# Patient Record
Sex: Female | Born: 1945 | Race: White | Hispanic: No | Marital: Married | Smoking: Never smoker
Health system: Southern US, Community
[De-identification: ages and names within clinical notes are randomized; demographics above are authoritative.]

## PROBLEM LIST (undated history)

## (undated) DIAGNOSIS — I2699 Other pulmonary embolism without acute cor pulmonale: Secondary | ICD-10-CM

## (undated) DIAGNOSIS — S32020A Wedge compression fracture of second lumbar vertebra, initial encounter for closed fracture: Secondary | ICD-10-CM

## (undated) DIAGNOSIS — R7303 Prediabetes: Secondary | ICD-10-CM

## (undated) DIAGNOSIS — I700 Atherosclerosis of aorta: Secondary | ICD-10-CM

## (undated) DIAGNOSIS — I10 Essential (primary) hypertension: Secondary | ICD-10-CM

## (undated) DIAGNOSIS — T8859XA Other complications of anesthesia, initial encounter: Secondary | ICD-10-CM

## (undated) DIAGNOSIS — I4510 Unspecified right bundle-branch block: Secondary | ICD-10-CM

## (undated) DIAGNOSIS — M48061 Spinal stenosis, lumbar region without neurogenic claudication: Secondary | ICD-10-CM

## (undated) DIAGNOSIS — G709 Myoneural disorder, unspecified: Secondary | ICD-10-CM

## (undated) DIAGNOSIS — E785 Hyperlipidemia, unspecified: Secondary | ICD-10-CM

## (undated) DIAGNOSIS — R911 Solitary pulmonary nodule: Secondary | ICD-10-CM

## (undated) HISTORY — PX: CATARACT EXTRACTION W/ INTRAOCULAR LENS IMPLANT: SHX1309

## (undated) HISTORY — PX: WRIST FRACTURE SURGERY: SHX121

## (undated) HISTORY — PX: ANKLE FRACTURE SURGERY: SHX122

## (undated) HISTORY — PX: TONSILLECTOMY: SUR1361

## (undated) HISTORY — PX: EYE SURGERY: SHX253

## (undated) HISTORY — PX: LUMBAR FUSION: SHX111

---

## 2005-10-25 HISTORY — PX: WRIST SURGERY: SHX841

## 2006-10-01 ENCOUNTER — Emergency Department (HOSPITAL_COMMUNITY): Admission: EM | Admit: 2006-10-01 | Discharge: 2006-10-01 | Payer: Self-pay | Admitting: Emergency Medicine

## 2006-10-01 IMAGING — CT CT L SPINE W/O CM
3 of 4 series · 15 of 33 positions shown, 18 images · non-contrast
Comparison: none
COMPARISON: none
COMPARISON: none

CLINICAL DATA: Patient fell off a horse.
 RIGHT WRIST- 4 VIEWS:
 There is a Colles type fracture of the distal radius with dorsal impaction and slight displacement.  There is also a nondisplaced fracture through the base of the ulnar styloid.  Carpal bones are intact.
TECHNIQUE: Contiguous axial images were obtained from the base of the skull through the vertex according to standard protocol without contrast.
TECHNIQUE: Multidetector CT imaging of the cervical spine was performed.  Multiplanar CT image reconstructions were also generated.
TECHNIQUE: Multidetector CT imaging of the lumbar spine was performed.  Multiplanar CT image reconstructions were also generated.

[Series 5: l_spine 2.0 b30s st · axial · 0.26mm/px · z∈[+675,+858]mm · 7 of 169 slices shown, 9 images]
[im 19/169  soft-tissue]
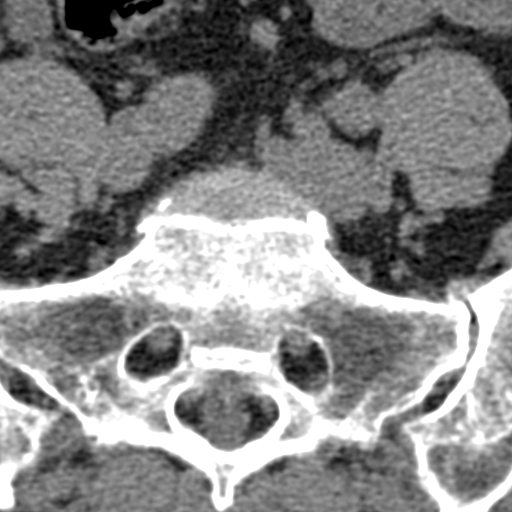
[im 19/169  bone]
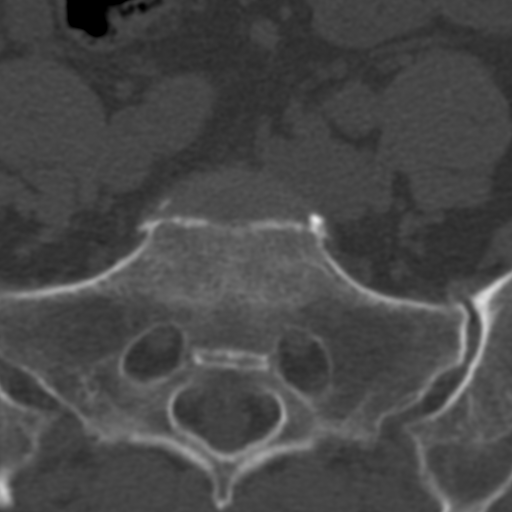
[im 38/169  bone]
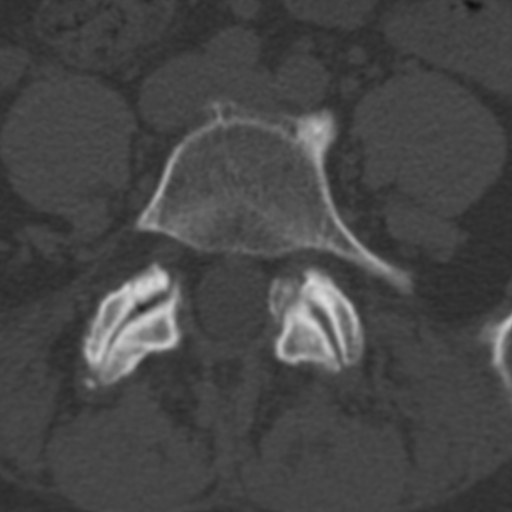
[im 57/169  bone]
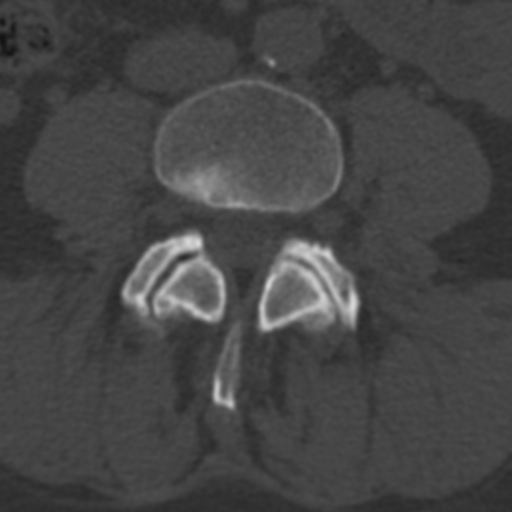
[im 94/169  bone]
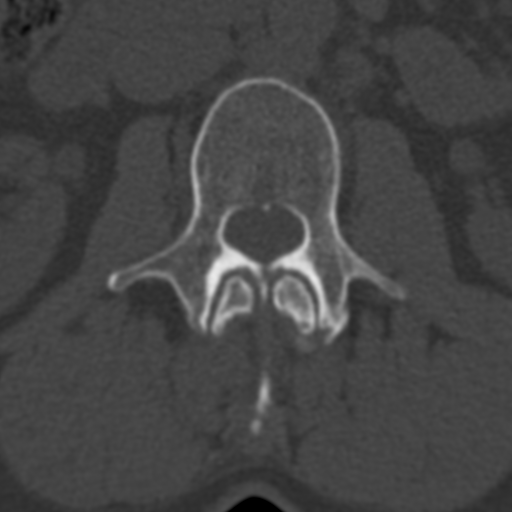
[im 113/169  soft-tissue]
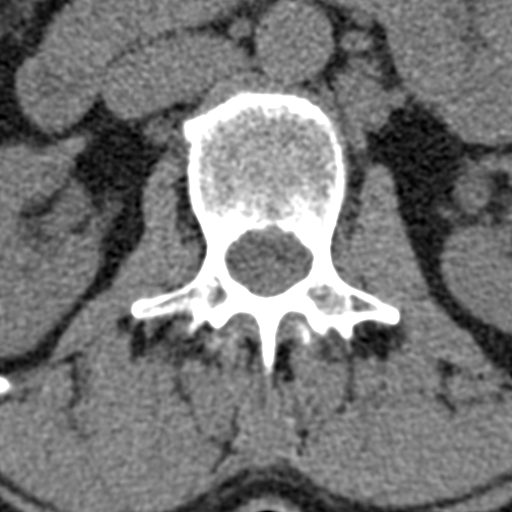
[im 113/169  bone]
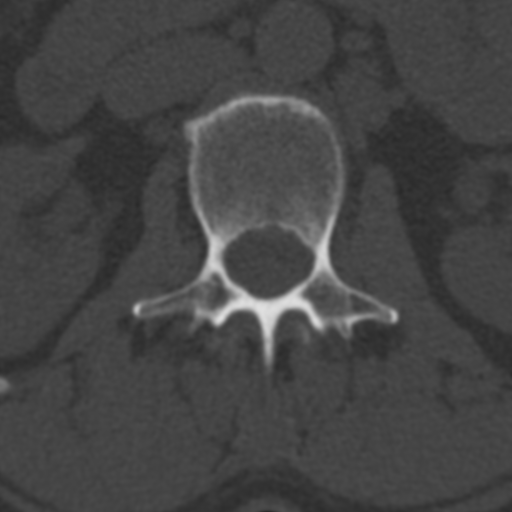
[im 131/169  bone]
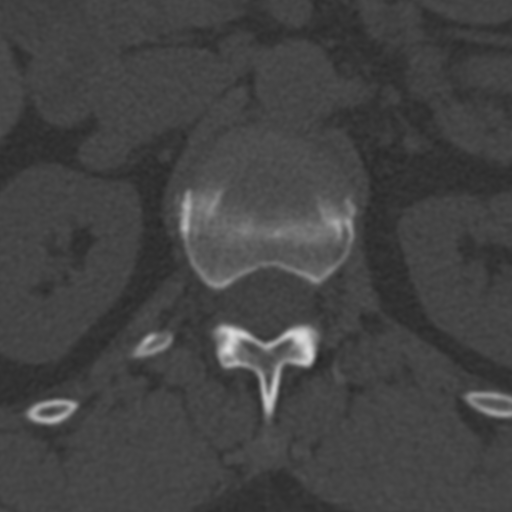
[im 150/169  bone]
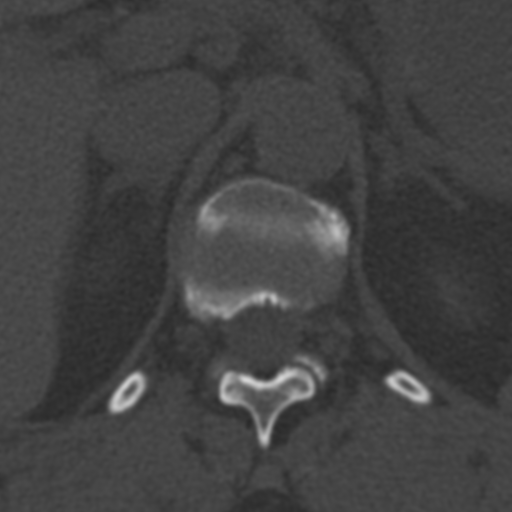

[Series 602: sagittal · sagittal · 0.46mm/px · 5 of 30 slices shown, 6 images]
[im 10/30  bone]
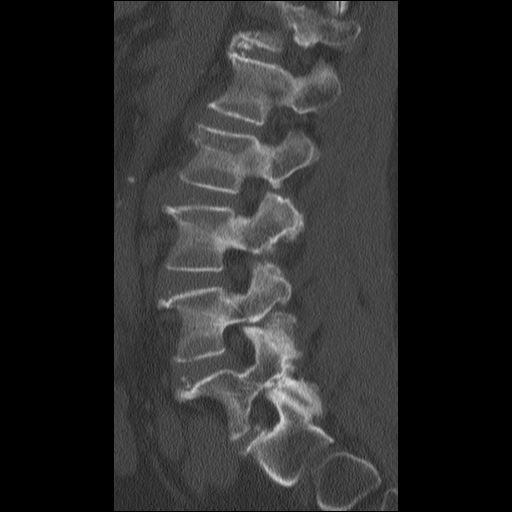
[im 13/30  bone]
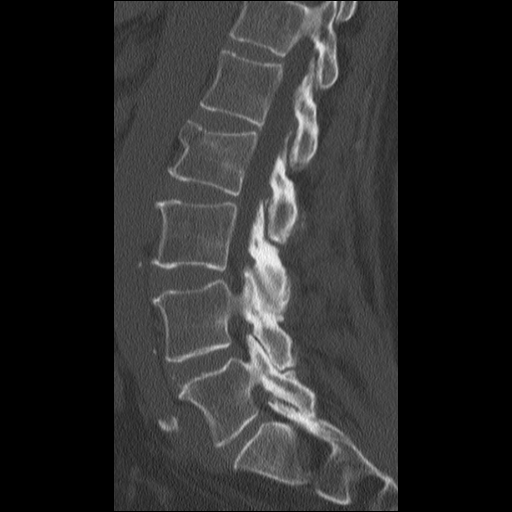
[im 15/30  soft-tissue]
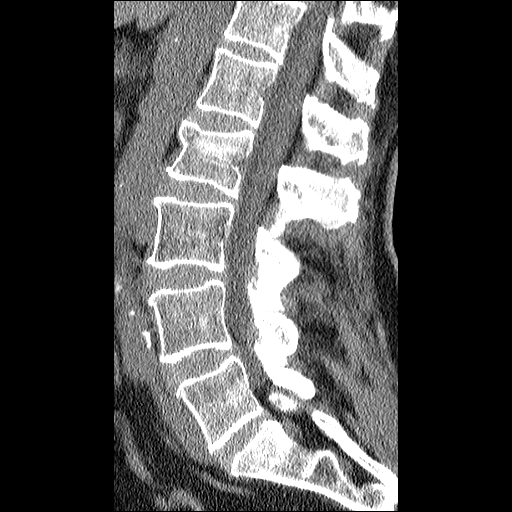
[im 15/30  bone]
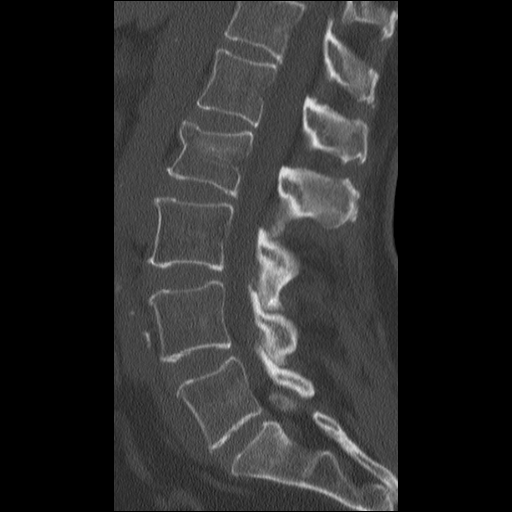
[im 17/30  bone]
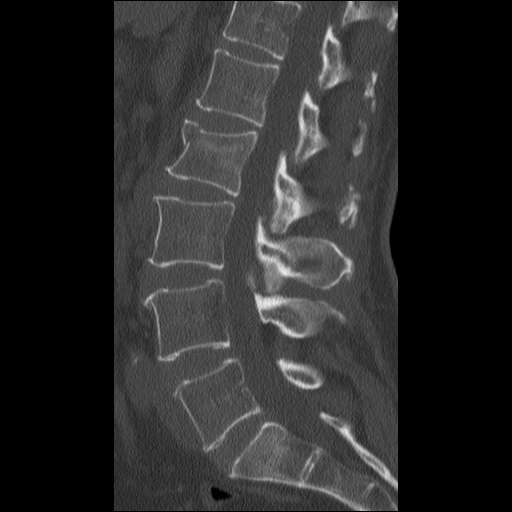
[im 20/30  bone]
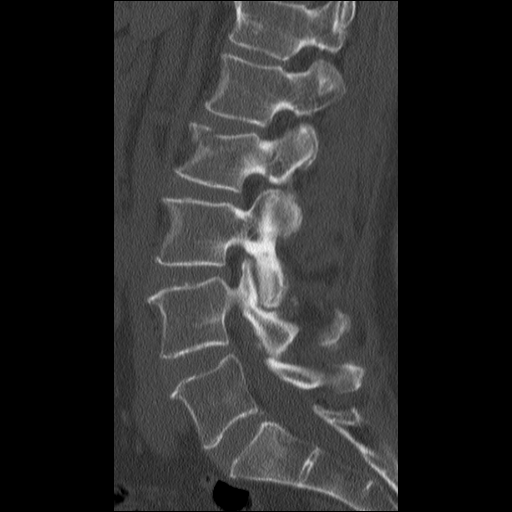

[Series 603: coronal · coronal · 0.46mm/px · 3 of 30 slices shown]
[im 6/30  bone]
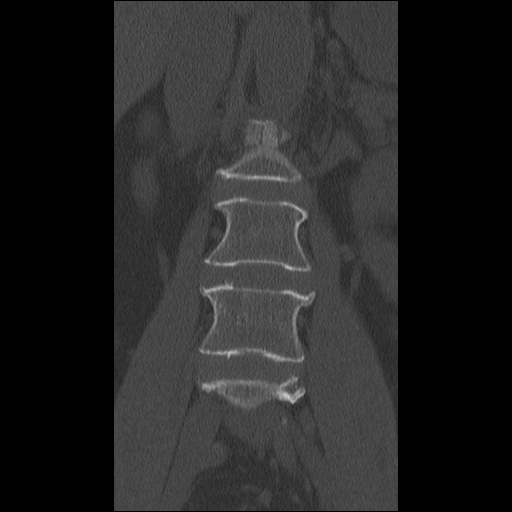
[im 12/30  bone]
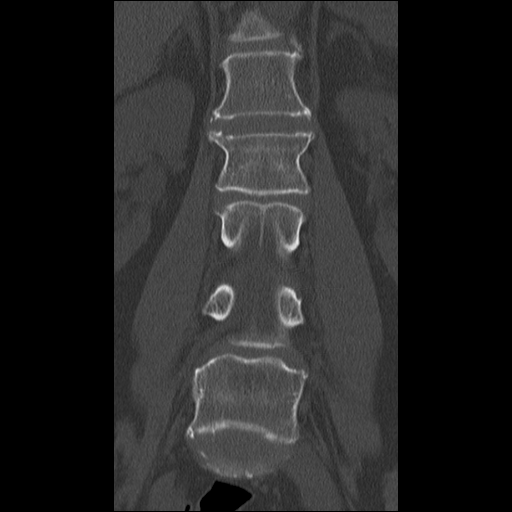
[im 18/30  bone]
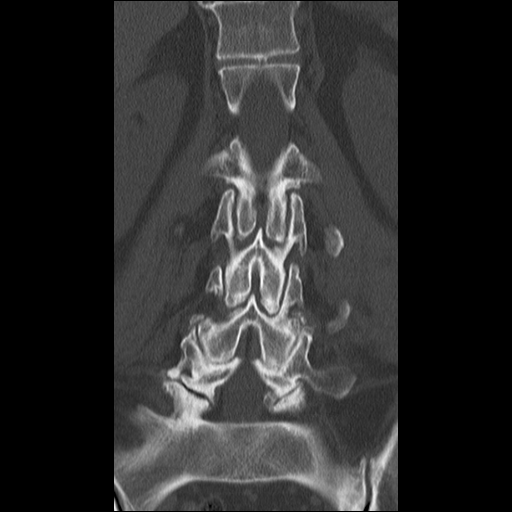

[15 of 33 positions shown; findings below may reference images not displayed]

IMPRESSION: Dorsally impacted distal radius fracture and a nondisplaced fracture through the base of the ulnar styloid.
 HEAD CT WITHOUT CONTRAST:
FINDINGS: There is no evidence of intracranial hemorrhage, brain edema, acute 
 infarct, mass lesion, or mass effect.  No other intra-axial abnormalities 
 are seen, and the ventricles are within normal limits.  No abnormal 
 extra-axial fluid collections or masses are identified.  No skull 
 abnormalities are noted.
IMPRESSION: Negative non-contrast head CT.
 CERVICAL SPINE CT WITHOUT CONTRAST:
FINDINGS: Normal alignment of the cervical vertebral bodies.  There is degenerative spurring change anteriorly with large osteophytes at C5, C6, and C7.  The facets are normally aligned.  Mild bony foraminal narrowing particularly at C5-6 and C6-7.  No fractures.  No abnormal prevertebral soft tissue swelling.  The skull [13], and C1-C2 articulations are normal.  The dens is intact.
IMPRESSION: 1.  Normal alignment and no acute bony findings.  
 2.  Degenerative change with osteophytic spurring.  There is mild bony foraminal narrowing at C5-6 and C6-7 bilaterally.  
 LUMBAR SPINE CT WITHOUT CONTRAST:
FINDINGS: There is a compression fracture of the L2 vertebral body mainly through the anterior superior endplate.  The facets are normally aligned.  No pars defects.  Small amount of paraspinal hematoma.  No canal compromise.  There is no fracture of the posterior aspect of the vertebral body and no retropulsion.  No acute disc herniation.  Remaining lumbar vertebral bodies are maintained.  There is a broad-based disc protrusion at L3-4 and this in combination with facet disease creates spinal and lateral recess stenosis.
 Moderate facet degenerative changes in the lower lumbar spine.
IMPRESSION: 1.  L2 fracture through the superior anterior aspect of the vertebral body.  No retropulsion or canal compromise.  
 2.  Facet degenerative changes but no facet fractures or pars defects.
 3.  Moderate spinal and lateral recess stenosis at L3-4.

## 2006-10-01 IMAGING — CT CT CERVICAL SPINE W/O CM
3 of 4 series · 15 of 33 positions shown, 18 images · non-contrast
Comparison: none
COMPARISON: none
COMPARISON: none

CLINICAL DATA: Patient fell off a horse.
 RIGHT WRIST- 4 VIEWS:
 There is a Colles type fracture of the distal radius with dorsal impaction and slight displacement.  There is also a nondisplaced fracture through the base of the ulnar styloid.  Carpal bones are intact.
TECHNIQUE: Contiguous axial images were obtained from the base of the skull through the vertex according to standard protocol without contrast.
TECHNIQUE: Multidetector CT imaging of the cervical spine was performed.  Multiplanar CT image reconstructions were also generated.
TECHNIQUE: Multidetector CT imaging of the lumbar spine was performed.  Multiplanar CT image reconstructions were also generated.

[Series 4: c_spine 2.0 b20s · axial · 0.23mm/px · z∈[+1114,+1246]mm · 7 of 86 slices shown, 9 images]
[im 10/86  soft-tissue]
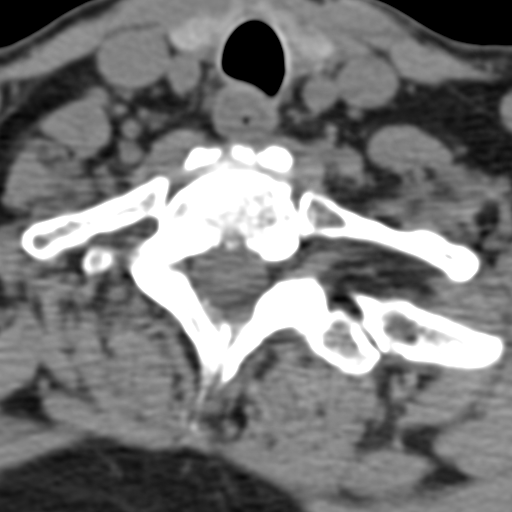
[im 10/86  bone]
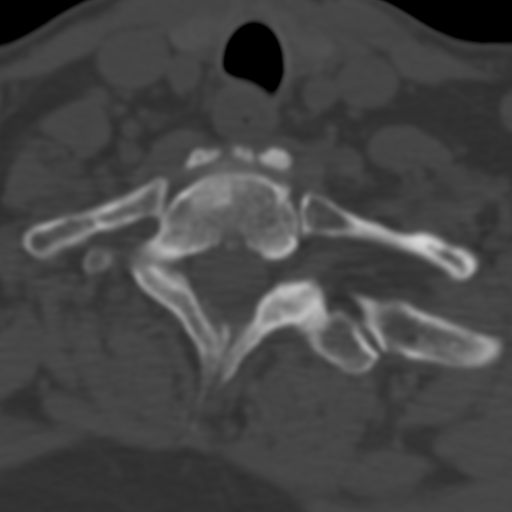
[im 19/86  bone]
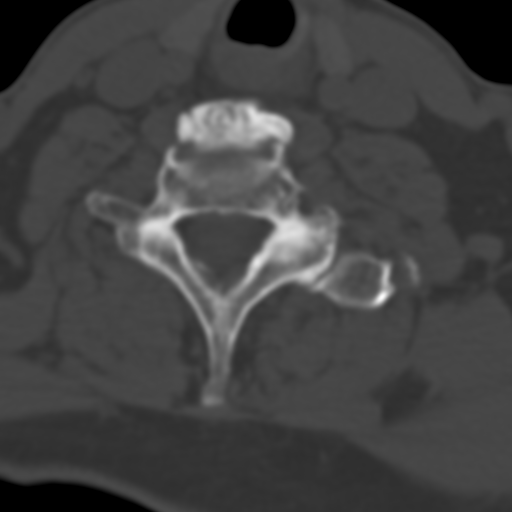
[im 29/86  bone]
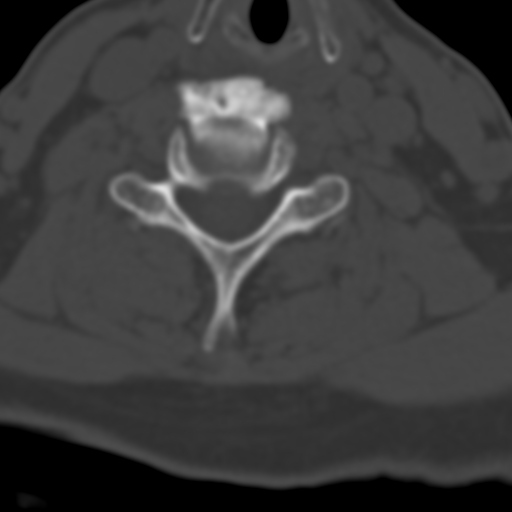
[im 48/86  bone]
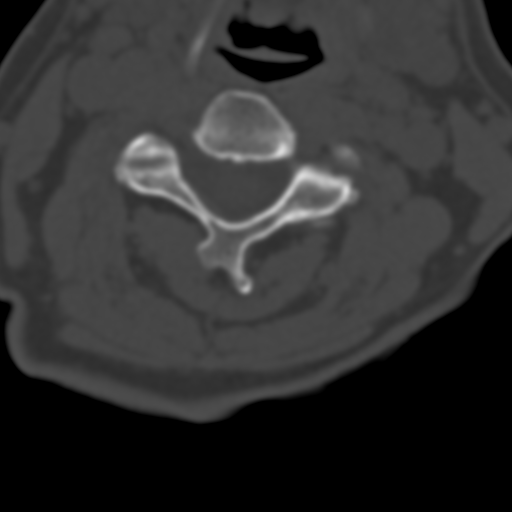
[im 57/86  soft-tissue]
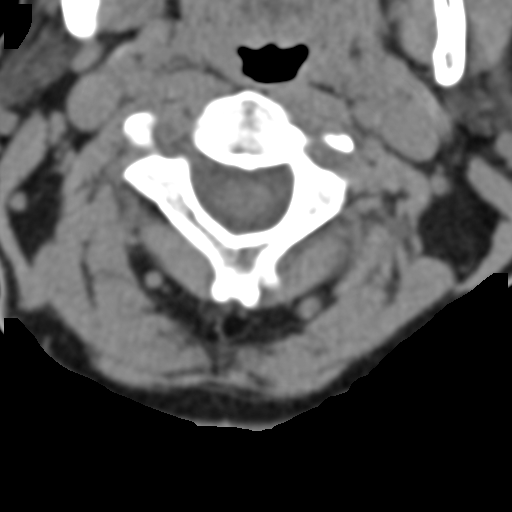
[im 57/86  bone]
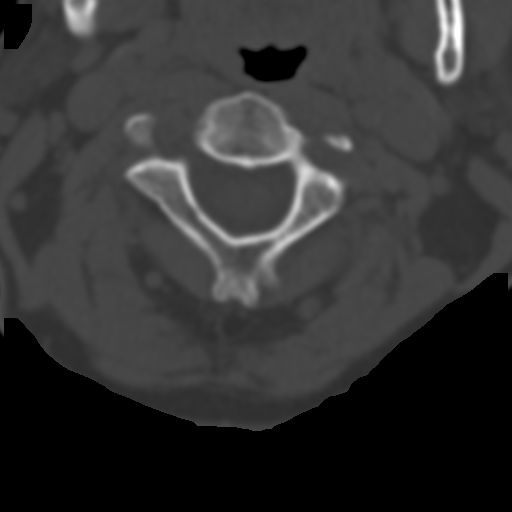
[im 67/86  bone]
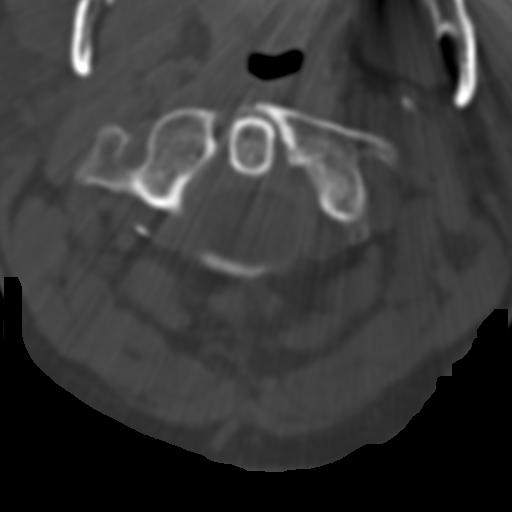
[im 76/86  bone]
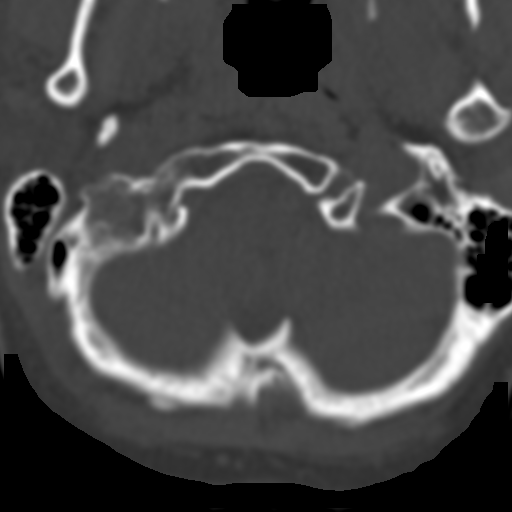

[Series 602: coronal · coronal · 0.33mm/px · 3 of 28 slices shown]
[im 6/28  bone]
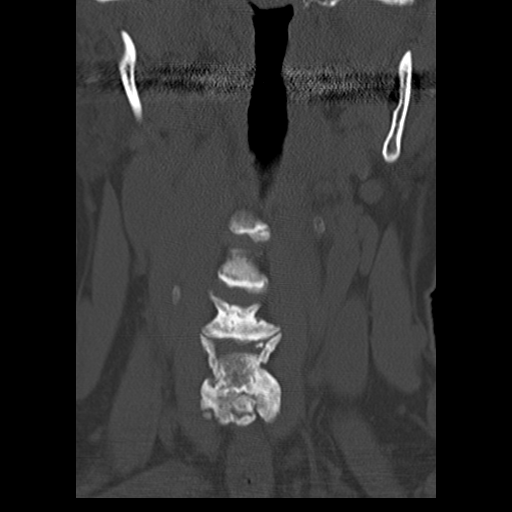
[im 11/28  bone]
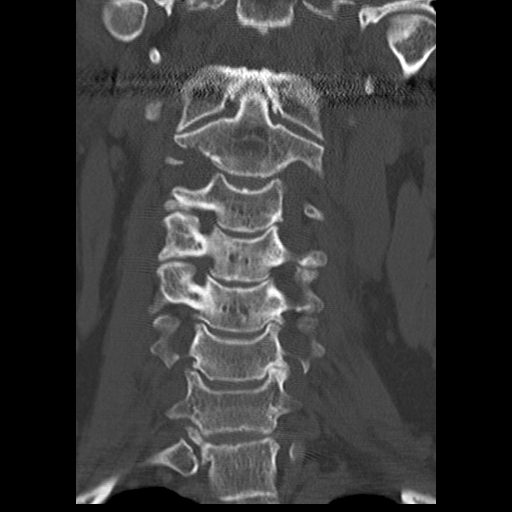
[im 17/28  bone]
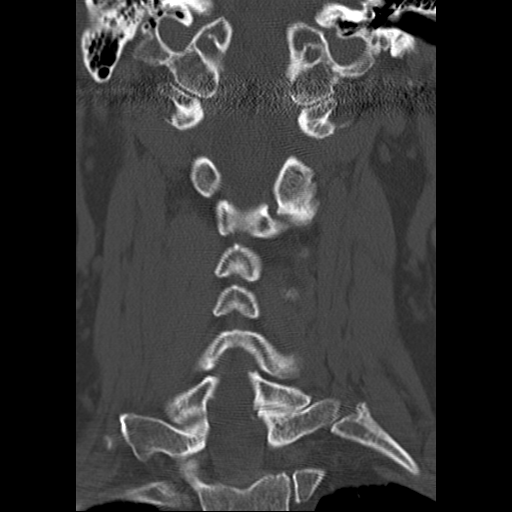

[Series 603: sagittal · sagittal · 0.33mm/px · 5 of 25 slices shown, 6 images]
[im 9/25  bone]
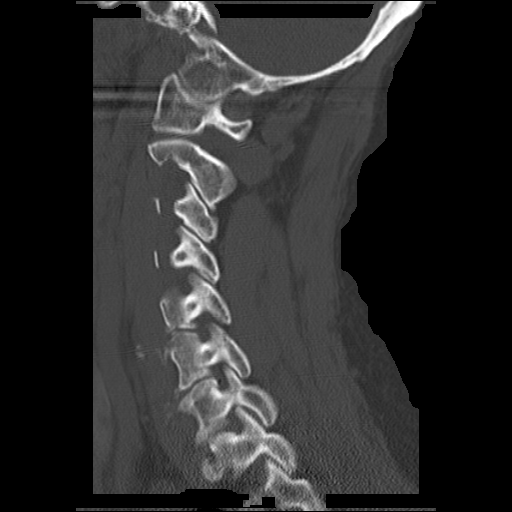
[im 11/25  bone]
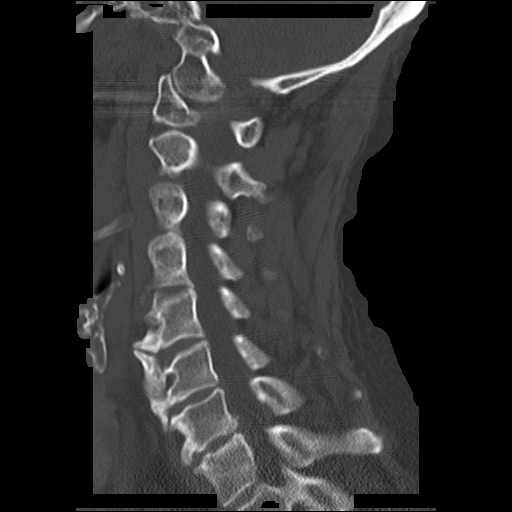
[im 13/25  soft-tissue]
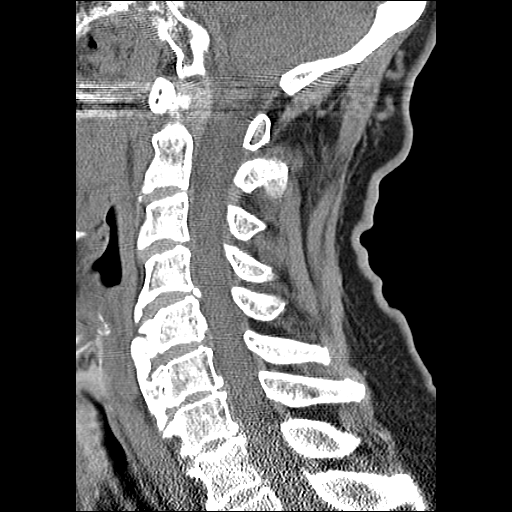
[im 13/25  bone]
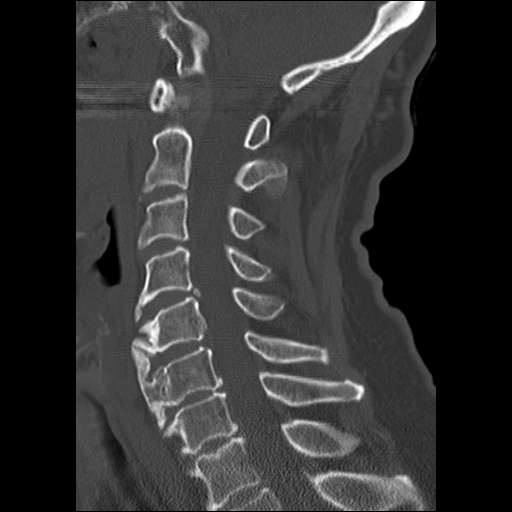
[im 15/25  bone]
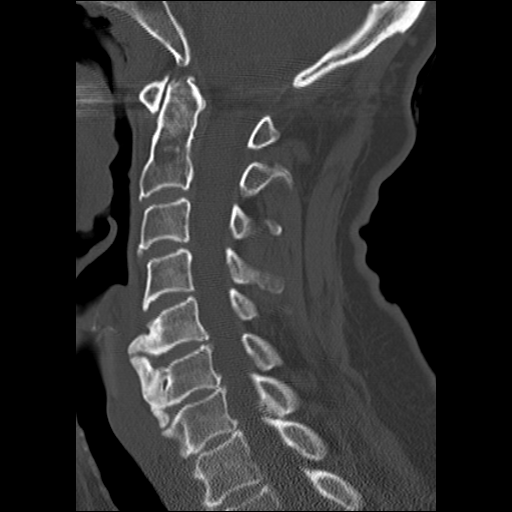
[im 17/25  bone]
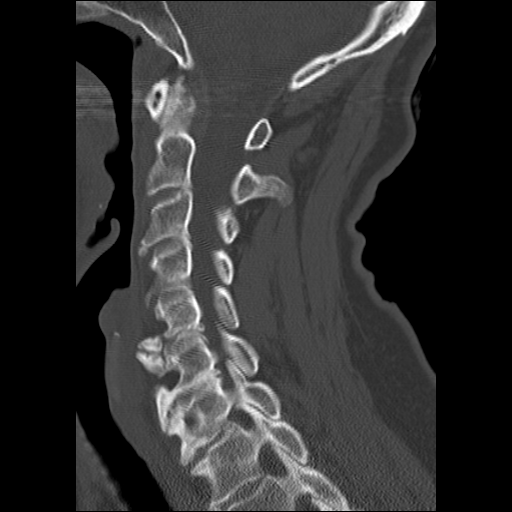

[15 of 33 positions shown; findings below may reference images not displayed]

IMPRESSION: Dorsally impacted distal radius fracture and a nondisplaced fracture through the base of the ulnar styloid.
 HEAD CT WITHOUT CONTRAST:
FINDINGS: There is no evidence of intracranial hemorrhage, brain edema, acute 
 infarct, mass lesion, or mass effect.  No other intra-axial abnormalities 
 are seen, and the ventricles are within normal limits.  No abnormal 
 extra-axial fluid collections or masses are identified.  No skull 
 abnormalities are noted.
IMPRESSION: Negative non-contrast head CT.
 CERVICAL SPINE CT WITHOUT CONTRAST:
FINDINGS: Normal alignment of the cervical vertebral bodies.  There is degenerative spurring change anteriorly with large osteophytes at C5, C6, and C7.  The facets are normally aligned.  Mild bony foraminal narrowing particularly at C5-6 and C6-7.  No fractures.  No abnormal prevertebral soft tissue swelling.  The skull [13], and C1-C2 articulations are normal.  The dens is intact.
IMPRESSION: 1.  Normal alignment and no acute bony findings.  
 2.  Degenerative change with osteophytic spurring.  There is mild bony foraminal narrowing at C5-6 and C6-7 bilaterally.  
 LUMBAR SPINE CT WITHOUT CONTRAST:
FINDINGS: There is a compression fracture of the L2 vertebral body mainly through the anterior superior endplate.  The facets are normally aligned.  No pars defects.  Small amount of paraspinal hematoma.  No canal compromise.  There is no fracture of the posterior aspect of the vertebral body and no retropulsion.  No acute disc herniation.  Remaining lumbar vertebral bodies are maintained.  There is a broad-based disc protrusion at L3-4 and this in combination with facet disease creates spinal and lateral recess stenosis.
 Moderate facet degenerative changes in the lower lumbar spine.
IMPRESSION: 1.  L2 fracture through the superior anterior aspect of the vertebral body.  No retropulsion or canal compromise.  
 2.  Facet degenerative changes but no facet fractures or pars defects.
 3.  Moderate spinal and lateral recess stenosis at L3-4.

## 2006-10-01 IMAGING — CR DG WRIST COMPLETE 3+V*R*
4 series · 4 of 4 positions shown · non-contrast
Comparison: none
COMPARISON: none
COMPARISON: none

CLINICAL DATA: Patient fell off a horse.
 RIGHT WRIST- 4 VIEWS:
 There is a Colles type fracture of the distal radius with dorsal impaction and slight displacement.  There is also a nondisplaced fracture through the base of the ulnar styloid.  Carpal bones are intact.
TECHNIQUE: Contiguous axial images were obtained from the base of the skull through the vertex according to standard protocol without contrast.
TECHNIQUE: Multidetector CT imaging of the cervical spine was performed.  Multiplanar CT image reconstructions were also generated.
TECHNIQUE: Multidetector CT imaging of the lumbar spine was performed.  Multiplanar CT image reconstructions were also generated.

[x wrist pa right]
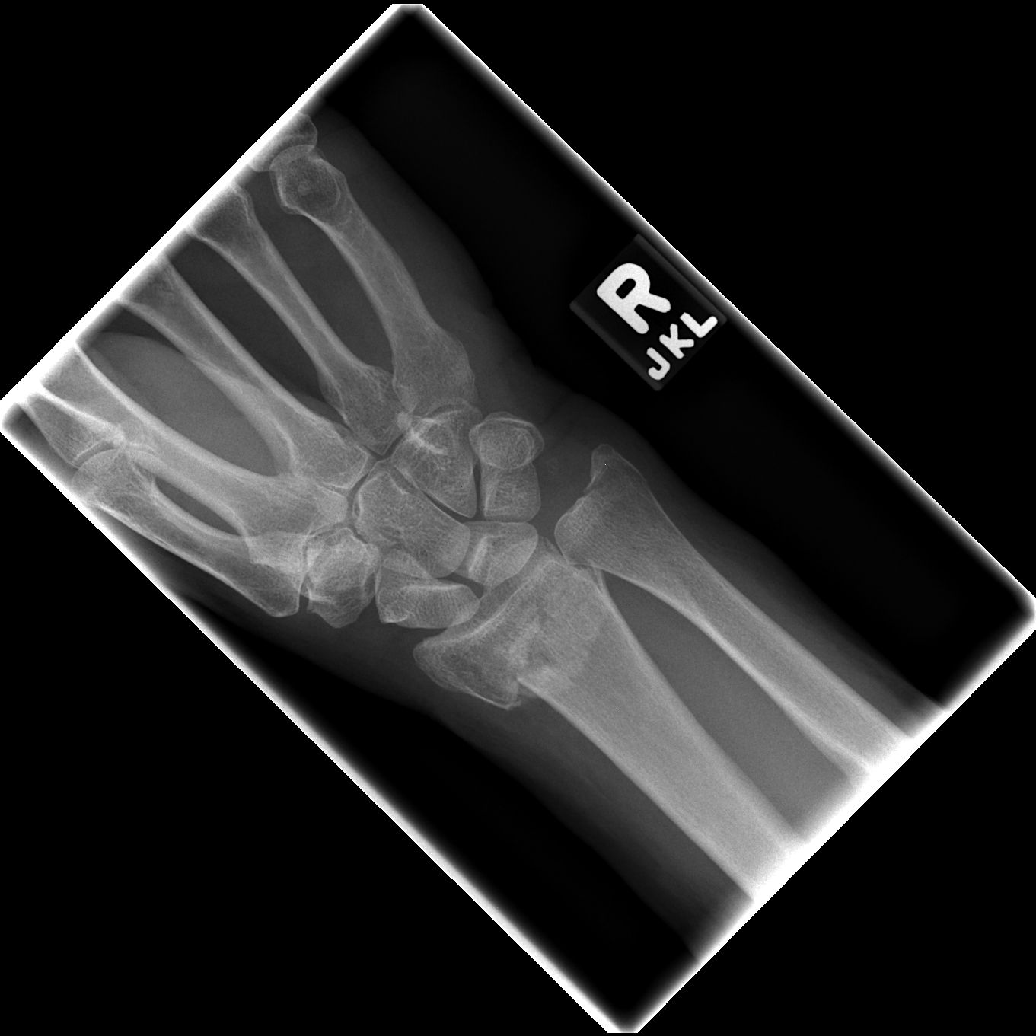

[x wrist obl right]
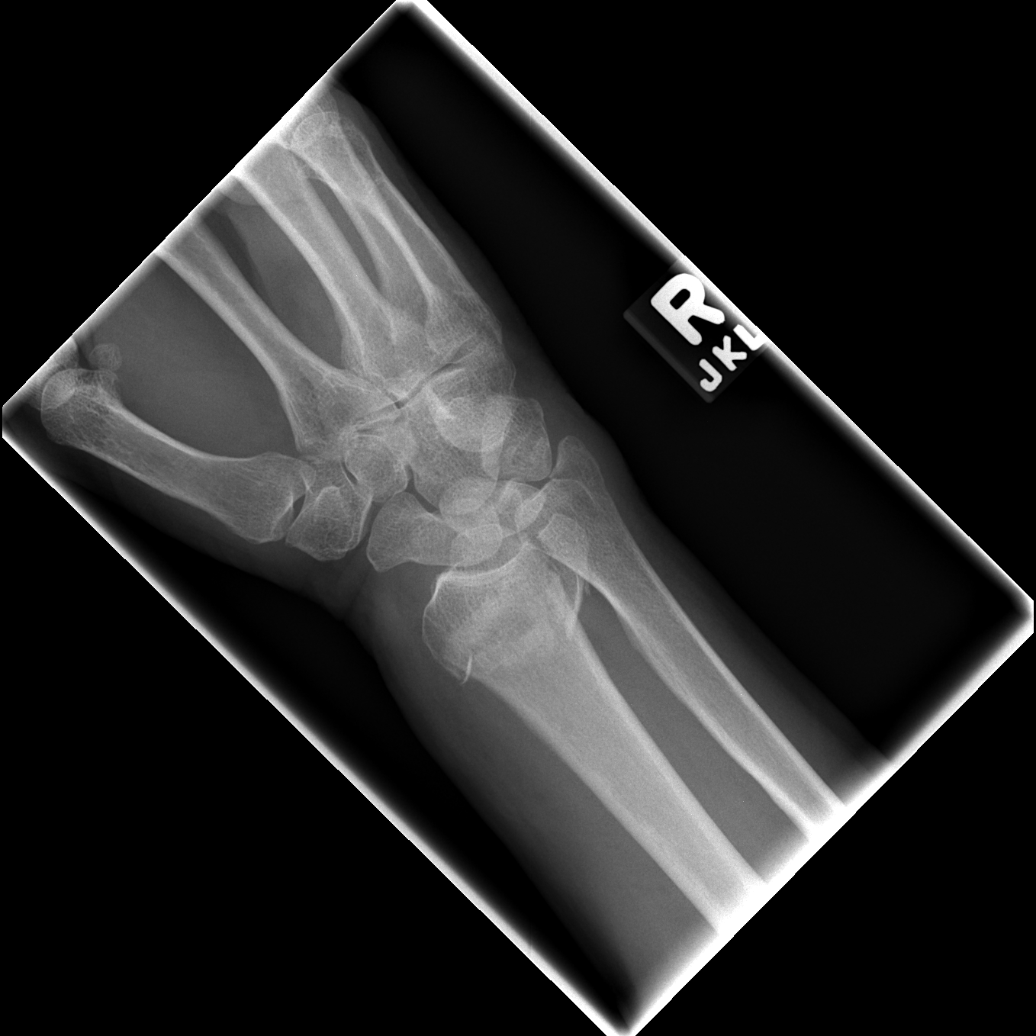

[x wrist lat right]
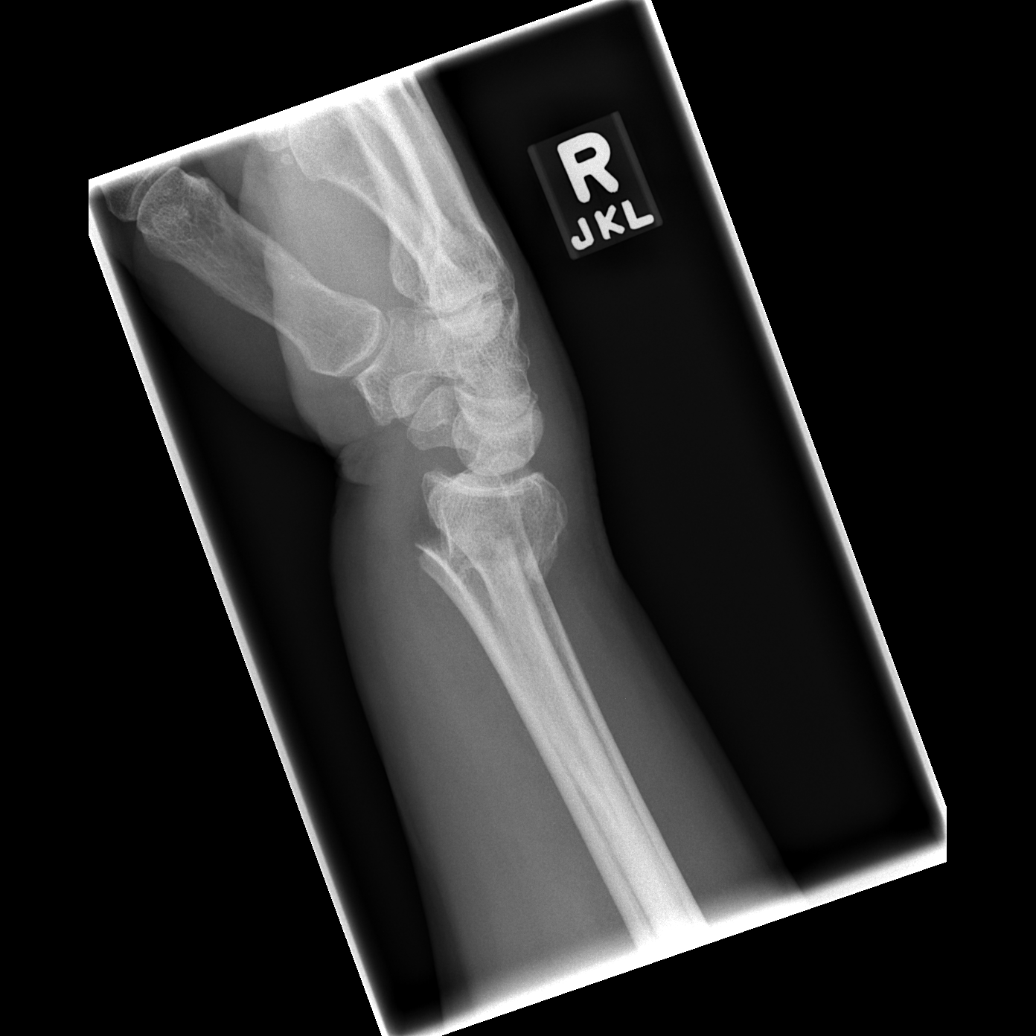

[x wrist navicular]
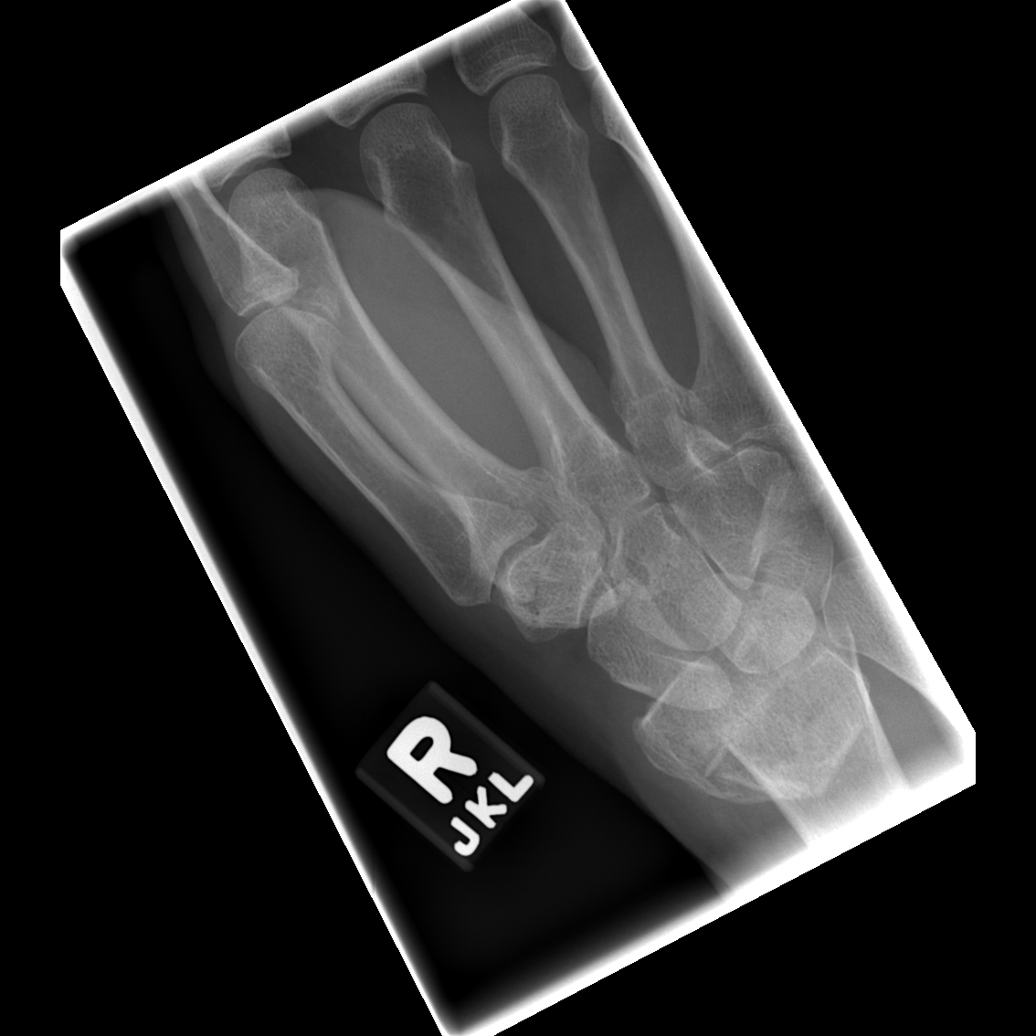

[4 of 4 positions shown; findings below may reference images not displayed]

IMPRESSION: Dorsally impacted distal radius fracture and a nondisplaced fracture through the base of the ulnar styloid.
 HEAD CT WITHOUT CONTRAST:
FINDINGS: There is no evidence of intracranial hemorrhage, brain edema, acute 
 infarct, mass lesion, or mass effect.  No other intra-axial abnormalities 
 are seen, and the ventricles are within normal limits.  No abnormal 
 extra-axial fluid collections or masses are identified.  No skull 
 abnormalities are noted.
IMPRESSION: Negative non-contrast head CT.
 CERVICAL SPINE CT WITHOUT CONTRAST:
FINDINGS: Normal alignment of the cervical vertebral bodies.  There is degenerative spurring change anteriorly with large osteophytes at C5, C6, and C7.  The facets are normally aligned.  Mild bony foraminal narrowing particularly at C5-6 and C6-7.  No fractures.  No abnormal prevertebral soft tissue swelling.  The skull [13], and C1-C2 articulations are normal.  The dens is intact.
IMPRESSION: 1.  Normal alignment and no acute bony findings.  
 2.  Degenerative change with osteophytic spurring.  There is mild bony foraminal narrowing at C5-6 and C6-7 bilaterally.  
 LUMBAR SPINE CT WITHOUT CONTRAST:
FINDINGS: There is a compression fracture of the L2 vertebral body mainly through the anterior superior endplate.  The facets are normally aligned.  No pars defects.  Small amount of paraspinal hematoma.  No canal compromise.  There is no fracture of the posterior aspect of the vertebral body and no retropulsion.  No acute disc herniation.  Remaining lumbar vertebral bodies are maintained.  There is a broad-based disc protrusion at L3-4 and this in combination with facet disease creates spinal and lateral recess stenosis.
 Moderate facet degenerative changes in the lower lumbar spine.
IMPRESSION: 1.  L2 fracture through the superior anterior aspect of the vertebral body.  No retropulsion or canal compromise.  
 2.  Facet degenerative changes but no facet fractures or pars defects.
 3.  Moderate spinal and lateral recess stenosis at L3-4.

## 2006-10-04 ENCOUNTER — Ambulatory Visit (HOSPITAL_BASED_OUTPATIENT_CLINIC_OR_DEPARTMENT_OTHER): Admission: RE | Admit: 2006-10-04 | Discharge: 2006-10-04 | Payer: Self-pay | Admitting: Orthopaedic Surgery

## 2011-10-26 ENCOUNTER — Ambulatory Visit (INDEPENDENT_AMBULATORY_CARE_PROVIDER_SITE_OTHER): Payer: Medicare Other

## 2011-10-26 DIAGNOSIS — S61409A Unspecified open wound of unspecified hand, initial encounter: Secondary | ICD-10-CM | POA: Diagnosis not present

## 2011-10-26 DIAGNOSIS — Z23 Encounter for immunization: Secondary | ICD-10-CM

## 2011-10-26 DIAGNOSIS — A280 Pasteurellosis: Secondary | ICD-10-CM

## 2011-11-12 ENCOUNTER — Encounter (INDEPENDENT_AMBULATORY_CARE_PROVIDER_SITE_OTHER): Payer: Medicare Other | Admitting: Family Medicine

## 2011-11-12 DIAGNOSIS — Z0000 Encounter for general adult medical examination without abnormal findings: Secondary | ICD-10-CM

## 2011-11-12 DIAGNOSIS — Z01419 Encounter for gynecological examination (general) (routine) without abnormal findings: Secondary | ICD-10-CM | POA: Diagnosis not present

## 2011-11-12 DIAGNOSIS — E669 Obesity, unspecified: Secondary | ICD-10-CM

## 2011-11-12 DIAGNOSIS — Z124 Encounter for screening for malignant neoplasm of cervix: Secondary | ICD-10-CM

## 2011-11-12 DIAGNOSIS — R030 Elevated blood-pressure reading, without diagnosis of hypertension: Secondary | ICD-10-CM | POA: Diagnosis not present

## 2012-06-14 ENCOUNTER — Telehealth: Payer: Self-pay

## 2012-06-14 NOTE — Telephone Encounter (Signed)
PT HAD LAB WORK DONE AND WOULD LIKE TO COME BY AND PICK UP A COPY OF ALL HER TESTS PLEASE CALL [phone] AND LET PT KNOW IT COULD TAKE UP TO 72 HRS.

## 2012-06-14 NOTE — Telephone Encounter (Signed)
Let patient know that her lab work is ready for pick up.

## 2012-06-23 DIAGNOSIS — R5381 Other malaise: Secondary | ICD-10-CM | POA: Diagnosis not present

## 2012-08-09 ENCOUNTER — Other Ambulatory Visit: Payer: Self-pay | Admitting: Family Medicine

## 2012-08-09 DIAGNOSIS — R1032 Left lower quadrant pain: Secondary | ICD-10-CM

## 2012-08-18 ENCOUNTER — Other Ambulatory Visit: Payer: Self-pay

## 2012-08-22 ENCOUNTER — Other Ambulatory Visit: Payer: Self-pay

## 2012-08-22 ENCOUNTER — Inpatient Hospital Stay: Admission: RE | Admit: 2012-08-22 | Payer: Self-pay | Source: Ambulatory Visit

## 2012-08-25 ENCOUNTER — Ambulatory Visit
Admission: RE | Admit: 2012-08-25 | Discharge: 2012-08-25 | Disposition: A | Discharge status: Home / Self Care | Payer: Self-pay | Source: Ambulatory Visit | Attending: Family Medicine | Admitting: Family Medicine

## 2012-08-25 DIAGNOSIS — R1032 Left lower quadrant pain: Secondary | ICD-10-CM

## 2012-08-25 IMAGING — US US TRANSVAGINAL NON-OB
1 series · 13 of 25 positions shown · non-contrast
Comparison: None

CLINICAL DATA: Left lower quadrant pain for 2 years.  Started
hormone replacement therapy.



[Series 1: us transvaginal non-ob · 0.23mm/px · 13 of 60 slices shown]
[im 1/60]
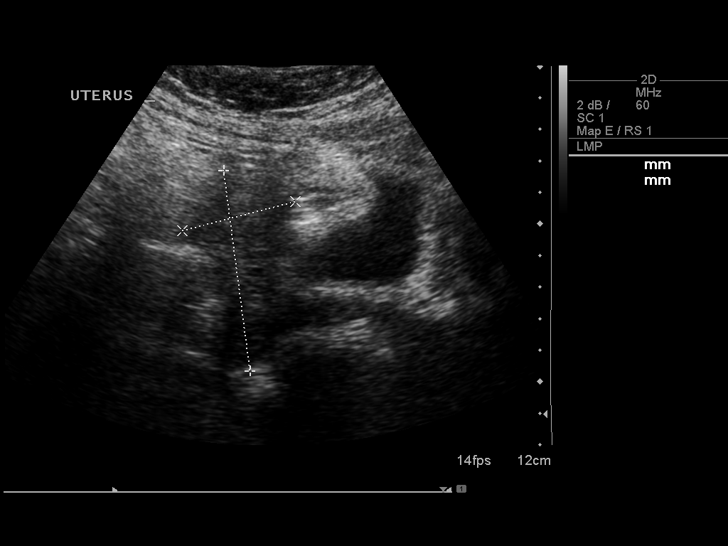
[im 5/60]
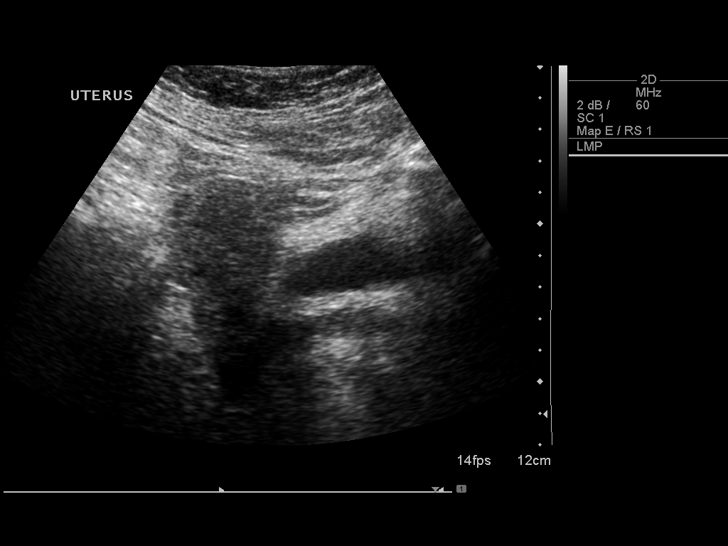
[im 10/60]
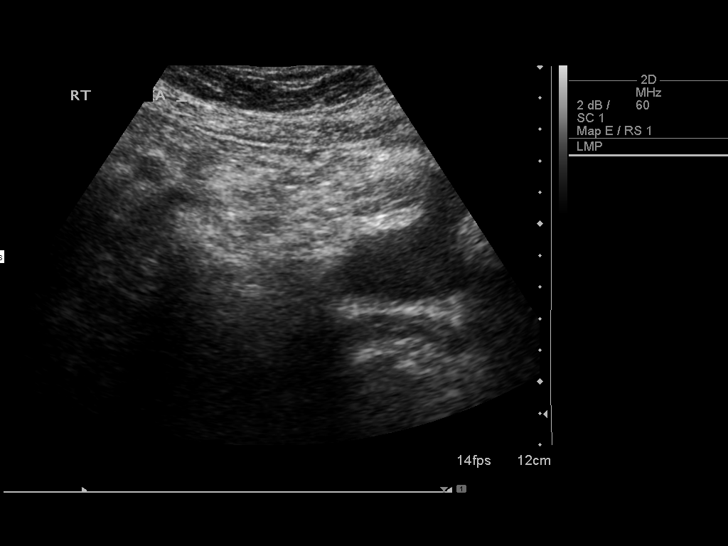
[im 15/60]
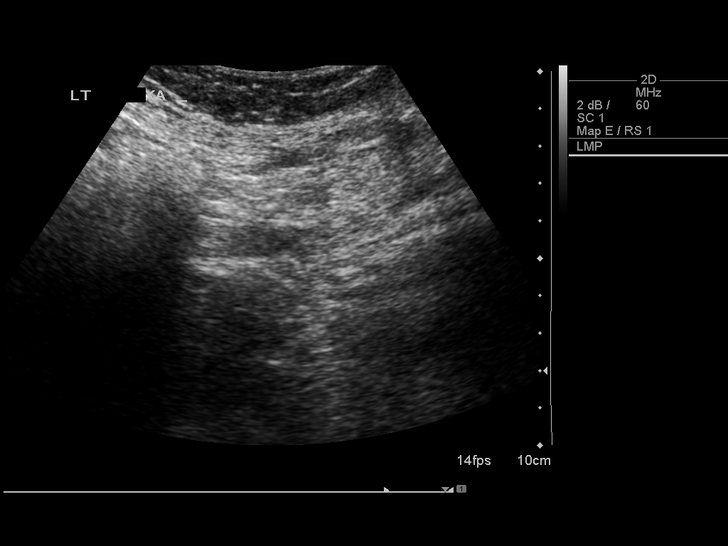
[im 20/60]
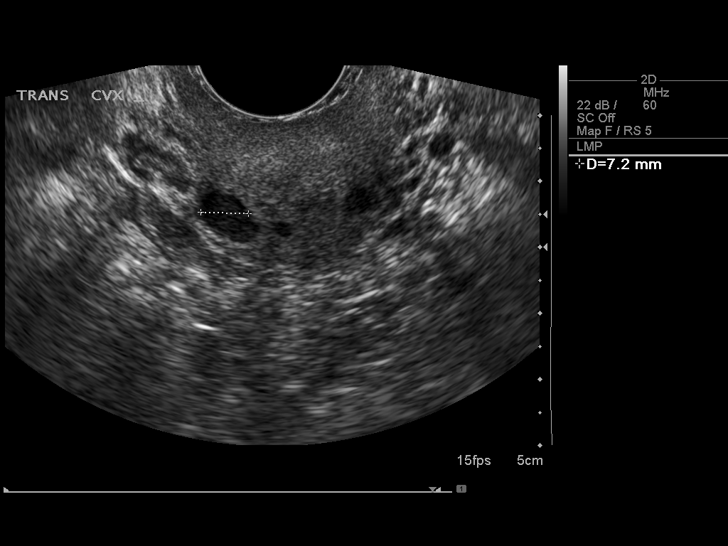
[im 25/60]
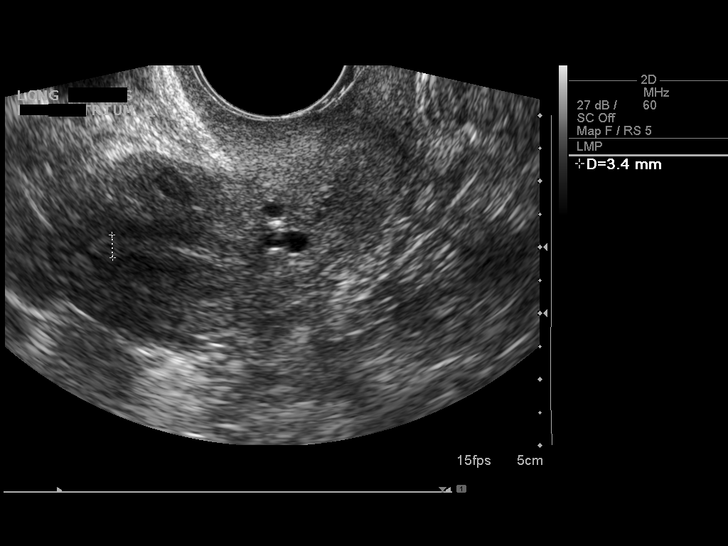
[im 30/60]
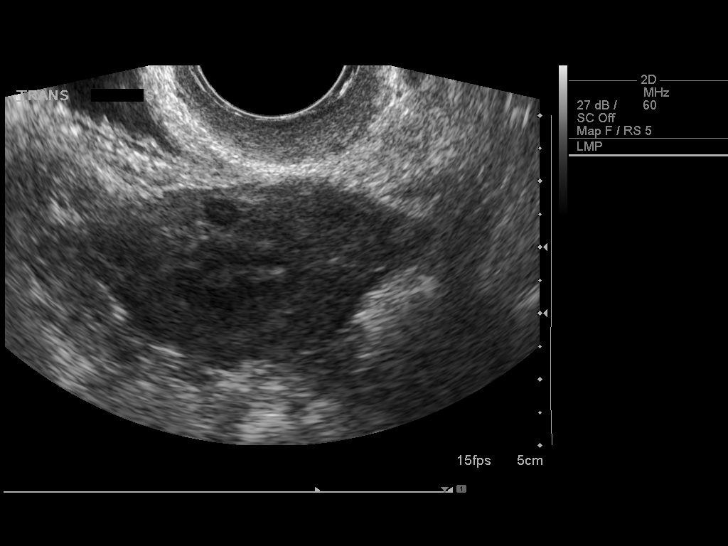
[im 35/60]
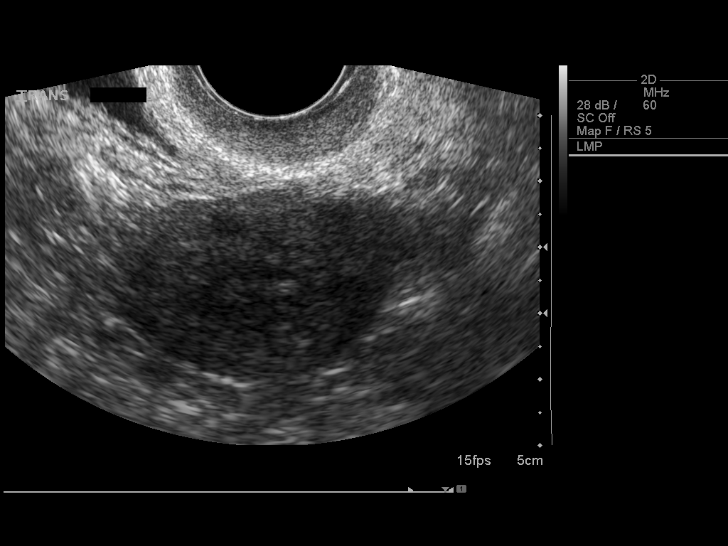
[im 40/60]
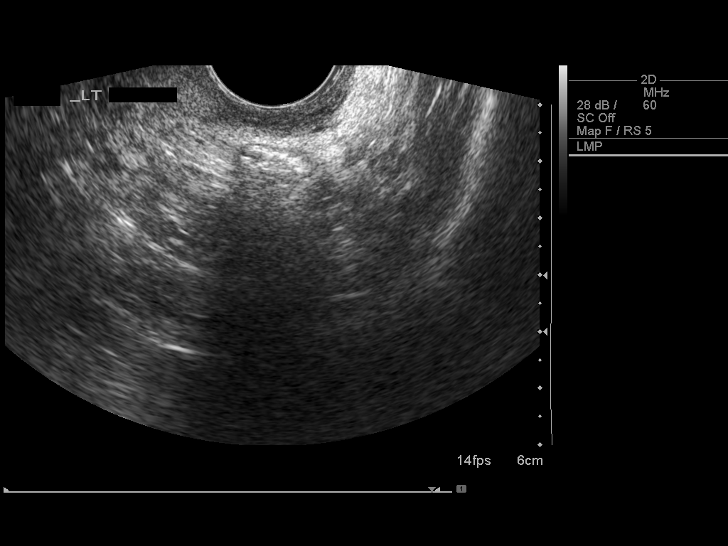
[im 45/60]
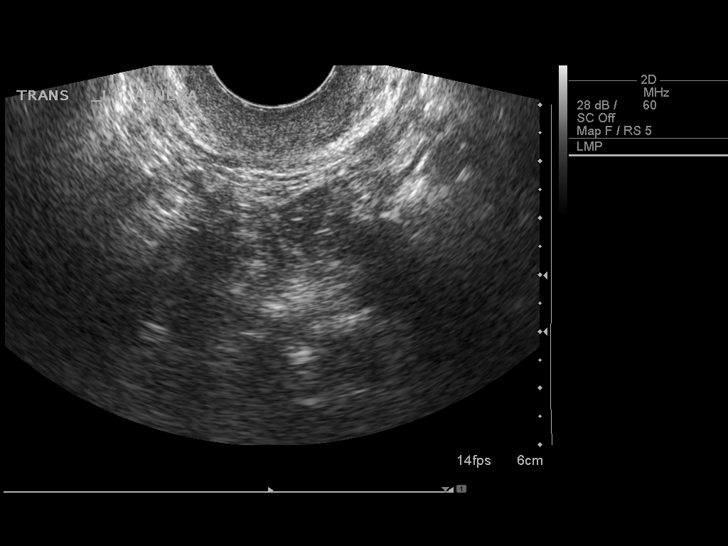
[im 50/60]
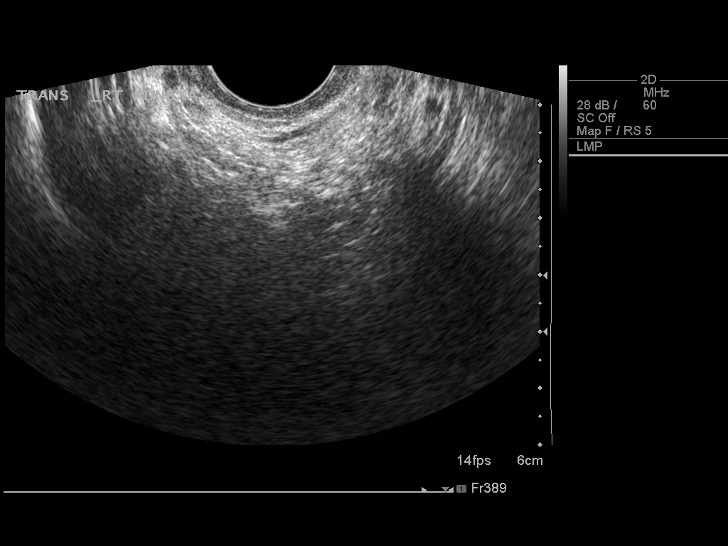
[im 55/60]
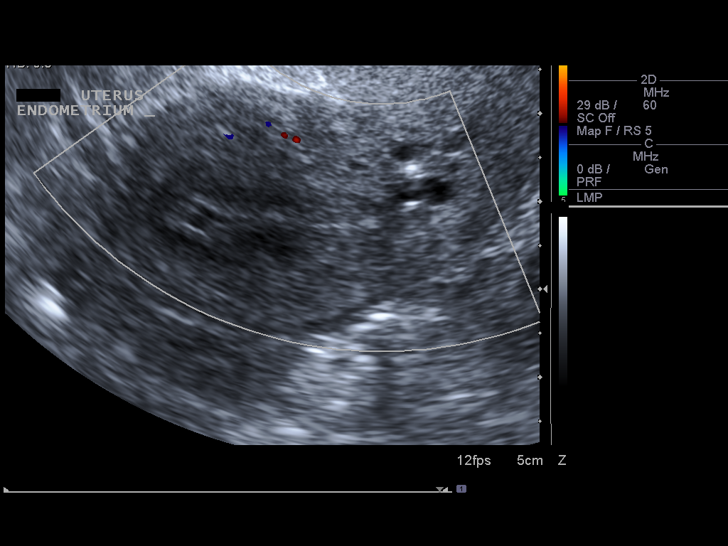
[im 60/60]
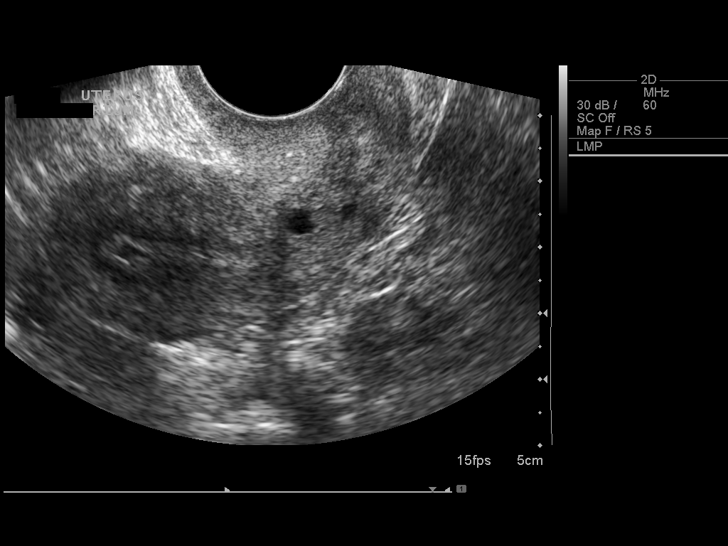

[13 of 25 positions shown; findings below may reference images not displayed]

FINDINGS: Uterus: 6.4 x 3.7 x 3.7 cm.  An anterior uterine body intramural
fibroid measures 7 mm on image 28.  A hypoechoic lesion in the
lower uterine segment/cervix junction measures 1.3 x 0.7 x 0.7 cm.

Endometrium: Normal, maximally 5 mm.

Right ovary:  Poorly visualized due to overlying bowel gas.

Left ovary: Poorly visualized due to overlying bowel gas.

Other findings: No free fluid
IMPRESSION: 1. No acute process or explanation for left lower quadrant pain.
2.  Small uterine fibroid.  A hypoechoic lesion in the cervix/lower
uterine segment junction could represent a second fibroid or a
Nabothian cyst.
3.  Lack of visualization of the ovaries; no evidence of adnexal
mass..

## 2012-08-25 IMAGING — US US ABDOMEN COMPLETE
1 series · 14 of 25 positions shown · non-contrast
Comparison: None.

CLINICAL DATA: Intermittent left lower quadrant pain for 1-2 years

COMPLETE ABDOMINAL ULTRASOUND

[Series 1: us abdomen complete · 0.24mm/px · 14 of 68 slices shown]
[im 1/68]
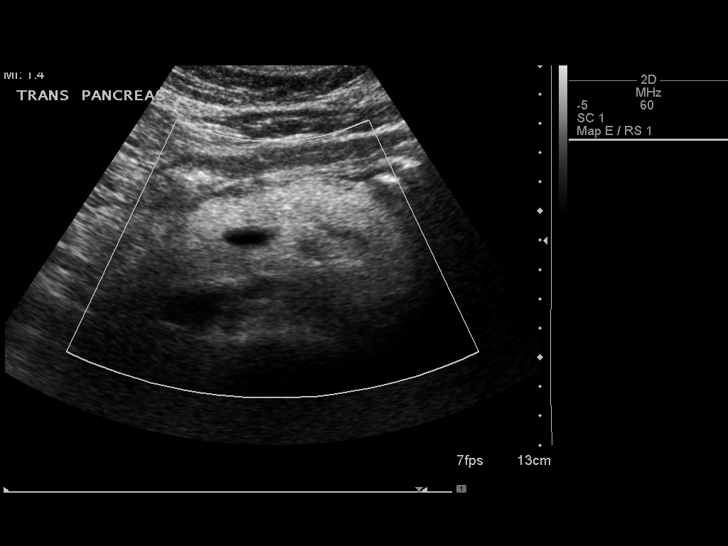
[im 6/68]
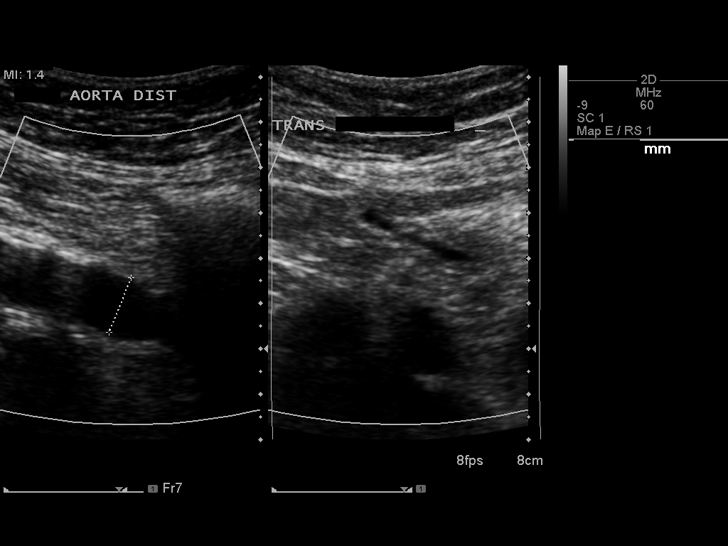
[im 12/68]
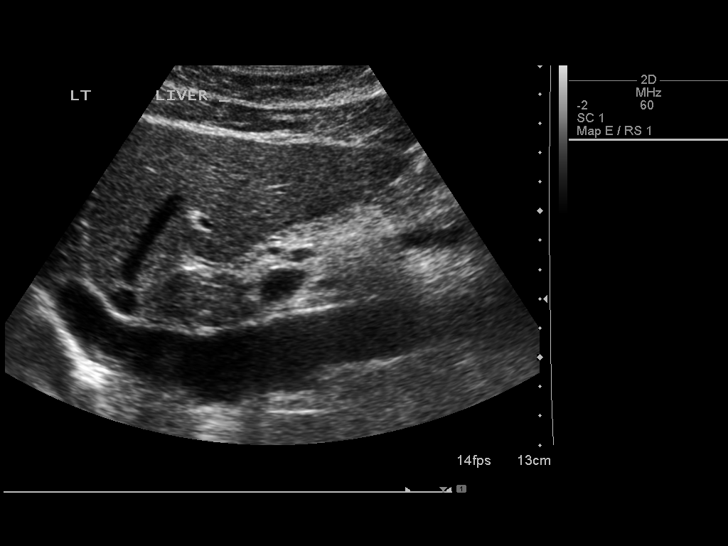
[im 17/68]
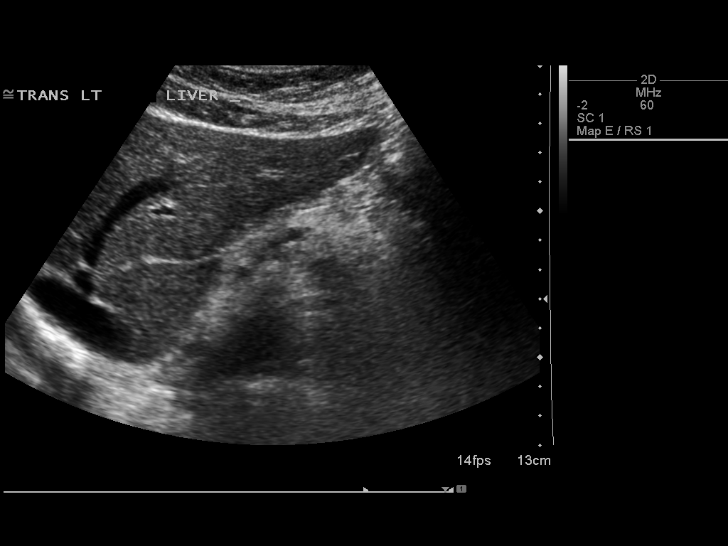
[im 23/68]
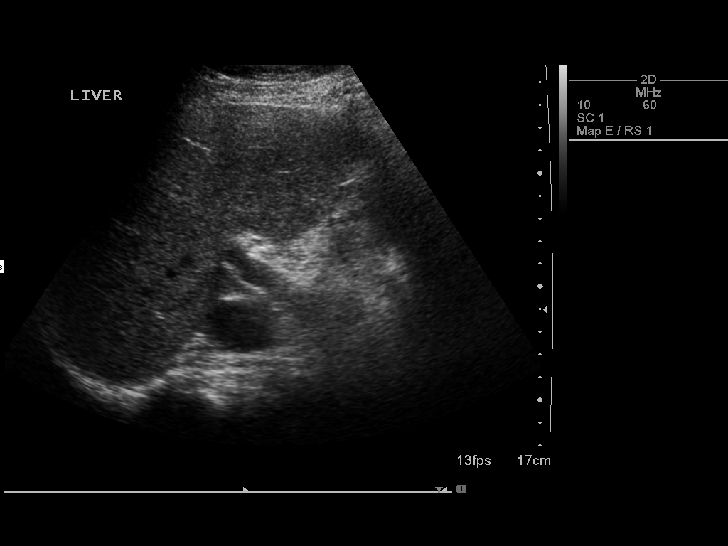
[im 26/68]
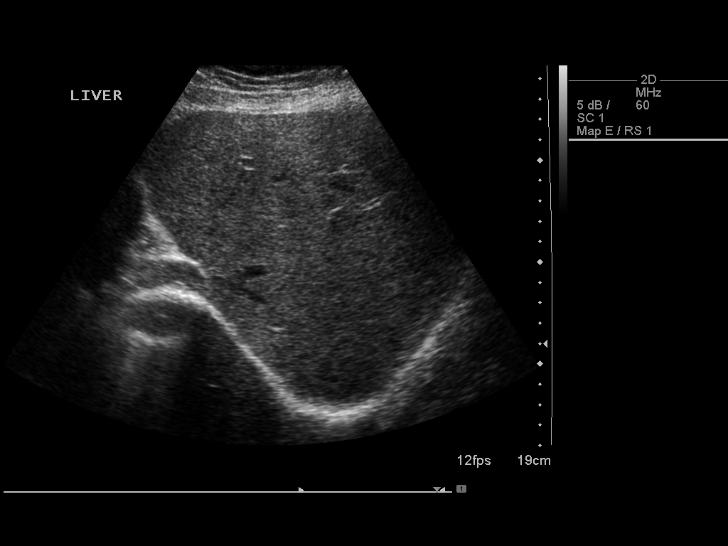
[im 31/68]
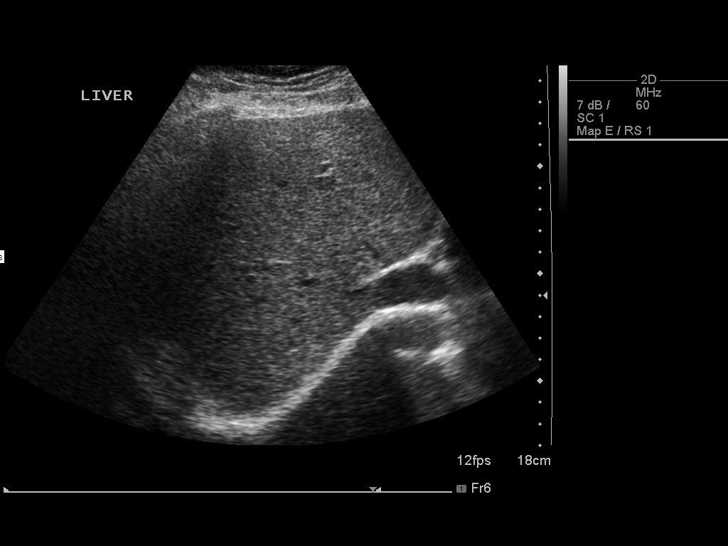
[im 37/68]
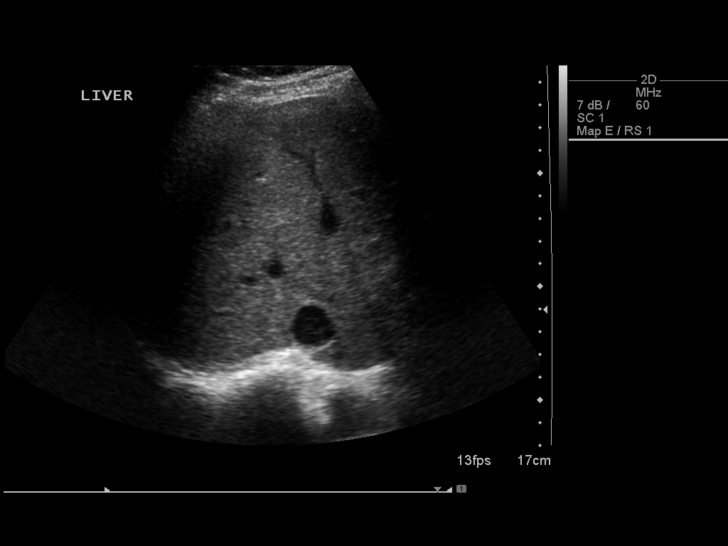
[im 42/68]
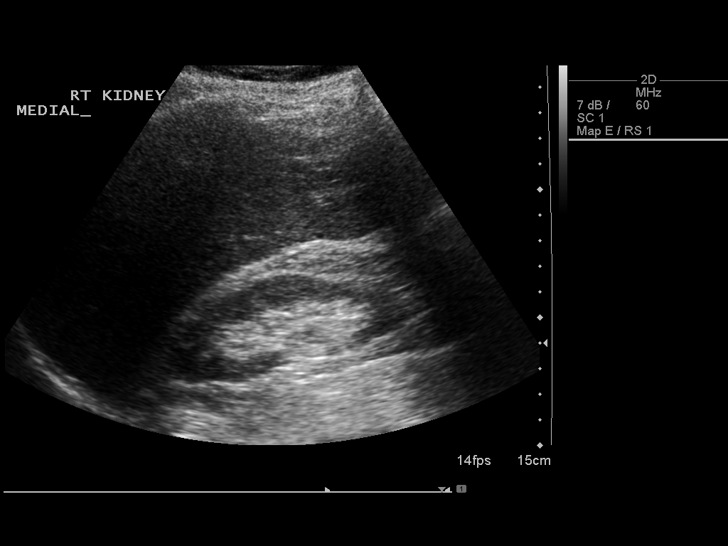
[im 45/68]
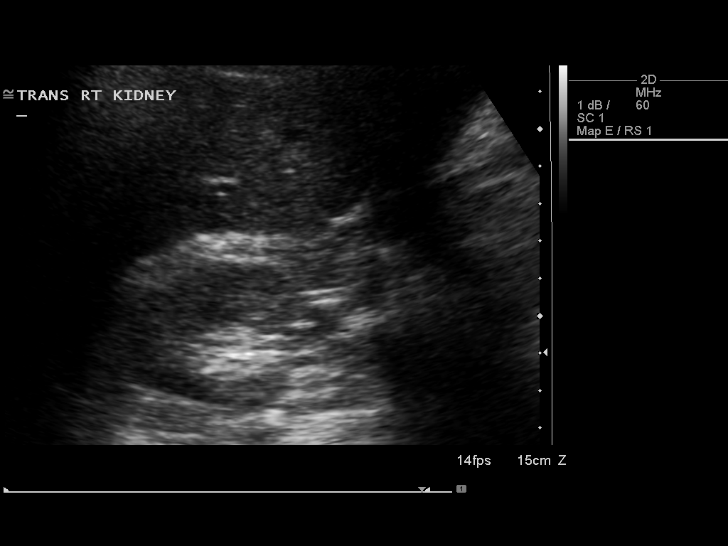
[im 51/68]
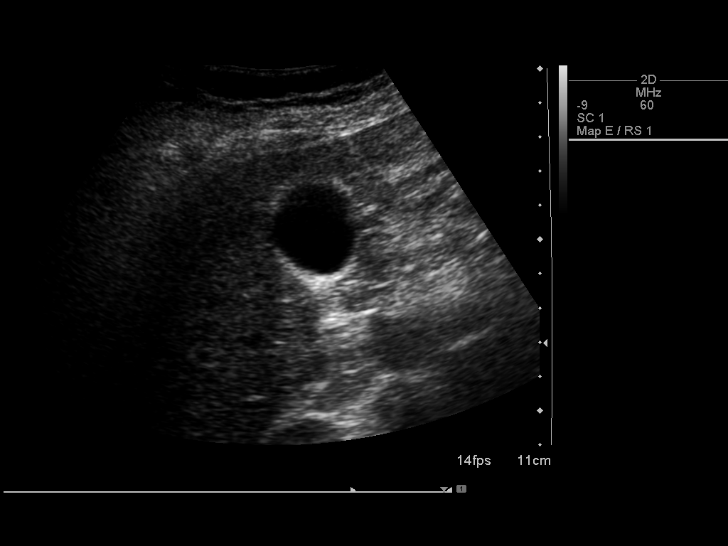
[im 56/68]
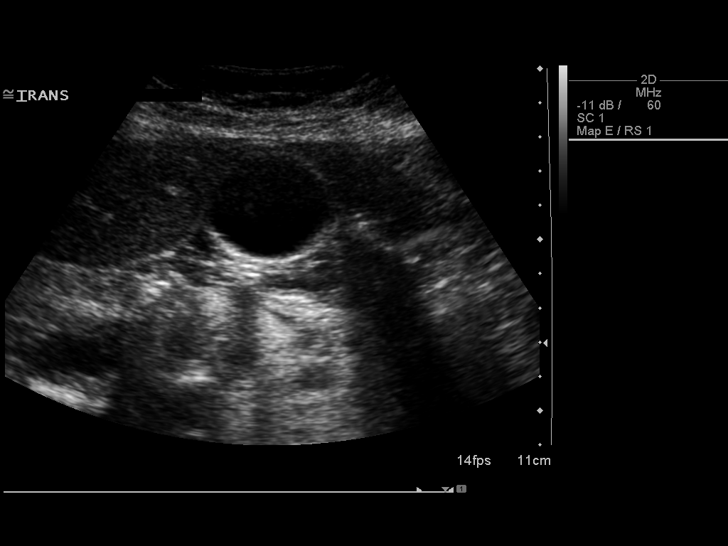
[im 62/68]
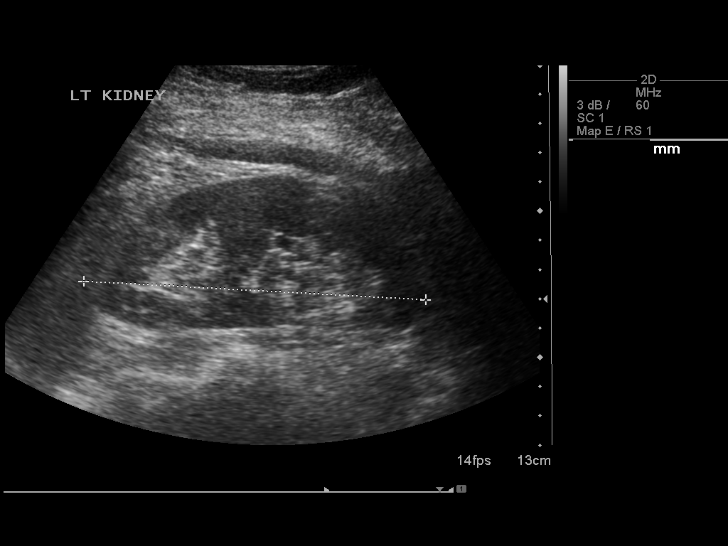
[im 68/68]
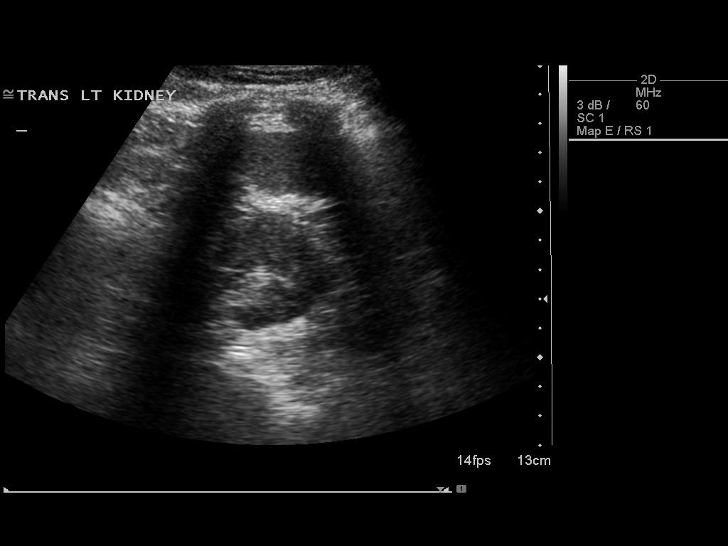

[14 of 25 positions shown; findings below may reference images not displayed]

FINDINGS: Gallbladder:  The gallbladder is visualized and no gallstones are
noted.  There is no pain over the gallbladder with compression.

Common bile duct:  The common bile duct is normal measuring 3.0 mm
in diameter.

Liver:  The liver has a normal echogenic pattern.  No ductal
dilatation is seen.

IVC:  Appears normal.

Pancreas:  No focal abnormality seen.

Spleen:  The spleen is normal measuring 5.6 cm sagittally.

Right Kidney:  No hydronephrosis is seen.  The right kidney
measures 10.5 cm sagittally.

Left Kidney:  No hydronephrosis is noted.  The left kidney measures
11.7 cm.

Abdominal aorta:  The abdominal aorta is normal in caliber with
some atheromatous change present.
IMPRESSION: 1.  No gallstones.  No ductal dilatation.
2.  No hydronephrosis.

## 2012-11-14 ENCOUNTER — Telehealth: Payer: Self-pay

## 2012-11-14 ENCOUNTER — Ambulatory Visit (INDEPENDENT_AMBULATORY_CARE_PROVIDER_SITE_OTHER): Payer: Medicare Other | Admitting: Internal Medicine

## 2012-11-14 VITALS — BP 130/78 | HR 84 | Temp 98.5°F | Resp 16 | Ht 67.5 in | Wt 195.2 lb

## 2012-11-14 DIAGNOSIS — R05 Cough: Secondary | ICD-10-CM

## 2012-11-14 DIAGNOSIS — J029 Acute pharyngitis, unspecified: Secondary | ICD-10-CM

## 2012-11-14 DIAGNOSIS — B379 Candidiasis, unspecified: Secondary | ICD-10-CM

## 2012-11-14 LAB — POCT RAPID STREP A (OFFICE): Rapid Strep A Screen: NEGATIVE

## 2012-11-14 MED ORDER — DOXYCYCLINE HYCLATE 100 MG PO CAPS: 100.0000 mg | ORAL_CAPSULE | Freq: Two times a day (BID) | ORAL | Status: DC

## 2012-11-14 MED ORDER — AZITHROMYCIN 250 MG PO TABS: ORAL_TABLET | ORAL | Status: DC

## 2012-11-14 MED ORDER — HYDROCODONE-ACETAMINOPHEN 7.5-325 MG/15ML PO SOLN: 5.0000 mL | Freq: Four times a day (QID) | ORAL | Status: DC | PRN

## 2012-11-14 MED ORDER — FLUCONAZOLE 150 MG PO TABS: 150.0000 mg | ORAL_TABLET | Freq: Once | ORAL | Status: DC

## 2012-11-14 NOTE — Patient Instructions (Signed)

## 2012-11-14 NOTE — Telephone Encounter (Signed)
Pharmacist called and stated that the pt is there to p/up her Rxs and she does not want the Z pack, she thought Dr Perrin Maltese was going to Rx Doxy. Checked w/Dr Perrin Maltese and he changed Rx to Doxy 100 BID #20. Sending in new Rx.

## 2012-11-14 NOTE — Addendum Note (Signed)
Addended by: Jonita Albee on: 11/14/2012 01:48 PM   Modules accepted: Orders

## 2012-11-14 NOTE — Progress Notes (Signed)
  Subjective:    Patient ID: Mary Mullins, female    DOB: 05/20/1946, 67 y.o.   MRN: [id number]  HPI 2 days cough and bad ST. No fever, body aches. Husband has been sick with a coughing illness. She is prone to strep. No sob, cp.   Review of Systems healthy    Objective:   Physical Exam  Vitals reviewed. Constitutional: She is oriented to person, place, and time. She appears well-developed and well-nourished. No distress.  HENT:  Right Ear: External ear normal.  Left Ear: External ear normal.  Mouth/Throat: Oropharyngeal exudate present.  Eyes: EOM are normal. Left eye exhibits no discharge.  Neck: Normal range of motion. Neck supple.  Cardiovascular: Normal rate and normal heart sounds.   Pulmonary/Chest: Effort normal and breath sounds normal. No respiratory distress. She exhibits no tenderness.  Lymphadenopathy:    She has cervical adenopathy.  Neurological: She is alert and oriented to person, place, and time. She has normal reflexes. No cranial nerve deficit. Coordination normal.  Skin: No rash noted.  Psychiatric: She has a normal mood and affect.   RST Results for orders placed in visit on 11/14/12  POCT RAPID STREP A (OFFICE)      Component Value Range   Rapid Strep A Screen Negative  Negative                 Assessment & Plan:  Zithromax/HC

## 2013-04-04 ENCOUNTER — Ambulatory Visit (INDEPENDENT_AMBULATORY_CARE_PROVIDER_SITE_OTHER): Payer: Medicare Other | Admitting: Family Medicine

## 2013-04-04 VITALS — BP 162/92 | HR 68 | Temp 98.0°F | Resp 16 | Ht 69.0 in | Wt 192.0 lb

## 2013-04-04 DIAGNOSIS — R5383 Other fatigue: Secondary | ICD-10-CM

## 2013-04-04 DIAGNOSIS — IMO0001 Reserved for inherently not codable concepts without codable children: Secondary | ICD-10-CM

## 2013-04-04 DIAGNOSIS — R5381 Other malaise: Secondary | ICD-10-CM | POA: Diagnosis not present

## 2013-04-04 DIAGNOSIS — M2550 Pain in unspecified joint: Secondary | ICD-10-CM

## 2013-04-04 LAB — POCT CBC
MCH, POC: 30.2 pg (ref 27–31.2)
MCHC: 31.4 g/dL — AB (ref 31.8–35.4)
MCV: 96 fL (ref 80–97)
MID (cbc): 0.4 (ref 0–0.9)
POC LYMPH PERCENT: 45.7 %L (ref 10–50)
POC MID %: 7.8 %M (ref 0–12)
Platelet Count, POC: 248 10*3/uL (ref 142–424)
RDW, POC: 13.4 %
WBC: 5.7 10*3/uL (ref 4.6–10.2)

## 2013-04-04 LAB — GLUCOSE, POCT (MANUAL RESULT ENTRY): POC Glucose: 75 mg/dl (ref 70–99)

## 2013-04-04 NOTE — Patient Instructions (Addendum)
Myalgia, Adult Myalgia is the medical term for muscle pain. It is a symptom of many things. Nearly everyone at some time in their life has this. The most common cause for muscle pain is overuse or straining and more so when you are not in shape. Injuries and muscle bruises cause myalgias. Muscle pain without a history of injury can also be caused by a virus. It frequently comes along with the flu. Myalgia not caused by muscle strain can be present in a large number of infectious diseases. Some autoimmune diseases like lupus and fibromyalgia can cause muscle pain. Myalgia may be mild, or severe. SYMPTOMS  The symptoms of myalgia are simply muscle pain. Most of the time this is short lived and the pain goes away without treatment. DIAGNOSIS  Myalgia is diagnosed by your caregiver by taking your history. This means you tell him when the problems began, what they are, and what has been happening. If this has not been a long term problem, your caregiver may want to watch for a while to see what will happen. If it has been long term, they may want to do additional testing. TREATMENT  The treatment depends on what the underlying cause of the muscle pain is. Often anti-inflammatory medications will help. HOME CARE INSTRUCTIONS  If the pain in your muscles came from overuse, slow down your activities until the problems go away.  Myalgia from overuse of a muscle can be treated with alternating hot and cold packs on the muscle affected or with cold for the first couple days. If either heat or cold seems to make things worse, stop their use.  Apply ice to the sore area for 15-20 minutes, 3-4 times per day, while awake for the first 2 days of muscle soreness, or as directed. Put the ice in a plastic bag and place a towel between the bag of ice and your skin.  Only take over-the-counter or prescription medicines for pain, discomfort, or fever as directed by your caregiver.  Regular gentle exercise may help if  you are not active.  Stretching before strenuous exercise can help lower the risk of myalgia. It is normal when beginning an exercise regimen to feel some muscle pain after exercising. Muscles that have not been used frequently will be sore at first. If the pain is extreme, this may mean injury to a muscle. SEEK MEDICAL CARE IF:  You have an increase in muscle pain that is not relieved with medication.  You begin to run a temperature.  You develop nausea and vomiting.  You develop a stiff and painful neck.  You develop a rash.  You develop muscle pain after a tick bite.  You have continued muscle pain while working out even after you are in good condition. SEEK IMMEDIATE MEDICAL CARE IF: Any of your problems are getting worse and medications are not helping. MAKE SURE YOU:   Understand these instructions.  Will watch your condition.  Will get help right away if you are not doing well or get worse. Document Released: 09/02/2006 Document Revised: 01/03/2012 Document Reviewed: 11/22/2006 [facility] Patient Information 2014 [place], [place]. Arthritis, Nonspecific Arthritis is inflammation of a joint. This usually means pain, redness, warmth or swelling are present. One or more joints may be involved. There are a number of types of arthritis. Your caregiver may not be able to tell what type of arthritis you have right away. CAUSES  The most common cause of arthritis is the wear and tear on the joint (  osteoarthritis). This causes damage to the cartilage, which can break down over time. The knees, hips, back and neck are most often affected by this type of arthritis. Other types of arthritis and common causes of joint pain include:  Sprains and other injuries near the joint. Sometimes minor sprains and injuries cause pain and swelling that develop hours later.  Rheumatoid arthritis. This affects hands, feet and knees. It usually affects both sides of your body at the same time. It is  often associated with chronic ailments, fever, weight loss and general weakness.  Crystal arthritis. Gout and pseudo gout can cause occasional acute severe pain, redness and swelling in the foot, ankle, or knee.  Infectious arthritis. Bacteria can get into a joint through a break in overlying skin. This can cause infection of the joint. Bacteria and viruses can also spread through the blood and affect your joints.  Drug, infectious and allergy reactions. Sometimes joints can become mildly painful and slightly swollen with these types of illnesses. SYMPTOMS   Pain is the main symptom.  Your joint or joints can also be red, swollen and warm or hot to the touch.  You may have a fever with certain types of arthritis, or even feel overall ill.  The joint with arthritis will hurt with movement. Stiffness is present with some types of arthritis. DIAGNOSIS  Your caregiver will suspect arthritis based on your description of your symptoms and on your exam. Testing may be needed to find the type of arthritis:  Blood and sometimes urine tests.  X-ray tests and sometimes CT or MRI scans.  Removal of fluid from the joint (arthrocentesis) is done to check for bacteria, crystals or other causes. Your caregiver (or a specialist) will numb the area over the joint with a local anesthetic, and use a needle to remove joint fluid for examination. This procedure is only minimally uncomfortable.  Even with these tests, your caregiver may not be able to tell what kind of arthritis you have. Consultation with a specialist (rheumatologist) may be helpful. TREATMENT  Your caregiver will discuss with you treatment specific to your type of arthritis. If the specific type cannot be determined, then the following general recommendations may apply. Treatment of severe joint pain includes:  Rest.  Elevation.  Anti-inflammatory medication (for example, ibuprofen) may be prescribed. Avoiding activities that cause  increased pain.  Only take over-the-counter or prescription medicines for pain and discomfort as recommended by your caregiver.  Cold packs over an inflamed joint may be used for 10 to 15 minutes every hour. Hot packs sometimes feel better, but do not use overnight. Do not use hot packs if you are diabetic without your caregiver's permission.  A cortisone shot into arthritic joints may help reduce pain and swelling.  Any acute arthritis that gets worse over the next 1 to 2 days needs to be looked at to be sure there is no joint infection. Long-term arthritis treatment involves modifying activities and lifestyle to reduce joint stress jarring. This can include weight loss. Also, exercise is needed to nourish the joint cartilage and remove waste. This helps keep the muscles around the joint strong. HOME CARE INSTRUCTIONS   Do not take aspirin to relieve pain if gout is suspected. This elevates uric acid levels.  Only take over-the-counter or prescription medicines for pain, discomfort or fever as directed by your caregiver.  Rest the joint as much as possible.  If your joint is swollen, keep it elevated.  Use crutches if the  painful joint is in your leg.  Drinking plenty of fluids may help for certain types of arthritis.  Follow your caregiver's dietary instructions.  Try low-impact exercise such as:  Swimming.  Water aerobics.  Biking.  Walking.  Morning stiffness is often relieved by a warm shower.  Put your joints through regular range-of-motion. SEEK MEDICAL CARE IF:   You do not feel better in 24 hours or are getting worse.  You have side effects to medications, or are not getting better with treatment. SEEK IMMEDIATE MEDICAL CARE IF:   You have a fever.  You develop severe joint pain, swelling or redness.  Many joints are involved and become painful and swollen.  There is severe back pain and/or leg weakness.  You have loss of bowel or bladder  control. Document Released: 11/18/2004 Document Revised: 01/03/2012 Document Reviewed: 12/04/2008 [facility] Patient Information 2014 [place], [place].

## 2013-04-04 NOTE — Progress Notes (Signed)
Subjective:    Patient ID: Mary Mullins, female    DOB: 10-10-1946, 67 y.o.   MRN: [id number] Chief Complaint  Patient presents with  . Insect Bite    tick bite   . Generalized Body Aches  . Extremity Weakness   HPI For the past 2 months she has been having more Rt>Lt hip pain.  Has had at least 5 minimum tick bites over the past several months and to numerous to count over the past few years.  However, for the past 2 days her right shoulder blade has been killing her - severe pain radiating down right arm Now hip pain has moved into pelvis and occasionally radiating into either leg - one day even her toe hurt. Thinks she might have gotten lyme disease in past and really wants to be tested for this - has been thinking and reading about it for a long time and convinced this is causing her diffuse mobile arthralgias - though no h/o any febrile illness with rash. No energy - though this is not as concerning to her as her severe joint pain. Been to a chiropractor for hip pain as it was so severe she couldn't even sleep - got partial relief w/ chiropractor treatment. Has tried some aleve occ but no topical treatment - no ice/heat as pain is always moving. No injuries though active in life. Has underactive thyroid - diagnosed by a holistic doctor in [place] last yr that her daughter wanted her to see - gave her rx of vitamins to take which she didn't notice any difference w/ so is no longer taking. Was not having any of these sxs at the time of that eval.  History reviewed. No pertinent past medical history. No current outpatient prescriptions on file prior to visit.   No current facility-administered medications on file prior to visit.   No Known Allergies  Review of Systems  Constitutional: Positive for activity change and fatigue. Negative for fever, chills, diaphoresis and unexpected weight change.  Musculoskeletal: Positive for myalgias, back pain, arthralgias and gait problem.  Negative for joint swelling.  Skin: Negative for color change and rash.  Neurological: Negative for weakness and numbness.       BP 162/92  Pulse 68  Temp(Src) 98 F (36.7 C) (Oral)  Resp 16  Ht 5\' 9"  (1.753 m)  Wt 192 lb (87.091 kg)  BMI 28.34 kg/m2  SpO2 99% Objective:   Physical Exam  Constitutional: She is oriented to person, place, and time. She appears well-developed and well-nourished. No distress.  HENT:  Head: Normocephalic and atraumatic.  Right Ear: External ear normal.  Left Ear: External ear normal.  Eyes: Conjunctivae are normal. No scleral icterus.  Neck: Normal range of motion. Neck supple. No thyromegaly present.  Cardiovascular: Normal rate, regular rhythm, normal heart sounds and intact distal pulses.   Pulmonary/Chest: Effort normal and breath sounds normal. No respiratory distress.  Musculoskeletal: She exhibits no edema.  Lymphadenopathy:    She has no cervical adenopathy.  Neurological: She is alert and oriented to person, place, and time. She has normal strength. She displays no atrophy. No sensory deficit. She exhibits normal muscle tone. Gait normal.  Reflex Scores:      Bicep reflexes are 2+ on the right side and 2+ on the left side.      Brachioradialis reflexes are 2+ on the right side and 2+ on the left side.      Patellar reflexes are 2+ on the right side and  2+ on the left side.      Achilles reflexes are 2+ on the right side. Skin: Skin is warm and dry. She is not diaphoretic. No erythema.  Psychiatric: She has a normal mood and affect. Her behavior is normal.      Results for orders placed in visit on 04/04/13  POCT CBC      Result Value Range   WBC 5.7  4.6 - 10.2 K/uL   Lymph, poc 2.6  0.6 - 3.4   POC LYMPH PERCENT 45.7  10 - 50 %L   MID (cbc) 0.4  0 - 0.9   POC MID % 7.8  0 - 12 %M   POC Granulocyte 2.7  2 - 6.9   Granulocyte percent 46.5  37 - 80 %G   RBC 4.31  4.04 - 5.48 M/uL   Hemoglobin 13.0  12.2 - 16.2 g/dL   HCT, POC 21.3   08.6 - 47.9 %   MCV 96.0  80 - 97 fL   MCH, POC 30.2  27 - 31.2 pg   MCHC 31.4 (*) 31.8 - 35.4 g/dL   RDW, POC 57.8     Platelet Count, POC 248  142 - 424 K/uL   MPV 8.2  0 - 99.8 fL  GLUCOSE, POCT (MANUAL RESULT ENTRY)      Result Value Range   POC Glucose 75  70 - 99 mg/dl    Assessment & Plan:  Arthralgia - Plan: CK, C-reactive protein, B. burgdorfi antibodies, TSH, Comprehensive metabolic panel, T4, free, T3, Free, POCT CBC, POCT glucose (manual entry), POCT SEDIMENTATION RATE  Myalgia and myositis - Plan: CK, C-reactive protein, B. burgdorfi antibodies, TSH, Comprehensive metabolic panel, T4, free, T3, Free, POCT CBC, POCT glucose (manual entry), POCT SEDIMENTATION RATE  Other malaise and fatigue - Plan: CK, C-reactive protein, B. burgdorfi antibodies, TSH, Comprehensive metabolic panel, T4, free, T3, Free, POCT CBC, POCT glucose (manual entry), POCT SEDIMENTATION RATE  Meds ordered this encounter  Medications  . calcium carbonate 200 MG capsule    Sig: Take 250 mg by mouth 2 (two) times daily with a meal.  . Multiple Vitamin (MULTIVITAMIN) tablet    Sig: Take 1 tablet by mouth daily.   Unknown etiology so will proceed primarily with lab work eval - pt really wants tested for lyme disease - more likely to be releated to the "underactive thyroid" she was diagnosed w/ prev - she will fax in her prior lab work for comparison to what we get today

## 2013-04-05 LAB — C-REACTIVE PROTEIN: CRP: <0.5 mg/dL (ref <0.60)

## 2013-04-05 LAB — COMPREHENSIVE METABOLIC PANEL
Albumin: 4 g/dL (ref 3.5–5.2)
Alkaline Phosphatase: 68 U/L (ref 39–117)
BUN: 13 mg/dL (ref 6–23)
Creat: 0.91 mg/dL (ref 0.50–1.10)
Glucose, Bld: 91 mg/dL (ref 70–99)
Total Bilirubin: 0.5 mg/dL (ref 0.3–1.2)

## 2013-04-05 LAB — T3, FREE: T3, Free: 2.6 pg/mL (ref 2.3–4.2)

## 2013-04-05 LAB — T4, FREE: Free T4: 1.07 ng/dL (ref 0.80–1.80)

## 2013-04-05 LAB — CK: Total CK: 223 U/L — ABNORMAL HIGH (ref 7–177)

## 2013-04-06 LAB — B. BURGDORFI ANTIBODIES: B burgdorferi Ab IgG+IgM: 0.16 {ISR}

## 2013-05-30 ENCOUNTER — Other Ambulatory Visit: Payer: Self-pay

## 2013-08-30 ENCOUNTER — Other Ambulatory Visit: Payer: Self-pay

## 2014-08-28 DIAGNOSIS — M6281 Muscle weakness (generalized): Secondary | ICD-10-CM | POA: Diagnosis not present

## 2014-08-28 DIAGNOSIS — M65341 Trigger finger, right ring finger: Secondary | ICD-10-CM | POA: Diagnosis not present

## 2014-08-30 ENCOUNTER — Encounter: Payer: Self-pay | Admitting: Diagnostic Neuroimaging

## 2014-08-30 ENCOUNTER — Ambulatory Visit (INDEPENDENT_AMBULATORY_CARE_PROVIDER_SITE_OTHER): Payer: Medicare Other | Admitting: Diagnostic Neuroimaging

## 2014-08-30 VITALS — BP 150/85 | HR 65 | Temp 96.9°F | Ht 68.5 in | Wt 199.6 lb

## 2014-08-30 DIAGNOSIS — G5611 Other lesions of median nerve, right upper limb: Secondary | ICD-10-CM | POA: Diagnosis not present

## 2014-08-30 DIAGNOSIS — G5621 Lesion of ulnar nerve, right upper limb: Secondary | ICD-10-CM

## 2014-08-30 NOTE — Patient Instructions (Signed)
I will check EMG/NCS.

## 2014-08-30 NOTE — Progress Notes (Signed)
[facility]  PATIENT: Mary Mullins DOB: February 08, 1946  REFERRING CLINICIAN: Grandville Silos HISTORY FROM: patient abd husband REASON FOR VISIT: new consult    HISTORICAL  CHIEF COMPLAINT:  Chief Complaint  Patient presents with  . Neurologic Problem    loosing strength of muscle in R hand    HISTORY OF PRESENT ILLNESS:   68 year old right-handed female here for evaluation of right hand weakness. Patient referred for consideration of possible brachial neuritis.  Spring 2015, patient had onset of right shoulder pain, radiating down the right arm, with right hand weakness. Pain symptoms spontaneously improved within a few months. She thinks right hand weakness also improved partially. Over the past 1-2 months, patient has noticed increasing weakness in the right hand. She also also noticed muscle atrophy in the right hand. No problems with left arm, bilateral legs, face, swallowing, speech, breathing.  Of note patient had right arm/forearm fracture in 2008 requiring open reduction internal fixation with metal hardware. Patient did well following surgery. Patient does report some increasing soreness and tenderness in the right distal radius region.   REVIEW OF SYSTEMS: Full 14 system review of systems performed and notable only for weakness in right hand.  ALLERGIES: No Known Allergies  HOME MEDICATIONS: Outpatient Prescriptions Prior to Visit  Medication Sig Dispense Refill  . calcium carbonate 200 MG capsule Take 250 mg by mouth 2 (two) times daily with a meal.    . Multiple Vitamin (MULTIVITAMIN) tablet Take 1 tablet by mouth daily.     No facility-administered medications prior to visit.    PAST MEDICAL HISTORY: History reviewed. No pertinent past medical history.  PAST SURGICAL HISTORY: Past Surgical History  Procedure Laterality Date  . Ankle fracture surgery      L ankle  . Wrist fracture surgery      R wrist  . Eye surgery    . Wrist surgery  Right 2007    FAMILY HISTORY: Family History  Problem Relation Age of Onset  . Heart Problems Mother   . Cirrhosis Father     SOCIAL HISTORY:  History   Social History  . Marital Status: Married    Spouse Name: Ezra Sites    Number of Children: 2  . Years of Education: HS   Occupational History  .  Other    self-employed   Social History Main Topics  . Smoking status: Never Smoker   . Smokeless tobacco: Never Used  . Alcohol Use: 0.0 oz/week    0 Not specified per week     Comment: 1 glass of wine daily  . Drug Use: No  . Sexual Activity: Not on file   Other Topics Concern  . Not on file   Social History Narrative   Patient lives at home with spouse.   Caffeine use: 3 cups daily     PHYSICAL EXAM  Filed Vitals:   08/30/14 0909  BP: 150/85  Pulse: 65  Temp: 96.9 F (36.1 C)  TempSrc: Oral  Height: 5' 8.5" (1.74 m)  Weight: 199 lb 9.6 oz (90.538 kg)    Body mass index is 29.9 kg/(m^2).   Visual Acuity Screening   Right eye Left eye Both eyes  Without correction:     With correction: 20/70 20/40     No flowsheet data found.  GENERAL EXAM: Patient is in no distress; well developed, nourished and groomed; neck is supple  CARDIOVASCULAR: Regular rate and rhythm, no murmurs, no carotid bruits  NEUROLOGIC: MENTAL STATUS: awake, alert,  oriented to person, place and time, recent and remote memory intact, normal attention and concentration, language fluent, comprehension intact, naming intact, fund of knowledge appropriate CRANIAL NERVE: no papilledema on fundoscopic exam, pupils equal and reactive to light, visual fields full to confrontation, extraocular muscles intact, no nystagmus, facial sensation and strength symmetric, hearing intact, palate elevates symmetrically, uvula midline, shoulder shrug symmetric, tongue midline. MOTOR: normal bulk and tone, full strength in the LUE, BLE; RIGHT UPPER EXT 5/5 EXCEPT: (ATROPHY OF ABDUCTOR POLLICIS BREVIS,  FIRST DORSAL INTEROSSEOUS, ABDUCTOR DIGITI MINIMI; WEAKNESS OF THUMB (FLEXION AND ADDUCTION), FINGER FLEXION AT MCP (DIGITS 2-5), FINGER ABDUCTION) SENSORY: normal and symmetric to light touch, pinprick, temperature, vibration; EXCEPT SLIGHT DECR IN RIGHT HAND (ULNAR PALMAR BRANCH DISTRIBUTION) COORDINATION: finger-nose-finger, fine finger movements normal REFLEXES: deep tendon reflexes present and symmetric GAIT/STATION: narrow based gait; able to walk on toes, heels and tandem; romberg is negative    DIAGNOSTIC DATA (LABS, IMAGING, TESTING) - I reviewed patient records, labs, notes, testing and imaging myself where available.  Lab Results  Component Value Date   WBC 5.7 04/04/2013   HGB 13.0 04/04/2013   HCT 41.4 04/04/2013   MCV 96.0 04/04/2013      Component Value Date/Time   NA 140 04/04/2013 1747   K 4.4 04/04/2013 1747   CL 103 04/04/2013 1747   CO2 28 04/04/2013 1747   GLUCOSE 91 04/04/2013 1747   BUN 13 04/04/2013 1747   CREATININE 0.91 04/04/2013 1747   CALCIUM 9.1 04/04/2013 1747   PROT 6.7 04/04/2013 1747   ALBUMIN 4.0 04/04/2013 1747   AST 22 04/04/2013 1747   ALT 22 04/04/2013 1747   ALKPHOS 68 04/04/2013 1747   BILITOT 0.5 04/04/2013 1747   No results found for: CHOL, HDL, LDLCALC, LDLDIRECT, TRIG, CHOLHDL No results found for: HGBA1C No results found for: VITAMINB12 Lab Results  Component Value Date   TSH 2.098 04/04/2013      ASSESSMENT AND PLAN  68 y.o. year old female here with right hand muscle weakness and atrophy involving median and ulnar nerve distributions, with mild sensory loss in ulnar distribution. 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 Could represent multifocal neuropathy versus brachial plexopathy versus cervical radiculopathy. We'll check additional testing.  PLAN:  Orders Placed This Encounter  Procedures  . NCV with EMG(electromyography)    Return for for NCV/EMG.    Penni Bombard, MD  07/25/7509, 25:85 AM Certified in Neurology, Neurophysiology and Neuroimaging  [facility] [address] [place], [place] [address] [phone]

## 2014-09-09 ENCOUNTER — Encounter: Payer: 59 | Admitting: Diagnostic Neuroimaging

## 2014-09-26 ENCOUNTER — Ambulatory Visit (INDEPENDENT_AMBULATORY_CARE_PROVIDER_SITE_OTHER): Payer: Medicare Other | Admitting: Diagnostic Neuroimaging

## 2014-09-26 ENCOUNTER — Encounter (INDEPENDENT_AMBULATORY_CARE_PROVIDER_SITE_OTHER): Payer: Self-pay | Admitting: Diagnostic Neuroimaging

## 2014-09-26 DIAGNOSIS — G5611 Other lesions of median nerve, right upper limb: Secondary | ICD-10-CM

## 2014-09-26 DIAGNOSIS — G540 Brachial plexus disorders: Secondary | ICD-10-CM

## 2014-09-26 DIAGNOSIS — G5621 Lesion of ulnar nerve, right upper limb: Secondary | ICD-10-CM

## 2014-09-26 DIAGNOSIS — Z0289 Encounter for other administrative examinations: Secondary | ICD-10-CM

## 2014-09-29 NOTE — Procedures (Signed)
   [facility]  NCS (NERVE CONDUCTION STUDY) WITH EMG (ELECTROMYOGRAPHY) REPORT   STUDY DATE: 09/26/14  PATIENT NAME: Mary Mullins DOB: Jul 22, 1946 MRN: [id number]  ORDERING CLINICIAN: Andrey Spearman, MD   TECHNOLOGIST: Laretta Alstrom  ELECTROMYOGRAPHER: Earlean Polka. Sequita Wise, MD  CLINICAL INFORMATION: 68 year old female with right arm pain, numbness and weakness. History of right forearm fracture.  FINDINGS: NERVE CONDUCTION STUDY: Left median and left ulnar motor responses and F wave latencies are normal.  Right median motor response has prolonged distal latency (4.6 ms), significantly decreased amplitude, normal conduction velocity and normal F-wave latency. Right ulnar motor responses normal distal latency, decreased amplitude, normal conduction velocity and absent F-wave latency; conduction velocity with stimulus and above the elbow is 62 m/s and conduction velocity with stimulation below the elbow is 52 m/s.  Bilateral median, left ulnar, bilateral radial sensory responses are normal.  Right ulnar sensory response is absent.   NEEDLE ELECTROMYOGRAPHY: Right deltoid, right biceps, right triceps, right flexor carpi radialis muscles are normal.  The right flexor carpi ulnaris has no abnormal spontaneous activity at rest and significantly decreased motor unit recruitment on exertion.  Right first dorsal interosseous has 3+ positive sharp waves and fibrillation potentials arrest and significantly decreased motor unit recruitment on exertion.  Right C5-6 and right C7-T1 paraspinal muscles are normal.   IMPRESSION:  Abnormal study demonstrating: 1. 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  2. 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 3. No evidence of cervical radiculopathy.     INTERPRETING PHYSICIAN:  Penni Bombard, MD Certified in Neurology, Neurophysiology and Neuroimaging  [facility] [address] [place], [place] [address] [phone]

## 2014-10-03 ENCOUNTER — Telehealth: Payer: Self-pay | Admitting: Diagnostic Neuroimaging

## 2014-10-03 NOTE — Telephone Encounter (Signed)
Patient questioning why she's having MRI of the Chest.  Requesting MRI of shoulder, neck and lower back.  Please call and advise.

## 2014-10-28 DIAGNOSIS — M545 Low back pain: Secondary | ICD-10-CM | POA: Diagnosis not present

## 2014-11-11 ENCOUNTER — Ambulatory Visit
Admission: RE | Admit: 2014-11-11 | Discharge: 2014-11-11 | Disposition: A | Discharge status: Home / Self Care | Payer: Medicare Other | Source: Ambulatory Visit | Attending: Diagnostic Neuroimaging | Admitting: Diagnostic Neuroimaging

## 2014-11-11 DIAGNOSIS — G540 Brachial plexus disorders: Secondary | ICD-10-CM

## 2014-11-18 ENCOUNTER — Inpatient Hospital Stay: Admission: RE | Admit: 2014-11-18 | Payer: Medicare Other | Source: Ambulatory Visit

## 2014-11-22 DIAGNOSIS — M542 Cervicalgia: Secondary | ICD-10-CM | POA: Diagnosis not present

## 2014-11-22 DIAGNOSIS — M25511 Pain in right shoulder: Secondary | ICD-10-CM | POA: Diagnosis not present

## 2014-11-22 DIAGNOSIS — G540 Brachial plexus disorders: Secondary | ICD-10-CM | POA: Diagnosis not present

## 2014-11-27 ENCOUNTER — Telehealth: Payer: Self-pay

## 2014-11-27 NOTE — Telephone Encounter (Signed)
Patient states she had MRI done last week at [facility] and she is requesting results.

## 2014-12-03 ENCOUNTER — Encounter: Payer: Self-pay | Admitting: *Deleted

## 2014-12-03 ENCOUNTER — Telehealth: Payer: Self-pay | Admitting: Diagnostic Neuroimaging

## 2014-12-03 DIAGNOSIS — R200 Anesthesia of skin: Secondary | ICD-10-CM

## 2014-12-03 DIAGNOSIS — M47812 Spondylosis without myelopathy or radiculopathy, cervical region: Secondary | ICD-10-CM

## 2014-12-03 DIAGNOSIS — R29898 Other symptoms and signs involving the musculoskeletal system: Secondary | ICD-10-CM

## 2014-12-03 NOTE — Telephone Encounter (Signed)
Patient calling for MRI results.  Please call and advise.

## 2014-12-05 ENCOUNTER — Telehealth: Payer: Self-pay | Admitting: *Deleted

## 2014-12-05 NOTE — Telephone Encounter (Signed)
Patient stated this is the 3rd call for MRI results.  If she doesn't hear anything today, she will go to another Neurologist.  Please call and advise.

## 2014-12-05 NOTE — Telephone Encounter (Signed)
Spoke to the Mary Mullins on the phone and informed her that her MRI was normal but Dr. Leta Baptist was going to order an MRI of her cervical spine to check and see if anything in her neck was occuring to cause her hand troubles. Mary Mullins stated an understanding.

## 2014-12-05 NOTE — Addendum Note (Signed)
Addended byAndrey Spearman on: 12/05/2014 11:48 AM   Modules accepted: Orders

## 2014-12-26 DIAGNOSIS — M6281 Muscle weakness (generalized): Secondary | ICD-10-CM | POA: Diagnosis not present

## 2014-12-26 DIAGNOSIS — M65341 Trigger finger, right ring finger: Secondary | ICD-10-CM | POA: Diagnosis not present

## 2014-12-26 DIAGNOSIS — M65331 Trigger finger, right middle finger: Secondary | ICD-10-CM | POA: Diagnosis not present

## 2015-01-08 ENCOUNTER — Telehealth: Payer: Self-pay | Admitting: Diagnostic Neuroimaging

## 2015-01-08 NOTE — Telephone Encounter (Signed)
Dr. Grandville Silos with [facility] is calling regarding the patient. He did receive office notes but wants to discuss further. Please call at your convenience. Thank you.

## 2015-01-10 NOTE — Telephone Encounter (Signed)
I spoke with Dr. Grandville Silos.  Please follow up with patient and see if MRI cervical spine has been arrange. I can see patient back in clinic after MRI.   -VRP

## 2015-01-13 NOTE — Telephone Encounter (Signed)
Called patient. She says she does not see the point of having a MRI.  We went over the importance of imaging to help with dx and treatment. Patient says she refuses to have  anymore testing and would like to speak with Dr. Leta Baptist about the next step.

## 2015-01-17 NOTE — Telephone Encounter (Signed)
I called patient. She is reluctant to pursue MRI cervical spine. She will continue trigger point injections and OT. -VRP

## 2015-02-12 ENCOUNTER — Other Ambulatory Visit: Payer: Self-pay | Admitting: Orthopedic Surgery

## 2015-02-12 DIAGNOSIS — M65341 Trigger finger, right ring finger: Secondary | ICD-10-CM | POA: Diagnosis not present

## 2015-02-13 NOTE — H&P (Signed)
Mary Mullins is an 69 y.o. female.   CC / Reason for Visit: Right hand problem HPI: This patient returns for reevaluation, indicating that she is tired of dealing with the decreased dexterity and the pain involved with her right long and ring finger trigger digits.  HPI 12-26-14: This patient returns for reevaluation, having undergone some neurological evaluation since I last saw her.  She did have electrodiagnostic studies performed on 09-29-14, which were interpreted as having right proximal ulnar neuropathy, but the branch to the FCU, and right median neuropathy at the wrist without evidence of cervical radiculopathy.  She reports that her right ring finger is still catching with each cycle, which is quite annoying and bothers her dexterity.  In addition right long finger is beginning to trigger.  Her primary complaint is wanting to be stronger in her right hand and arm, with better dexterity.  HPI 08-28-14: This patient returns for reevaluation, having been last evaluated on 06-20-13.  Since that time, she has had improvement in the strength of her right hand, but is concerned about the visible atrophy of the muscles in the hand and persisting weakness.  She continues to have trigger symptoms of the right ring finger and thinks she may be developing some similar symptoms in the right long and small fingers.  In addition she has some pain at the base of the right thumb.  It is worsened with pinching activities.  She never did undergo neurology evaluation.    Presenting history follows: This patient is a 69 year old female who presents for evaluation of right hand weakness.  She reports that about 2 months ago she had the acute onset of right shoulder pain.  It was intense and burning.  She thought it was subacromial bursitis.  It is now 90% better.  The pain started from her shoulder down the back of the arm into the hand.  Subsequent to that she has developed weakness in the hand and finds that it is  difficult to right, clumsy to type, but not really painful.  She has had a right ring finger trigger digit for years that she has managed nonoperatively.  She finds that she has some scattered pains about the right hand are really has weakness more than pain.  She does have a history of a right distal both bone forearm fracture which was plated.  She denies any numbness and tingling.  No past medical history on file.  Past Surgical History  Procedure Laterality Date  . Ankle fracture surgery      L ankle  . Wrist fracture surgery      R wrist  . Eye surgery    . Wrist surgery Right 2007    Family History  Problem Relation Age of Onset  . Heart Problems Mother   . Cirrhosis Father    Social History:  reports that she has never smoked. She has never used smokeless tobacco. She reports that she drinks alcohol. She reports that she does not use illicit drugs.  Allergies: No Known Allergies  No prescriptions prior to admission    No results found for this or any previous visit (from the past 48 hour(s)). No results found.  Review of Systems  All other systems reviewed and are negative.   There were no vitals taken for this visit. Physical Exam  Constitutional:  WD, WN, NAD HEENT:  NCAT, EOMI Neuro/Psych:  Alert & oriented to person, place, and time; appropriate mood & affect Lymphatic: No generalized UE  edema or lymphadenopathy Extremities / MSK:  Both UE are normal with respect to appearance, ranges of motion, joint stability, muscle strength/tone, sensation, & perfusion except as otherwise noted:  The right hand has some PIP flexion contractures across all the digits as well as still noticeable intrinsic atrophy.  There is some prominence to certain parts of the palmar fascia without findings sufficient to support the diagnosis of Dupuytren's.  There is tenderness over the A1 pulleys of both the long and ring fingers, with overt locking of each with each flexion cycle.  Labs /  X-rays:  No radiographic studies obtained today.  Assessment: 1.  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  Right hand muscle atrophy without discernible sensory changes 3.  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  She would like to proceed with trigger finger releases, and we will plan to do this on Monday.  The details of the operative procedure were discussed with the patient.  Questions were invited and answered.  In addition to the goal of the procedure, the risks of the procedure to include but not limited to bleeding; infection; damage to the nerves or blood vessels that could result in bleeding, numbness, weakness, chronic pain, and the need for additional procedures; stiffness; the need for revision surgery; and anesthetic risks, were reviewed.  No specific outcome was guaranteed or implied.  Informed consent was obtained.  Chavela Justiniano A. 02/13/2015, 2:07 PM

## 2015-02-14 ENCOUNTER — Encounter (HOSPITAL_BASED_OUTPATIENT_CLINIC_OR_DEPARTMENT_OTHER): Payer: Self-pay | Admitting: *Deleted

## 2015-02-17 ENCOUNTER — Ambulatory Visit (HOSPITAL_BASED_OUTPATIENT_CLINIC_OR_DEPARTMENT_OTHER): Payer: Medicare Other | Admitting: Anesthesiology

## 2015-02-17 ENCOUNTER — Ambulatory Visit (HOSPITAL_BASED_OUTPATIENT_CLINIC_OR_DEPARTMENT_OTHER)
Admission: RE | Admit: 2015-02-17 | Discharge: 2015-02-17 | Disposition: A | Discharge status: Home / Self Care | Payer: Medicare Other | Source: Ambulatory Visit | Attending: Orthopedic Surgery | Admitting: Orthopedic Surgery

## 2015-02-17 ENCOUNTER — Encounter (HOSPITAL_BASED_OUTPATIENT_CLINIC_OR_DEPARTMENT_OTHER): Payer: Self-pay | Admitting: Anesthesiology

## 2015-02-17 ENCOUNTER — Encounter (HOSPITAL_BASED_OUTPATIENT_CLINIC_OR_DEPARTMENT_OTHER)
Admission: RE | Disposition: A | Discharge status: Home / Self Care | Payer: Self-pay | Source: Ambulatory Visit | Attending: Orthopedic Surgery

## 2015-02-17 DIAGNOSIS — M65341 Trigger finger, right ring finger: Secondary | ICD-10-CM | POA: Diagnosis not present

## 2015-02-17 DIAGNOSIS — M65331 Trigger finger, right middle finger: Secondary | ICD-10-CM | POA: Diagnosis not present

## 2015-02-17 HISTORY — PX: TRIGGER FINGER RELEASE: SHX641

## 2015-02-17 HISTORY — DX: Myoneural disorder, unspecified: G70.9

## 2015-02-17 LAB — POCT HEMOGLOBIN-HEMACUE: HEMOGLOBIN: 13.2 g/dL (ref 12.0–15.0)

## 2015-02-17 SURGERY — RELEASE, A1 PULLEY, FOR TRIGGER FINGER
Anesthesia: Monitor Anesthesia Care | Site: Finger | Laterality: Right

## 2015-02-17 MED ORDER — MIDAZOLAM HCL 2 MG/2ML IJ SOLN
INTRAMUSCULAR | Status: AC
  Filled 2015-02-17: qty 2

## 2015-02-17 MED ORDER — OXYCODONE HCL 5 MG/5ML PO SOLN: 5.0000 mg | Freq: Once | ORAL | Status: DC | PRN

## 2015-02-17 MED ORDER — FENTANYL CITRATE (PF) 100 MCG/2ML IJ SOLN
INTRAMUSCULAR | Status: DC | PRN
  Administered 2015-02-17: 100 ug via INTRAVENOUS

## 2015-02-17 MED ORDER — LIDOCAINE HCL 2 % IJ SOLN
INTRAMUSCULAR | Status: DC | PRN
  Administered 2015-02-17: 2.5 mL

## 2015-02-17 MED ORDER — MIDAZOLAM HCL 2 MG/2ML IJ SOLN
1.0000 mg | INTRAMUSCULAR | Status: DC | PRN
  Administered 2015-02-17 (×2): 1 mg via INTRAVENOUS

## 2015-02-17 MED ORDER — BUPIVACAINE-EPINEPHRINE 0.5% -1:200000 IJ SOLN
INTRAMUSCULAR | Status: DC | PRN
  Administered 2015-02-17: 2.5 mL

## 2015-02-17 MED ORDER — PROPOFOL 10 MG/ML IV BOLUS
INTRAVENOUS | Status: DC | PRN
  Administered 2015-02-17: 20 mg via INTRAVENOUS
  Administered 2015-02-17: 30 mg via INTRAVENOUS
  Administered 2015-02-17: 10 mg via INTRAVENOUS

## 2015-02-17 MED ORDER — CEFAZOLIN SODIUM-DEXTROSE 2-3 GM-% IV SOLR
2.0000 g | INTRAVENOUS | Status: AC
  Administered 2015-02-17: 2 g via INTRAVENOUS

## 2015-02-17 MED ORDER — FENTANYL CITRATE (PF) 100 MCG/2ML IJ SOLN
INTRAMUSCULAR | Status: AC
  Filled 2015-02-17: qty 2

## 2015-02-17 MED ORDER — GLYCOPYRROLATE 0.2 MG/ML IJ SOLN: 0.2000 mg | Freq: Once | INTRAMUSCULAR | Status: DC | PRN

## 2015-02-17 MED ORDER — BUPIVACAINE-EPINEPHRINE (PF) 0.5% -1:200000 IJ SOLN
INTRAMUSCULAR | Status: AC
  Filled 2015-02-17: qty 30

## 2015-02-17 MED ORDER — LACTATED RINGERS IV SOLN
INTRAVENOUS | Status: DC
  Administered 2015-02-17: 09:00:00 via INTRAVENOUS

## 2015-02-17 MED ORDER — HYDROCODONE-ACETAMINOPHEN 5-325 MG PO TABS: 1.0000 | ORAL_TABLET | ORAL | Status: DC | PRN

## 2015-02-17 MED ORDER — ONDANSETRON HCL 4 MG/2ML IJ SOLN: 4.0000 mg | Freq: Once | INTRAMUSCULAR | Status: DC | PRN

## 2015-02-17 MED ORDER — OXYCODONE HCL 5 MG PO TABS: 5.0000 mg | ORAL_TABLET | Freq: Once | ORAL | Status: DC | PRN

## 2015-02-17 MED ORDER — HYDROMORPHONE HCL 1 MG/ML IJ SOLN: 0.2500 mg | INTRAMUSCULAR | Status: DC | PRN

## 2015-02-17 MED ORDER — ONDANSETRON HCL 4 MG/2ML IJ SOLN
INTRAMUSCULAR | Status: DC | PRN
  Administered 2015-02-17: 4 mg via INTRAVENOUS

## 2015-02-17 MED ORDER — FENTANYL CITRATE (PF) 100 MCG/2ML IJ SOLN: 50.0000 ug | INTRAMUSCULAR | Status: DC | PRN

## 2015-02-17 MED ORDER — LIDOCAINE HCL 2 % IJ SOLN
INTRAMUSCULAR | Status: AC
  Filled 2015-02-17: qty 20

## 2015-02-17 MED ORDER — LACTATED RINGERS IV SOLN: INTRAVENOUS | Status: DC

## 2015-02-17 SURGICAL SUPPLY — 41 items
BLADE MINI RND TIP GREEN BEAV (BLADE) IMPLANT
BLADE SURG 15 STRL LF DISP TIS (BLADE) ×1 IMPLANT
BLADE SURG 15 STRL SS (BLADE) ×2
BNDG COHESIVE 1X5 TAN STRL LF (GAUZE/BANDAGES/DRESSINGS) ×3 IMPLANT
BNDG CONFORM 2 STRL LF (GAUZE/BANDAGES/DRESSINGS) ×3 IMPLANT
BNDG ESMARK 4X9 LF (GAUZE/BANDAGES/DRESSINGS) ×3 IMPLANT
CHLORAPREP W/TINT 26ML (MISCELLANEOUS) ×3 IMPLANT
COVER BACK TABLE 60X90IN (DRAPES) ×3 IMPLANT
COVER MAYO STAND STRL (DRAPES) ×3 IMPLANT
CUFF TOURNIQUET SINGLE 18IN (TOURNIQUET CUFF) ×3 IMPLANT
DRAPE EXTREMITY T 121X128X90 (DRAPE) ×3 IMPLANT
DRAPE SURG 17X23 STRL (DRAPES) ×3 IMPLANT
DRSG EMULSION OIL 3X3 NADH (GAUZE/BANDAGES/DRESSINGS) ×3 IMPLANT
GLOVE BIO SURGEON STRL SZ7.5 (GLOVE) ×3 IMPLANT
GLOVE BIOGEL PI IND STRL 7.0 (GLOVE) ×3 IMPLANT
GLOVE BIOGEL PI IND STRL 8 (GLOVE) ×1 IMPLANT
GLOVE BIOGEL PI INDICATOR 7.0 (GLOVE) ×6
GLOVE BIOGEL PI INDICATOR 8 (GLOVE) ×2
GLOVE ECLIPSE 6.5 STRL STRAW (GLOVE) ×6 IMPLANT
GOWN STRL REUS W/ TWL LRG LVL3 (GOWN DISPOSABLE) ×2 IMPLANT
GOWN STRL REUS W/TWL LRG LVL3 (GOWN DISPOSABLE) ×4
GOWN STRL REUS W/TWL XL LVL3 (GOWN DISPOSABLE) ×3 IMPLANT
NDL SAFETY ECLIPSE 18X1.5 (NEEDLE) ×1 IMPLANT
NEEDLE HYPO 18GX1.5 SHARP (NEEDLE) ×2
NEEDLE HYPO 25X1 1.5 SAFETY (NEEDLE) ×3 IMPLANT
NS IRRIG 1000ML POUR BTL (IV SOLUTION) ×3 IMPLANT
PACK BASIN DAY SURGERY FS (CUSTOM PROCEDURE TRAY) ×3 IMPLANT
PADDING CAST ABS 4INX4YD NS (CAST SUPPLIES)
PADDING CAST ABS COTTON 4X4 ST (CAST SUPPLIES) IMPLANT
PADDING CAST COTTON 2X4 NS (CAST SUPPLIES) IMPLANT
SPONGE GAUZE 4X4 12PLY STER LF (GAUZE/BANDAGES/DRESSINGS) ×3 IMPLANT
STOCKINETTE 4X48 STRL (DRAPES) ×3 IMPLANT
STOCKINETTE 6  STRL (DRAPES)
STOCKINETTE 6 STRL (DRAPES) IMPLANT
SUT VICRYL RAPIDE 4-0 (SUTURE) IMPLANT
SUT VICRYL RAPIDE 4/0 PS 2 (SUTURE) ×3 IMPLANT
SYR BULB 3OZ (MISCELLANEOUS) ×3 IMPLANT
SYRINGE 10CC LL (SYRINGE) ×3 IMPLANT
TOWEL OR 17X24 6PK STRL BLUE (TOWEL DISPOSABLE) ×3 IMPLANT
TOWEL OR NON WOVEN STRL DISP B (DISPOSABLE) IMPLANT
UNDERPAD 30X30 INCONTINENT (UNDERPADS AND DIAPERS) IMPLANT

## 2015-02-17 NOTE — Transfer of Care (Signed)
Immediate Anesthesia Transfer of Care Note  Patient: Mary Mullins  Procedure(s) Performed: Procedure(s):  RIGHT LONG FINGER AND RING FINGER TRIGGER RELEASE (Right)  Patient Location: PACU  Anesthesia Type:MAC  Level of Consciousness: awake, alert  and oriented  Airway & Oxygen Therapy: Patient Spontanous Breathing  Post-op Assessment: Report given to RN and Post -op Vital signs reviewed and stable  Post vital signs: Reviewed and stable  Last Vitals:  Filed Vitals:   02/17/15 1001  BP:   Pulse: 70  Temp:   Resp: 29    Complications: No apparent anesthesia complications

## 2015-02-17 NOTE — Anesthesia Preprocedure Evaluation (Addendum)
Anesthesia Evaluation  Patient identified by MRN, date of birth, ID band Patient awake    Airway Mallampati: II  TM Distance: >3 FB Neck ROM: Full    Dental  (+) Teeth Intact, Dental Advisory Given   Pulmonary  breath sounds clear to auscultation        Cardiovascular Rhythm:Regular Rate:Normal     Neuro/Psych    GI/Hepatic   Endo/Other    Renal/GU      Musculoskeletal   Abdominal   Peds  Hematology   Anesthesia Other Findings   Reproductive/Obstetrics                            Anesthesia Physical Anesthesia Plan  ASA: II  Anesthesia Plan: MAC   Post-op Pain Management:    Induction: Intravenous  Airway Management Planned: Natural Airway and Simple Face Mask  Additional Equipment:   Intra-op Plan:   Post-operative Plan:   Informed Consent: I have reviewed the patients History and Physical, chart, labs and discussed the procedure including the risks, benefits and alternatives for the proposed anesthesia with the patient or authorized representative who has indicated his/her understanding and acceptance.   Dental advisory given  Plan Discussed with: CRNA and Anesthesiologist  Anesthesia Plan Comments:         Anesthesia Quick Evaluation

## 2015-02-17 NOTE — Anesthesia Postprocedure Evaluation (Signed)
  Anesthesia Post-op Note  Patient: Mary Mullins  Procedure(s) Performed: Procedure(s):  RIGHT LONG FINGER AND RING FINGER TRIGGER RELEASE (Right)  Patient Location: PACU  Anesthesia Type:General  Level of Consciousness: awake, alert  and oriented  Airway and Oxygen Therapy: Patient Spontanous Breathing and Patient connected to nasal cannula oxygen  Post-op Pain: none  Post-op Assessment: Post-op Vital signs reviewed, Patient's Cardiovascular Status Stable, Respiratory Function Stable, Patent Airway and Pain level controlled  Post-op Vital Signs: stable  Last Vitals:  Filed Vitals:   02/17/15 1001  BP: 113/66  Pulse: 70  Temp: 36.4 C  Resp: 29    Complications: No apparent anesthesia complications

## 2015-02-17 NOTE — Op Note (Signed)
02/17/2015  9:01 AM  PATIENT:  Mary Mullins  69 y.o. 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 Grandville Silos, MD  PHYSICIAN ASSISTANT: Morley Kos, OPA-C  ANESTHESIA:  local and MAC  SPECIMENS:  None  DRAINS:   None  EBL:  less than 50 mL  PREOPERATIVE INDICATIONS:  Mary Mullins is a  69 y.o. female with right long and ring finger trigger digits that only responded transiently to nonoperative management with steroid injection.  The risks benefits and alternatives were discussed with the patient preoperatively including but not limited to the risks of infection, bleeding, nerve injury, cardiopulmonary complications, the need for revision surgery, among others, and the patient verbalized understanding and consented to proceed.  OPERATIVE IMPLANTS: None  OPERATIVE PROCEDURE:  After receiving prophylactic antibiotics, the patient was escorted to the operative theatre and placed in a supine position. The incisions were marked and anesthetized with a mixture of lidocaine and Marcaine bearing epinephrine. A surgical "time-out" was performed during which the planned procedure, proposed operative site, and the correct patient identity were compared to the operative consent and agreement confirmed by the circulating nurse according to current facility policy.  Following application of a tourniquet to the operative extremity, the exposed skin was prepped with Chloraprep and draped in the usual sterile fashion.  The limb was exsanguinated with an Esmarch bandage and the tourniquet inflated to approximately 176mmHg higher than systolic BP.  The ring finger was addressed first. The incision was made sharply with a scalpel, subcutaneous tissues were dissected with blunt and spreading dissection. The A1 pulley was identified and released down the  midline retracting the neurovascular bundles radially and ulnarly. In addition, some crossing bands proximal to this were also split to ensure that there was no proximal entrapment of the tendon. The tendons were pulled into view and cleaned of some thickened tenosynovium upon them. There was some longitudinal split tearing and fraying of the FDP tendon. The tendon was returned to its bed. The long finger was addressed in the same fashion, and the changes in the tendon were not as pronounced as they were in the ring finger. Tourniquet was released the wounds were irrigated and skin closed with 4-0 Vicryl Rapide interrupted sutures. A light dressing was applied and she was taken to the recovery room in stable condition.  DISPOSITION: She will be discharged home today with typical instructions returning in 7-10 days.

## 2015-02-17 NOTE — Discharge Instructions (Addendum)
Discharge Instructions   You have a light dressing on your hand.  You may begin gentle motion of your fingers and hand immediately, but you should not do any heavy lifting or gripping.  Elevate your hand to reduce pain & swelling of the digits.  Ice over the operative site may be helpful to reduce pain & swelling.  DO NOT USE HEAT. Pain medicine has been prescribed for you.  Use your medicine as needed over the first 48 hours, and then you can begin to taper your use. You may use Tylenol in place of your prescribed pain medication, but not IN ADDITION to it. Leave the dressing in place until the third day after your surgery and then remove it, leaving it open to air.  After the bandage has been removed you may shower, regularly washing the incision and letting the water run over it, but not submerging it (no swimming, soaking it in dishwater, etc.) You may drive a car when you are off of prescription pain medications and can safely control your vehicle with both hands. We will address whether therapy will be required or not when you return to the office. You may have already made your follow-up appointment when we completed your preop visit.  If not, please call our office today or the next business day to make your return appointment for 7-10 days after surgery.   Please call [phone] during normal business hours or [phone] after hours for any problems. Including the following:  - excessive redness of the incisions - drainage for more than 4 days - fever of more than 101.5 F  *Please note that pain medications will not be refilled after hours or on weekends.    Post Anesthesia Home Care Instructions  Activity: Get plenty of rest for the remainder of the day. A responsible adult should stay with you for 24 hours following the procedure.  For the next 24 hours, DO NOT: -Drive a car -Paediatric nurse -Drink alcoholic beverages -Take any medication unless instructed by your  physician -Make any legal decisions or sign important papers.  Meals: Start with liquid foods such as gelatin or soup. Progress to regular foods as tolerated. Avoid greasy, spicy, heavy foods. If nausea and/or vomiting occur, drink only clear liquids until the nausea and/or vomiting subsides. Call your physician if vomiting continues.  Special Instructions/Symptoms: Your throat may feel dry or sore from the anesthesia or the breathing tube placed in your throat during surgery. If this causes discomfort, gargle with warm salt water. The discomfort should disappear within 24 hours.  If you had a scopolamine patch placed behind your ear for the management of post- operative nausea and/or vomiting:  1. The medication in the patch is effective for 72 hours, after which it should be removed.  Wrap patch in a tissue and discard in the trash. Wash hands thoroughly with soap and water. 2. You may remove the patch earlier than 72 hours if you experience unpleasant side effects which may include dry mouth, dizziness or visual disturbances. 3. Avoid touching the patch. Wash your hands with soap and water after contact with the patch.

## 2015-02-17 NOTE — Interval H&P Note (Signed)
History and Physical Interval Note:  02/17/2015 8:58 AM  Mary Mullins  has presented today for surgery, with the diagnosis of RIGHT LONG FINGER AND RING FINGER TRIGGER DIGITS  The various methods of treatment have been discussed with the patient and family. After consideration of risks, benefits and other options for treatment, the patient has consented to  Procedure(s):  RIGHT LONG FINGER AND RING FINGER TRIGGER RELEASE (Right) as a surgical intervention .  The patient's history has been reviewed, patient examined, no change in status, stable for surgery.  I have reviewed the patient's chart and labs.  Questions were answered to the patient's satisfaction.     [name] A.

## 2015-02-19 ENCOUNTER — Encounter (HOSPITAL_BASED_OUTPATIENT_CLINIC_OR_DEPARTMENT_OTHER): Payer: Self-pay | Admitting: Orthopedic Surgery

## 2015-02-26 DIAGNOSIS — M65341 Trigger finger, right ring finger: Secondary | ICD-10-CM | POA: Diagnosis not present

## 2015-02-26 DIAGNOSIS — M65331 Trigger finger, right middle finger: Secondary | ICD-10-CM | POA: Diagnosis not present

## 2015-06-04 DIAGNOSIS — M65341 Trigger finger, right ring finger: Secondary | ICD-10-CM | POA: Diagnosis not present

## 2015-06-04 DIAGNOSIS — M65331 Trigger finger, right middle finger: Secondary | ICD-10-CM | POA: Diagnosis not present

## 2015-07-08 DIAGNOSIS — M65331 Trigger finger, right middle finger: Secondary | ICD-10-CM | POA: Diagnosis not present

## 2015-07-14 DIAGNOSIS — S143XXA Injury of brachial plexus, initial encounter: Secondary | ICD-10-CM | POA: Diagnosis not present

## 2015-07-14 DIAGNOSIS — G540 Brachial plexus disorders: Secondary | ICD-10-CM | POA: Diagnosis not present

## 2015-07-17 DIAGNOSIS — S143XXA Injury of brachial plexus, initial encounter: Secondary | ICD-10-CM | POA: Diagnosis not present

## 2015-07-17 DIAGNOSIS — G540 Brachial plexus disorders: Secondary | ICD-10-CM | POA: Diagnosis not present

## 2015-07-21 DIAGNOSIS — G540 Brachial plexus disorders: Secondary | ICD-10-CM | POA: Diagnosis not present

## 2015-07-21 DIAGNOSIS — S143XXA Injury of brachial plexus, initial encounter: Secondary | ICD-10-CM | POA: Diagnosis not present

## 2015-07-28 DIAGNOSIS — G540 Brachial plexus disorders: Secondary | ICD-10-CM | POA: Diagnosis not present

## 2015-07-28 DIAGNOSIS — S143XXA Injury of brachial plexus, initial encounter: Secondary | ICD-10-CM | POA: Diagnosis not present

## 2015-07-31 DIAGNOSIS — S143XXA Injury of brachial plexus, initial encounter: Secondary | ICD-10-CM | POA: Diagnosis not present

## 2015-07-31 DIAGNOSIS — G540 Brachial plexus disorders: Secondary | ICD-10-CM | POA: Diagnosis not present

## 2015-11-27 DIAGNOSIS — G8929 Other chronic pain: Secondary | ICD-10-CM | POA: Diagnosis not present

## 2015-11-27 DIAGNOSIS — M5442 Lumbago with sciatica, left side: Secondary | ICD-10-CM | POA: Diagnosis not present

## 2015-11-27 DIAGNOSIS — R079 Chest pain, unspecified: Secondary | ICD-10-CM | POA: Diagnosis not present

## 2015-11-27 DIAGNOSIS — M5441 Lumbago with sciatica, right side: Secondary | ICD-10-CM | POA: Diagnosis not present

## 2015-11-27 DIAGNOSIS — R5383 Other fatigue: Secondary | ICD-10-CM | POA: Diagnosis not present

## 2015-11-27 DIAGNOSIS — R0602 Shortness of breath: Secondary | ICD-10-CM | POA: Diagnosis not present

## 2015-12-10 DIAGNOSIS — M4856XD Collapsed vertebra, not elsewhere classified, lumbar region, subsequent encounter for fracture with routine healing: Secondary | ICD-10-CM | POA: Diagnosis not present

## 2015-12-10 DIAGNOSIS — M4856XA Collapsed vertebra, not elsewhere classified, lumbar region, initial encounter for fracture: Secondary | ICD-10-CM | POA: Diagnosis not present

## 2015-12-10 DIAGNOSIS — M4726 Other spondylosis with radiculopathy, lumbar region: Secondary | ICD-10-CM | POA: Diagnosis not present

## 2015-12-10 DIAGNOSIS — M5116 Intervertebral disc disorders with radiculopathy, lumbar region: Secondary | ICD-10-CM | POA: Diagnosis not present

## 2015-12-10 DIAGNOSIS — M4727 Other spondylosis with radiculopathy, lumbosacral region: Secondary | ICD-10-CM | POA: Diagnosis not present

## 2015-12-16 DIAGNOSIS — M5416 Radiculopathy, lumbar region: Secondary | ICD-10-CM | POA: Diagnosis not present

## 2015-12-16 DIAGNOSIS — M4806 Spinal stenosis, lumbar region: Secondary | ICD-10-CM | POA: Diagnosis not present

## 2015-12-31 DIAGNOSIS — M5412 Radiculopathy, cervical region: Secondary | ICD-10-CM | POA: Diagnosis not present

## 2015-12-31 DIAGNOSIS — G5621 Lesion of ulnar nerve, right upper limb: Secondary | ICD-10-CM | POA: Diagnosis not present

## 2015-12-31 DIAGNOSIS — G603 Idiopathic progressive neuropathy: Secondary | ICD-10-CM | POA: Diagnosis not present

## 2015-12-31 DIAGNOSIS — G5603 Carpal tunnel syndrome, bilateral upper limbs: Secondary | ICD-10-CM | POA: Diagnosis not present

## 2015-12-31 DIAGNOSIS — M5441 Lumbago with sciatica, right side: Secondary | ICD-10-CM | POA: Diagnosis not present

## 2015-12-31 DIAGNOSIS — M5417 Radiculopathy, lumbosacral region: Secondary | ICD-10-CM | POA: Diagnosis not present

## 2015-12-31 DIAGNOSIS — M5442 Lumbago with sciatica, left side: Secondary | ICD-10-CM | POA: Diagnosis not present

## 2015-12-31 DIAGNOSIS — R202 Paresthesia of skin: Secondary | ICD-10-CM | POA: Diagnosis not present

## 2015-12-31 DIAGNOSIS — R634 Abnormal weight loss: Secondary | ICD-10-CM | POA: Diagnosis not present

## 2015-12-31 DIAGNOSIS — G609 Hereditary and idiopathic neuropathy, unspecified: Secondary | ICD-10-CM | POA: Diagnosis not present

## 2016-04-12 DIAGNOSIS — D2339 Other benign neoplasm of skin of other parts of face: Secondary | ICD-10-CM | POA: Diagnosis not present

## 2016-04-12 DIAGNOSIS — D485 Neoplasm of uncertain behavior of skin: Secondary | ICD-10-CM | POA: Diagnosis not present

## 2016-06-17 DIAGNOSIS — L241 Irritant contact dermatitis due to oils and greases: Secondary | ICD-10-CM | POA: Diagnosis not present

## 2016-06-18 ENCOUNTER — Other Ambulatory Visit: Payer: Self-pay

## 2016-09-10 DIAGNOSIS — Z0000 Encounter for general adult medical examination without abnormal findings: Secondary | ICD-10-CM | POA: Diagnosis not present

## 2016-10-01 ENCOUNTER — Encounter (HOSPITAL_COMMUNITY): Payer: Self-pay | Admitting: Emergency Medicine

## 2016-10-01 ENCOUNTER — Emergency Department (HOSPITAL_COMMUNITY): Payer: Medicare Other

## 2016-10-01 ENCOUNTER — Emergency Department (HOSPITAL_COMMUNITY)
Admission: EM | Admit: 2016-10-01 | Discharge: 2016-10-01 | Disposition: A | Discharge status: Home / Self Care | Payer: Medicare Other | Attending: Emergency Medicine | Admitting: Emergency Medicine

## 2016-10-01 DIAGNOSIS — S42291A Other displaced fracture of upper end of right humerus, initial encounter for closed fracture: Secondary | ICD-10-CM | POA: Diagnosis not present

## 2016-10-01 DIAGNOSIS — Y929 Unspecified place or not applicable: Secondary | ICD-10-CM | POA: Diagnosis not present

## 2016-10-01 DIAGNOSIS — Z79899 Other long term (current) drug therapy: Secondary | ICD-10-CM | POA: Diagnosis not present

## 2016-10-01 DIAGNOSIS — S4991XA Unspecified injury of right shoulder and upper arm, initial encounter: Secondary | ICD-10-CM | POA: Diagnosis present

## 2016-10-01 DIAGNOSIS — S42294A Other nondisplaced fracture of upper end of right humerus, initial encounter for closed fracture: Secondary | ICD-10-CM | POA: Diagnosis not present

## 2016-10-01 DIAGNOSIS — W009XXA Unspecified fall due to ice and snow, initial encounter: Secondary | ICD-10-CM | POA: Insufficient documentation

## 2016-10-01 DIAGNOSIS — Y939 Activity, unspecified: Secondary | ICD-10-CM | POA: Insufficient documentation

## 2016-10-01 DIAGNOSIS — Y999 Unspecified external cause status: Secondary | ICD-10-CM | POA: Diagnosis not present

## 2016-10-01 IMAGING — CR DG SHOULDER 2+V*R*
2 series · 2 of 2 positions shown · non-contrast
Comparison: None.

CLINICAL DATA: Fall, right shoulder pain

EXAM:
RIGHT SHOULDER - 2+ VIEW

[w shoulder external right]
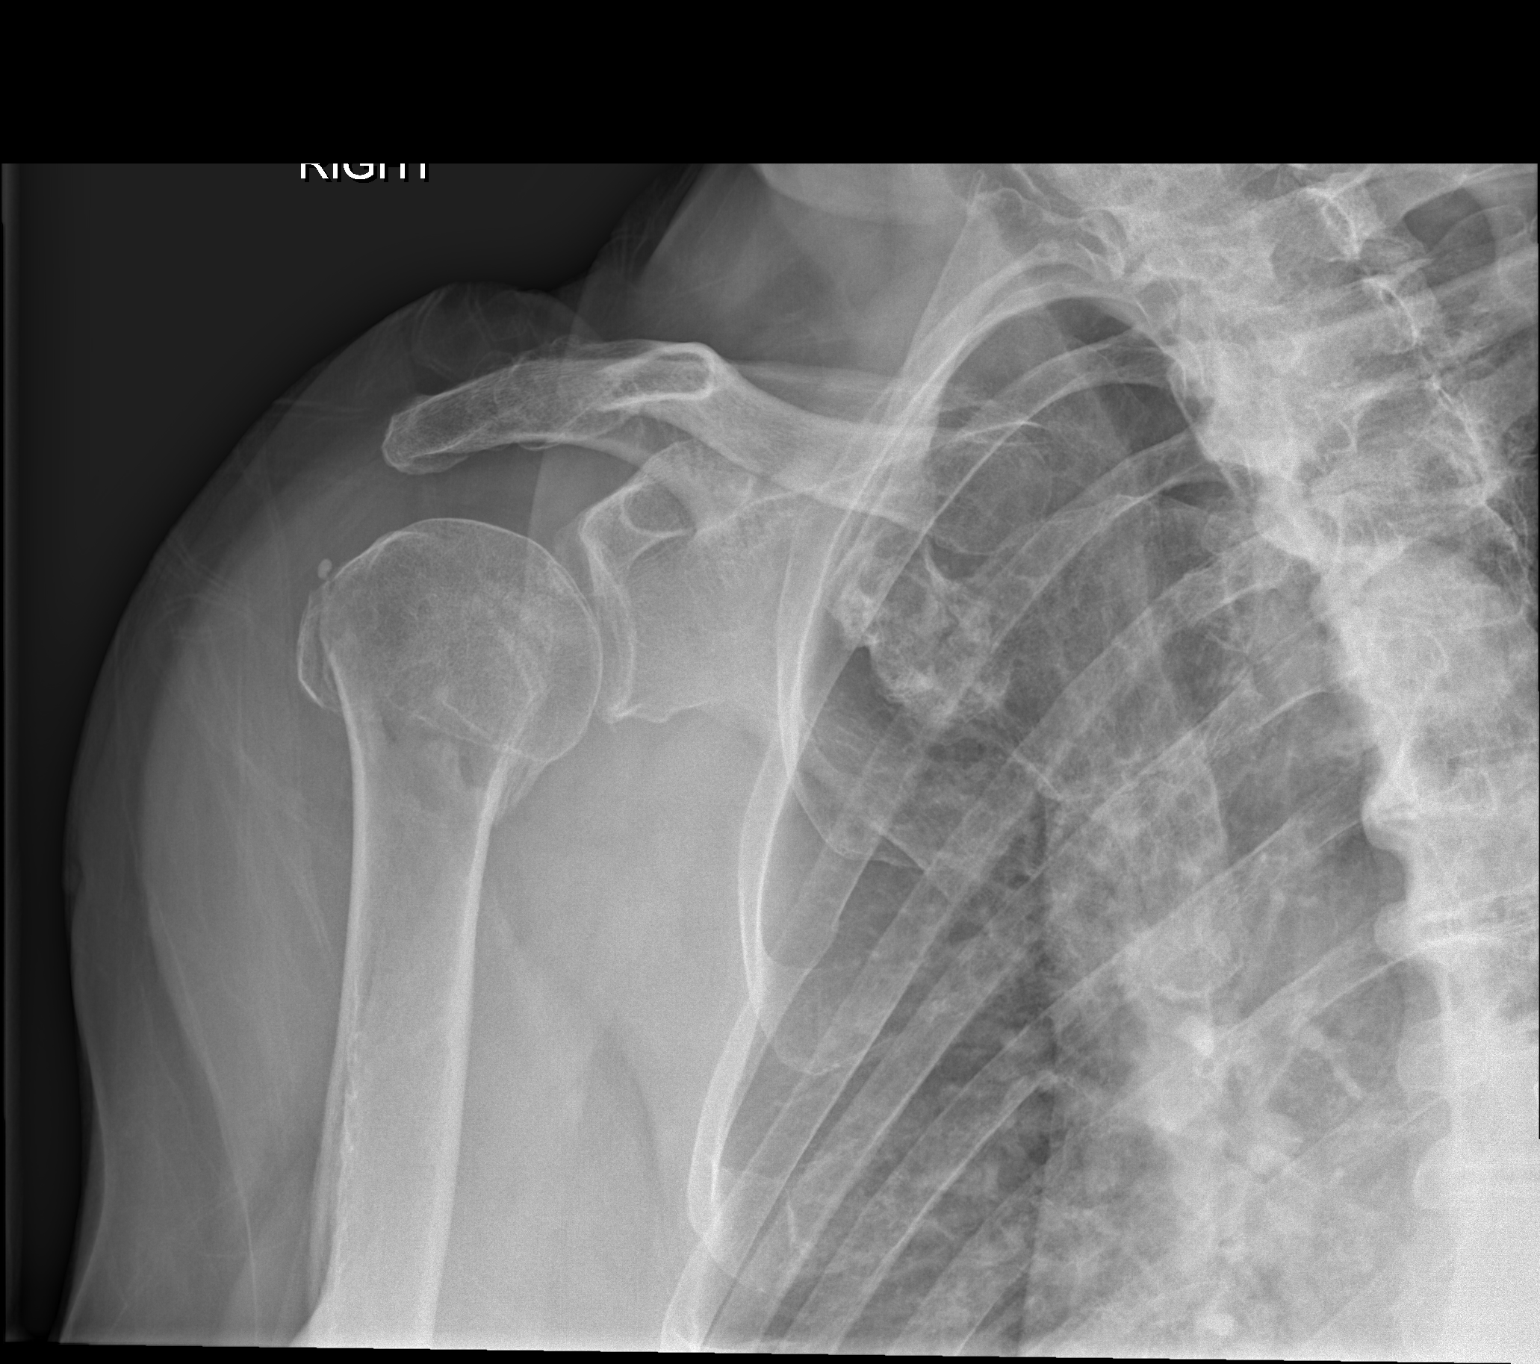

[w shoulder y-view right]
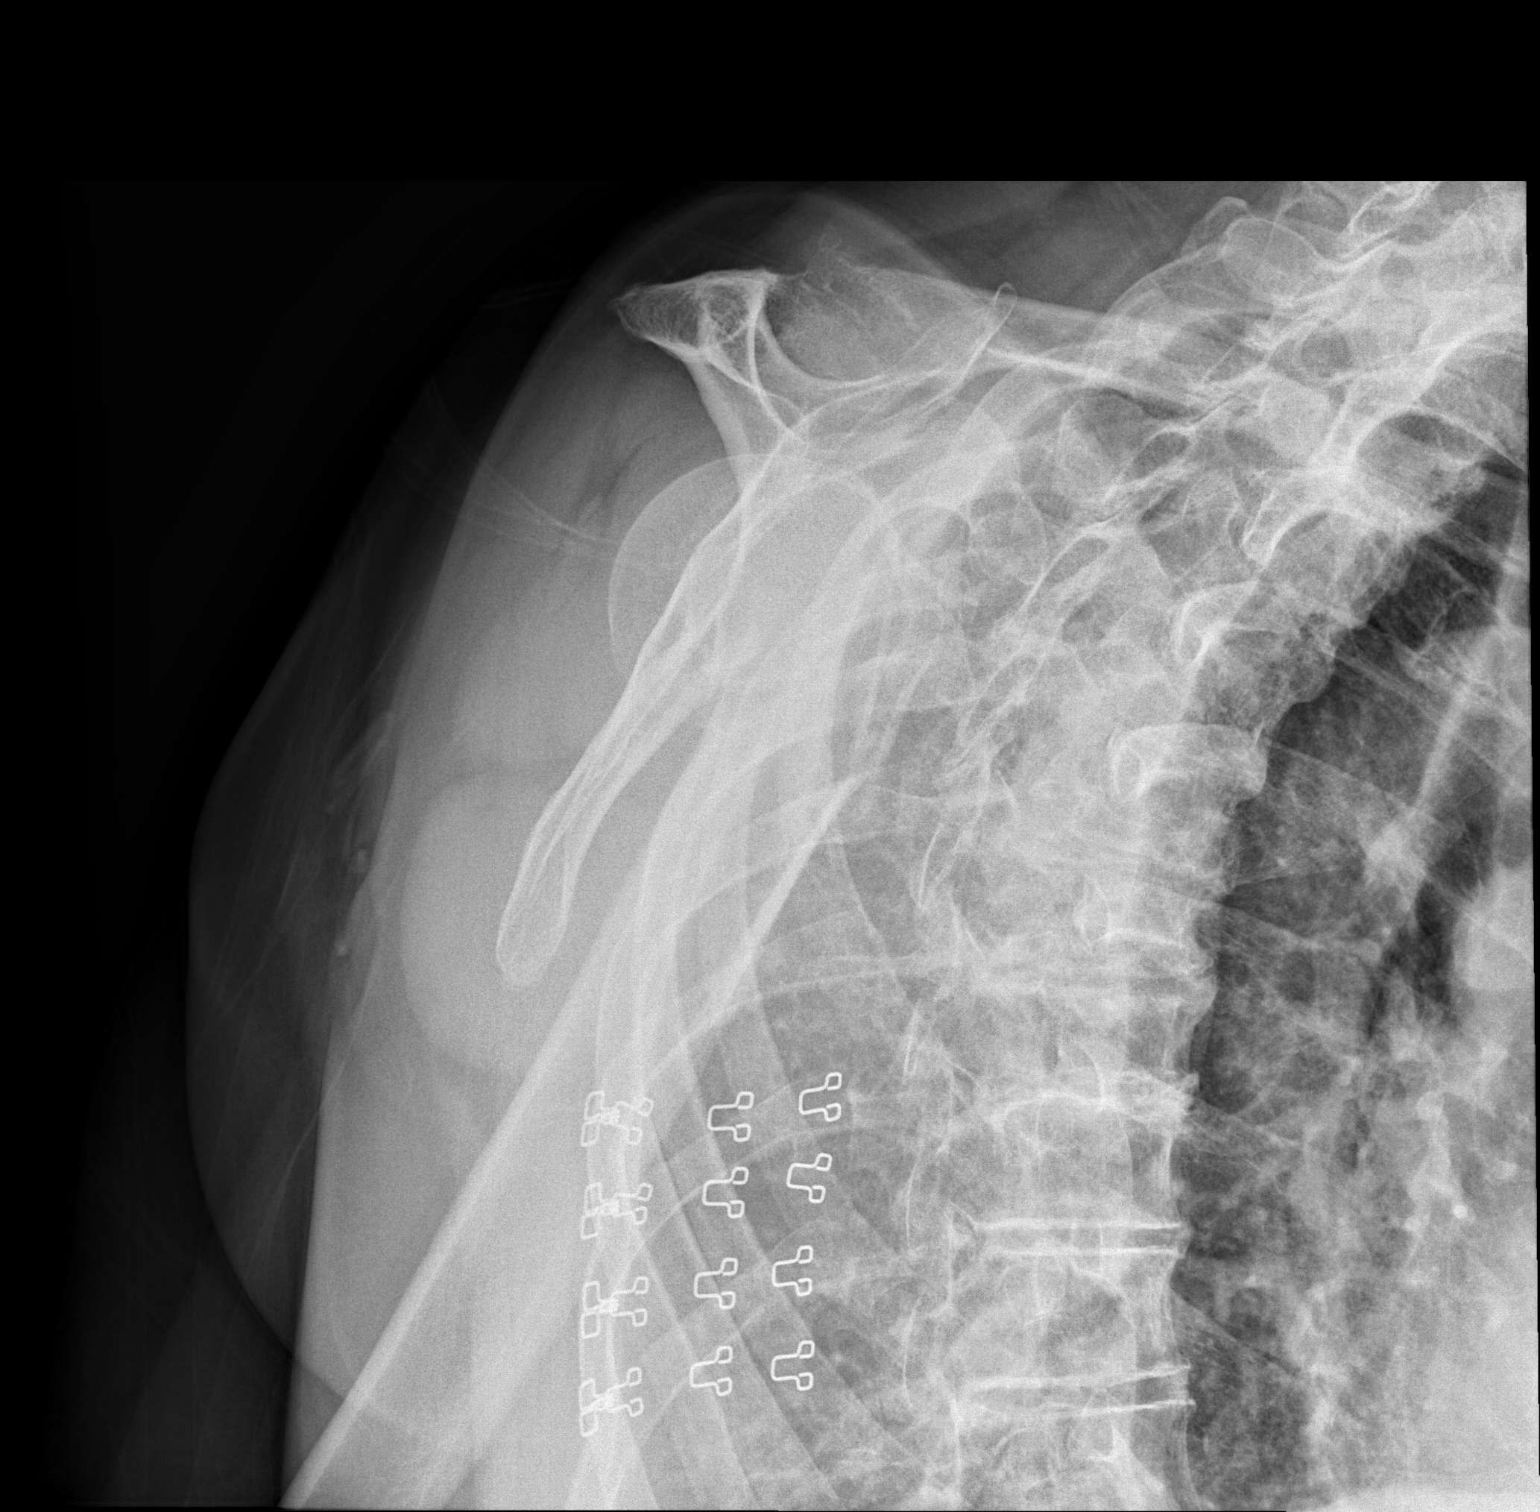

[2 of 2 positions shown; findings below may reference images not displayed]

FINDINGS: Transverse humeral neck fracture. Humeral shaft is mildly anteriorly
displaced relative to the humeral head.

Mild degenerative changes of the glenohumeral joint.

The visualized soft tissues are unremarkable.

Visualized right lung is clear.
IMPRESSION: Transverse humeral neck fracture.

## 2016-10-01 MED ORDER — HYDROCODONE-ACETAMINOPHEN 5-325 MG PO TABS: 1.0000 | ORAL_TABLET | ORAL | 0 refills | Status: DC | PRN

## 2016-10-01 MED ORDER — HYDROCODONE-ACETAMINOPHEN 5-325 MG PO TABS
2.0000 | ORAL_TABLET | Freq: Once | ORAL | Status: AC
  Administered 2016-10-01: 2 via ORAL
  Filled 2016-10-01: qty 2

## 2016-10-01 NOTE — ED Triage Notes (Signed)
Patient c/o right shoulder pain after slipping on ice. Denies head injury and LOC. Movement and sensation to right hand.

## 2016-10-01 NOTE — ED Provider Notes (Signed)
[facility] DEPT Provider Note   CSN: [id number] Arrival date & time: 10/01/16  1829     History   Chief Complaint Chief Complaint  Patient presents with  . Shoulder Injury    HPI Mary Mullins is a 70 y.o. female.  HPI  70 year old female presents with a right shoulder injury after slipping and falling. She was taking food your horses when she slipped and fell on her elbow. She is having pain in her right shoulder. Denies pain in her elbow, forearm, or hand. No weakness or numbness. Did not hit her head. Pain is severe. She feels like her shoulder is swollen.  Past Medical History:  Diagnosis Date  . 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   Past Surgical History:  Procedure Laterality Date  . ANKLE FRACTURE SURGERY     L ankle  . EYE SURGERY    . TRIGGER FINGER RELEASE Right 02/17/2015   Procedure:  RIGHT LONG FINGER AND RING FINGER TRIGGER RELEASE;  Surgeon: Milly Jakob, MD;  Location: [facility];  Service: Orthopedics;  Laterality: Right;  . WRIST FRACTURE SURGERY     R wrist  . WRIST SURGERY Right 2007    OB History    No data available       Home Medications    Prior to Admission medications   Medication Sig Start Date End Date Taking? Authorizing Provider  Biotin 10000 MCG TABS Take 1,000 mg by mouth daily.   Yes Historical Provider, MD  ibuprofen (ADVIL,MOTRIN) 200 MG tablet Take 400 mg by mouth every 6 (six) hours as needed for headache, mild pain or moderate pain.   Yes Historical Provider, MD  Vitamins A & D (VITAMIN A & D PO) Take 1 tablet by mouth daily.   Yes Historical Provider, MD  HYDROcodone-acetaminophen (NORCO) 5-325 MG tablet Take 1-2 tablets by mouth every 4 (four) hours as needed for severe pain. 10/01/16   Sherwood Gambler, MD    Family History Family History  Problem Relation Age of Onset  . Heart Problems Mother   . Cirrhosis  Father     Social History Social History  Substance Use Topics  . Smoking status: Never Smoker  . Smokeless tobacco: Never Used  . Alcohol use 0.0 oz/week     Comment: 1 glass of wine daily     Allergies   Patient has no known allergies.   Review of Systems Review of Systems  Musculoskeletal: Positive for arthralgias and joint swelling.  Neurological: Negative for weakness and numbness.  All other systems reviewed and are negative.    Physical Exam Updated Vital Signs BP 146/88 (BP Location: Left Arm)   Pulse 80   Temp 98.4 F (36.9 C) (Oral)   Resp 20   SpO2 100%   Physical Exam  Constitutional: She is oriented to person, place, and time. She appears well-developed and well-nourished.  HENT:  Head: Normocephalic and atraumatic.  Right Ear: External ear normal.  Left Ear: External ear normal.  Nose: Nose normal.  Eyes: Right eye exhibits no discharge. Left eye exhibits no discharge.  Cardiovascular: Normal rate and regular rhythm.   Pulses:      Radial pulses are 2+ on the right side.  Pulmonary/Chest: Effort normal.  Abdominal: She exhibits no distension.  Musculoskeletal:       Right shoulder: She exhibits decreased range of motion, tenderness and  bony tenderness. She exhibits no deformity.       Right elbow: She exhibits no deformity. No tenderness found.       Right wrist: She exhibits no tenderness.       Right upper arm: She exhibits no tenderness.       Right hand: She exhibits no tenderness.  Normal strength and sensation in right hand  Neurological: She is alert and oriented to person, place, and time.  Skin: Skin is warm and dry.  Nursing note and vitals reviewed.    ED Treatments / Results  Labs (all labs ordered are listed, but only abnormal results are displayed) Labs Reviewed - No data to display  EKG  EKG Interpretation None       Radiology Dg Shoulder Right  Result Date: 10/01/2016 CLINICAL DATA:  Fall, right shoulder pain  EXAM: RIGHT SHOULDER - 2+ VIEW COMPARISON:  None. FINDINGS: Transverse humeral neck fracture. Humeral shaft is mildly anteriorly displaced relative to the humeral head. Mild degenerative changes of the glenohumeral joint. The visualized soft tissues are unremarkable. Visualized right lung is clear. IMPRESSION: Transverse humeral neck fracture. Electronically Signed   By: Julian Hy M.D.   On: 10/01/2016 19:04    Procedures Procedures (including critical care time)  Medications Ordered in ED Medications  HYDROcodone-acetaminophen (NORCO/VICODIN) 5-325 MG per tablet 2 tablet (2 tablets Oral Given 10/01/16 2011)     Initial Impression / Assessment and Plan / ED Course  I have reviewed the triage vital signs and the nursing notes.  Pertinent labs & imaging results that were available during my care of the patient were reviewed by me and considered in my medical decision making (see chart for details).  Clinical Course     Patient with a right proximal humerus fracture as above. No other signs of injury on exam. Neurovascularly intact. Placed in a sling and will be given orthopedic follow-up. Given hydrocodone here and is a prescription. Discussed return precautions.  Final Clinical Impressions(s) / ED Diagnoses   Final diagnoses:  Other closed nondisplaced fracture of proximal end of right humerus, initial encounter    New Prescriptions New Prescriptions   HYDROCODONE-ACETAMINOPHEN (NORCO) 5-325 MG TABLET    Take 1-2 tablets by mouth every 4 (four) hours as needed for severe pain.     Sherwood Gambler, MD 10/01/16 2016

## 2016-10-07 DIAGNOSIS — S42294A Other nondisplaced fracture of upper end of right humerus, initial encounter for closed fracture: Secondary | ICD-10-CM | POA: Diagnosis not present

## 2016-10-14 DIAGNOSIS — S42294D Other nondisplaced fracture of upper end of right humerus, subsequent encounter for fracture with routine healing: Secondary | ICD-10-CM | POA: Diagnosis not present

## 2020-01-10 DIAGNOSIS — L853 Xerosis cutis: Secondary | ICD-10-CM | POA: Diagnosis not present

## 2020-01-10 DIAGNOSIS — E785 Hyperlipidemia, unspecified: Secondary | ICD-10-CM | POA: Diagnosis not present

## 2020-01-10 DIAGNOSIS — Z0000 Encounter for general adult medical examination without abnormal findings: Secondary | ICD-10-CM | POA: Diagnosis not present

## 2020-01-10 DIAGNOSIS — R1032 Left lower quadrant pain: Secondary | ICD-10-CM | POA: Diagnosis not present

## 2020-01-10 DIAGNOSIS — K5900 Constipation, unspecified: Secondary | ICD-10-CM | POA: Diagnosis not present

## 2020-01-10 DIAGNOSIS — M48061 Spinal stenosis, lumbar region without neurogenic claudication: Secondary | ICD-10-CM | POA: Diagnosis not present

## 2020-01-12 DIAGNOSIS — Z1159 Encounter for screening for other viral diseases: Secondary | ICD-10-CM | POA: Diagnosis not present

## 2020-01-12 DIAGNOSIS — E785 Hyperlipidemia, unspecified: Secondary | ICD-10-CM | POA: Diagnosis not present

## 2020-01-12 DIAGNOSIS — R1032 Left lower quadrant pain: Secondary | ICD-10-CM | POA: Diagnosis not present

## 2020-01-12 DIAGNOSIS — K5900 Constipation, unspecified: Secondary | ICD-10-CM | POA: Diagnosis not present

## 2020-02-26 ENCOUNTER — Ambulatory Visit: Payer: 59 | Admitting: Family Medicine

## 2020-04-14 DIAGNOSIS — R109 Unspecified abdominal pain: Secondary | ICD-10-CM | POA: Diagnosis not present

## 2020-04-14 DIAGNOSIS — G8929 Other chronic pain: Secondary | ICD-10-CM | POA: Diagnosis not present

## 2020-04-14 DIAGNOSIS — J309 Allergic rhinitis, unspecified: Secondary | ICD-10-CM | POA: Diagnosis not present

## 2020-04-14 DIAGNOSIS — E785 Hyperlipidemia, unspecified: Secondary | ICD-10-CM | POA: Diagnosis not present

## 2020-04-14 DIAGNOSIS — R0683 Snoring: Secondary | ICD-10-CM | POA: Diagnosis not present

## 2020-04-14 DIAGNOSIS — K5900 Constipation, unspecified: Secondary | ICD-10-CM | POA: Diagnosis not present

## 2020-04-14 DIAGNOSIS — M545 Low back pain: Secondary | ICD-10-CM | POA: Diagnosis not present

## 2020-04-17 DIAGNOSIS — E785 Hyperlipidemia, unspecified: Secondary | ICD-10-CM | POA: Diagnosis not present

## 2020-04-17 DIAGNOSIS — R109 Unspecified abdominal pain: Secondary | ICD-10-CM | POA: Diagnosis not present

## 2020-04-18 DIAGNOSIS — R109 Unspecified abdominal pain: Secondary | ICD-10-CM | POA: Diagnosis not present

## 2020-04-21 DIAGNOSIS — E785 Hyperlipidemia, unspecified: Secondary | ICD-10-CM | POA: Diagnosis not present

## 2020-04-21 DIAGNOSIS — K5900 Constipation, unspecified: Secondary | ICD-10-CM | POA: Diagnosis not present

## 2020-04-21 DIAGNOSIS — R1032 Left lower quadrant pain: Secondary | ICD-10-CM | POA: Diagnosis not present

## 2020-04-21 DIAGNOSIS — M545 Low back pain: Secondary | ICD-10-CM | POA: Diagnosis not present

## 2020-04-21 DIAGNOSIS — G8929 Other chronic pain: Secondary | ICD-10-CM | POA: Diagnosis not present

## 2020-04-21 DIAGNOSIS — R911 Solitary pulmonary nodule: Secondary | ICD-10-CM | POA: Diagnosis not present

## 2020-04-23 DIAGNOSIS — M9905 Segmental and somatic dysfunction of pelvic region: Secondary | ICD-10-CM | POA: Diagnosis not present

## 2020-04-23 DIAGNOSIS — M545 Low back pain: Secondary | ICD-10-CM | POA: Diagnosis not present

## 2020-04-23 DIAGNOSIS — M546 Pain in thoracic spine: Secondary | ICD-10-CM | POA: Diagnosis not present

## 2020-04-23 DIAGNOSIS — M9903 Segmental and somatic dysfunction of lumbar region: Secondary | ICD-10-CM | POA: Diagnosis not present

## 2020-04-23 DIAGNOSIS — G8929 Other chronic pain: Secondary | ICD-10-CM | POA: Diagnosis not present

## 2020-04-23 DIAGNOSIS — M9902 Segmental and somatic dysfunction of thoracic region: Secondary | ICD-10-CM | POA: Diagnosis not present

## 2020-04-24 DIAGNOSIS — R918 Other nonspecific abnormal finding of lung field: Secondary | ICD-10-CM | POA: Diagnosis not present

## 2020-05-29 ENCOUNTER — Other Ambulatory Visit: Payer: Self-pay | Admitting: Family Medicine

## 2020-05-29 DIAGNOSIS — G8929 Other chronic pain: Secondary | ICD-10-CM | POA: Diagnosis not present

## 2020-05-29 DIAGNOSIS — J329 Chronic sinusitis, unspecified: Secondary | ICD-10-CM | POA: Diagnosis not present

## 2020-05-29 DIAGNOSIS — R0683 Snoring: Secondary | ICD-10-CM | POA: Diagnosis not present

## 2020-05-29 DIAGNOSIS — M546 Pain in thoracic spine: Secondary | ICD-10-CM | POA: Diagnosis not present

## 2020-05-29 DIAGNOSIS — R0982 Postnasal drip: Secondary | ICD-10-CM | POA: Diagnosis not present

## 2020-05-29 DIAGNOSIS — M545 Low back pain: Secondary | ICD-10-CM | POA: Diagnosis not present

## 2020-06-03 ENCOUNTER — Ambulatory Visit
Admission: RE | Admit: 2020-06-03 | Discharge: 2020-06-03 | Disposition: A | Discharge status: Home / Self Care | Payer: PPO | Source: Ambulatory Visit | Attending: Family Medicine | Admitting: Family Medicine

## 2020-06-03 ENCOUNTER — Other Ambulatory Visit: Payer: Self-pay

## 2020-06-03 DIAGNOSIS — J320 Chronic maxillary sinusitis: Secondary | ICD-10-CM | POA: Diagnosis not present

## 2020-06-03 DIAGNOSIS — J329 Chronic sinusitis, unspecified: Secondary | ICD-10-CM

## 2020-06-03 DIAGNOSIS — J341 Cyst and mucocele of nose and nasal sinus: Secondary | ICD-10-CM | POA: Diagnosis not present

## 2020-06-03 DIAGNOSIS — R0982 Postnasal drip: Secondary | ICD-10-CM | POA: Diagnosis not present

## 2020-06-03 DIAGNOSIS — J3489 Other specified disorders of nose and nasal sinuses: Secondary | ICD-10-CM | POA: Diagnosis not present

## 2020-06-03 IMAGING — CT CT MAXILLOFACIAL W/O CM
1 series · 15 of 30 positions shown, 19 images · non-contrast
Comparison: None.

CLINICAL DATA: Postnasal drip. Chronic sinusitis. Patient reports
left-sided facial pressure.

EXAM:
CT MAXILLOFACIAL WITHOUT CONTRAST
TECHNIQUE: Multidetector CT imaging of the maxillofacial structures was
performed. Multiplanar CT image reconstructions were also generated.

[Series 4: maxofacial soft · axial · 0.40mm/px · z∈[-2,+88]mm · 15 of 49 slices shown, 19 images]
[im 2/49  brain]
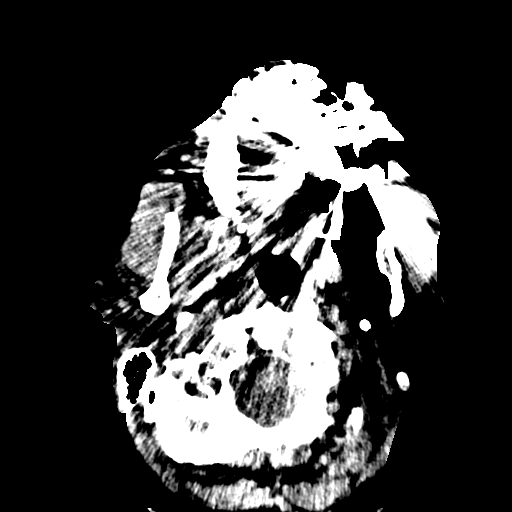
[im 2/49  bone]
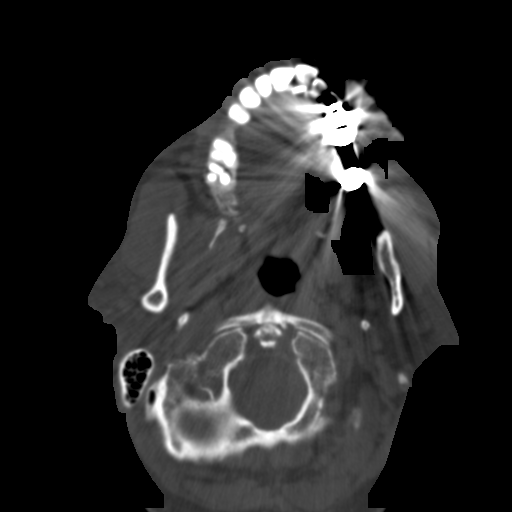
[im 5/49  bone]
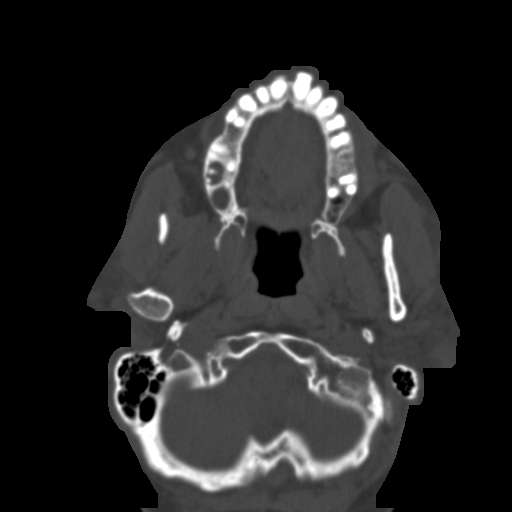
[im 9/49  bone]
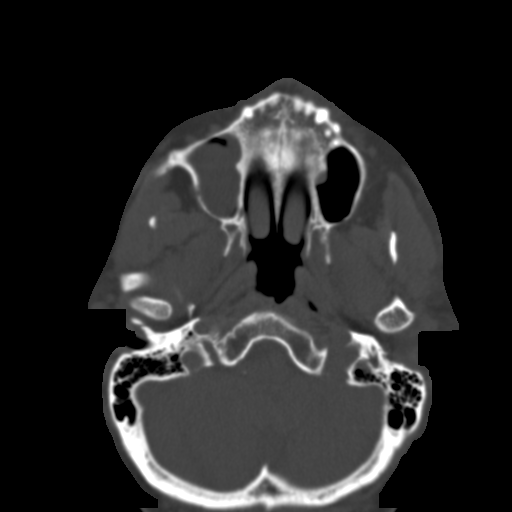
[im 12/49  bone]
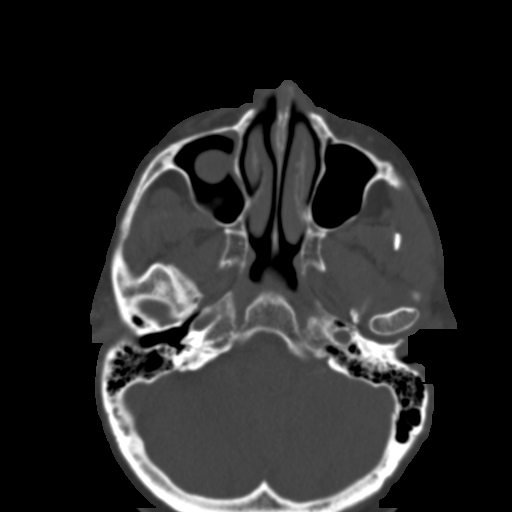
[im 15/49  brain]
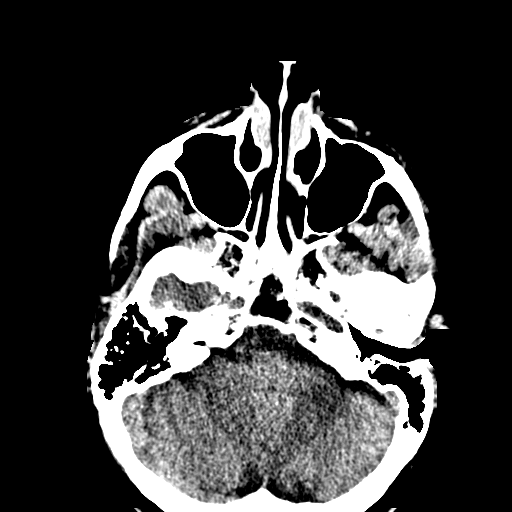
[im 15/49  bone]
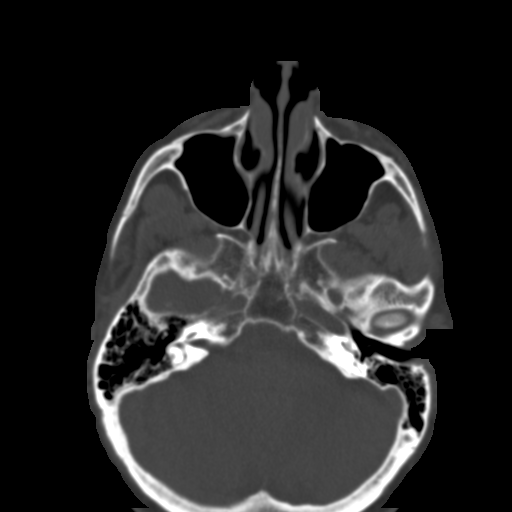
[im 19/49  bone]
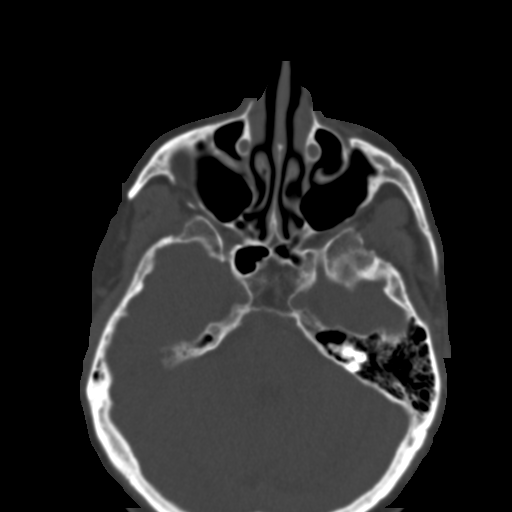
[im 22/49  bone]
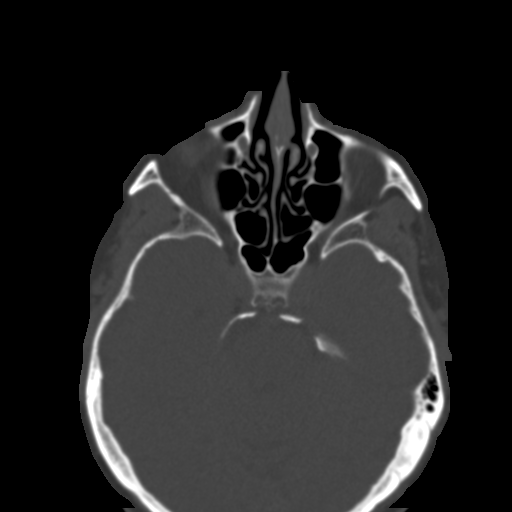
[im 25/49  bone]
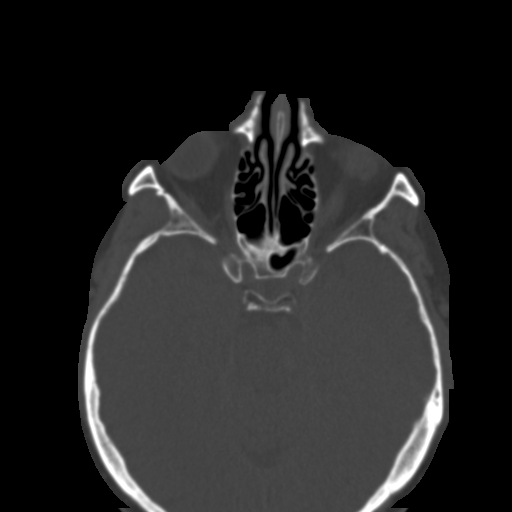
[im 27/49  brain]
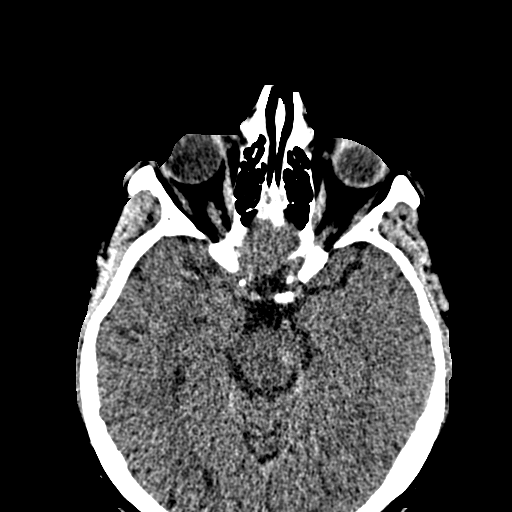
[im 27/49  bone]
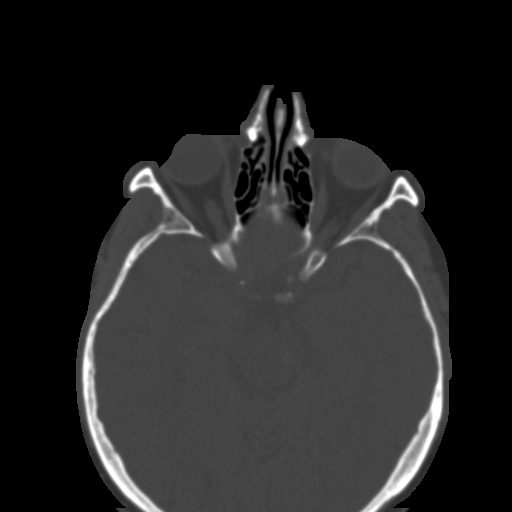
[im 30/49  bone]
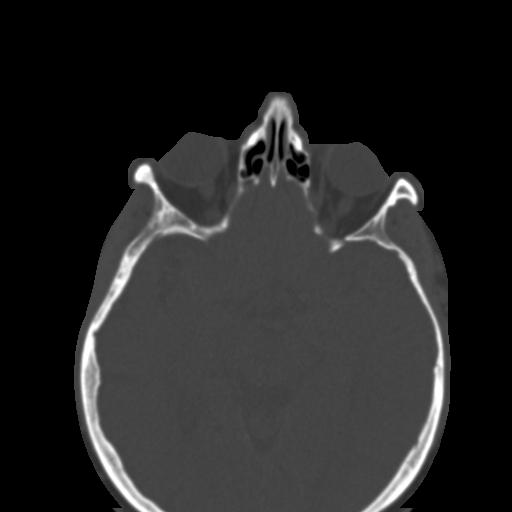
[im 34/49  bone]
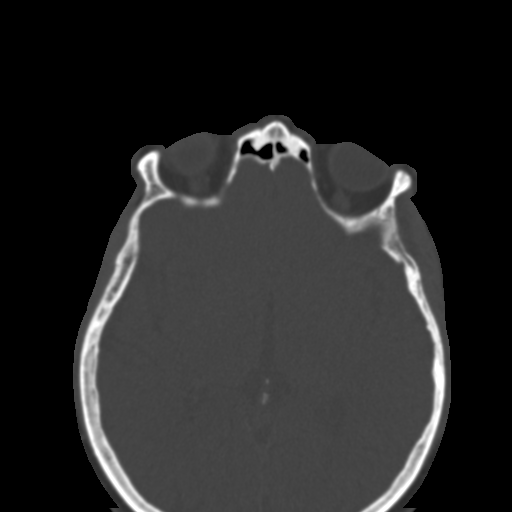
[im 37/49  bone]
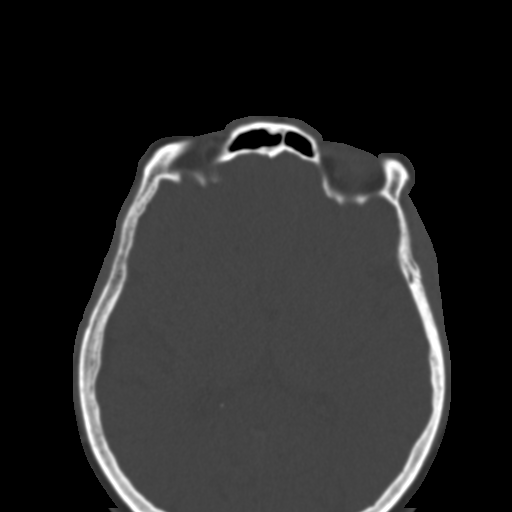
[im 40/49  brain]
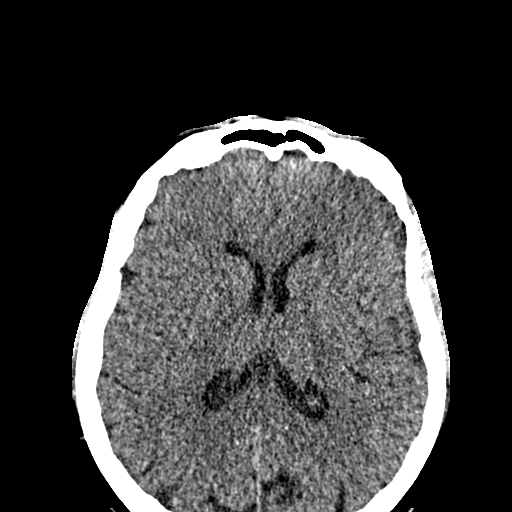
[im 40/49  bone]
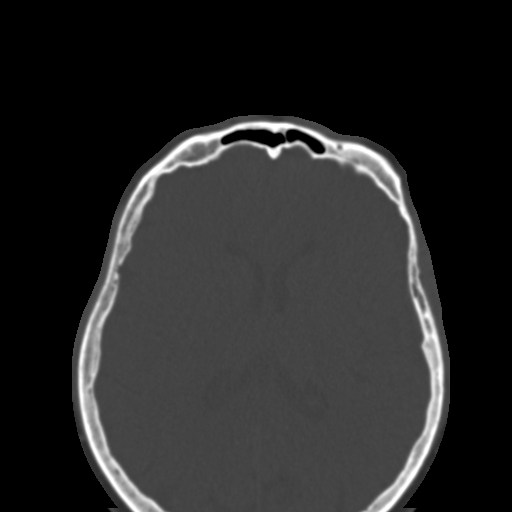
[im 44/49  bone]
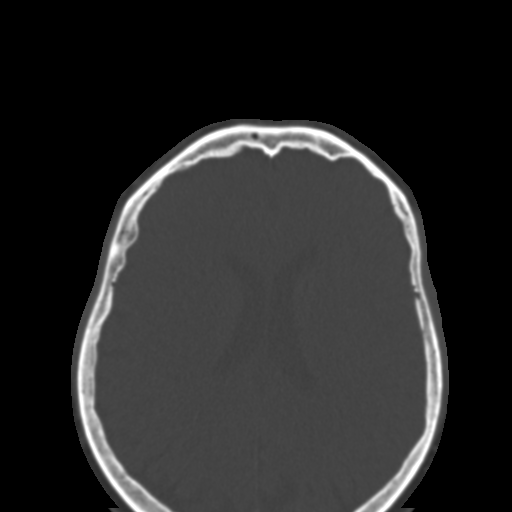
[im 47/49  bone]
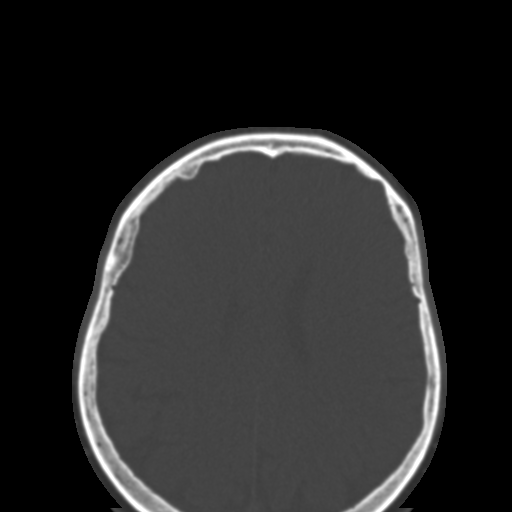

[15 of 30 positions shown; findings below may reference images not displayed]

FINDINGS: Osseous: No fracture or destructive process. Nasal septum is
midline. Nasal bones are intact. Included mandibles are normal.
Temporomandibular joints are congruent.

Orbits: Bilateral cataract resection. Otherwise negative. No orbital
inflammation

Sinuses: Mucous retention cyst in the right maxillary sinus with
mild right maxillary sinus mucosal thickening. The remaining
paranasal sinuses are clear. No other mucosal thickening. No sinus
fluid level. No evidence of focal lesion.

Soft tissues: Streak artifact from dental hardware partially
obscures regional evaluation. Otherwise negative.

Limited intracranial: No significant or unexpected finding.
IMPRESSION: Mucous retention cyst in the right maxillary sinus with mild right
maxillary sinus mucosal thickening. Remaining paranasal sinuses are
clear.

## 2020-06-16 ENCOUNTER — Other Ambulatory Visit: Payer: Self-pay | Admitting: Orthopedic Surgery

## 2020-06-16 DIAGNOSIS — M545 Low back pain, unspecified: Secondary | ICD-10-CM

## 2020-06-23 ENCOUNTER — Other Ambulatory Visit: Payer: Self-pay | Admitting: Orthopedic Surgery

## 2020-06-23 DIAGNOSIS — M546 Pain in thoracic spine: Secondary | ICD-10-CM

## 2020-07-15 ENCOUNTER — Other Ambulatory Visit: Payer: PPO

## 2020-07-18 ENCOUNTER — Other Ambulatory Visit: Payer: Self-pay

## 2020-07-18 ENCOUNTER — Ambulatory Visit
Admission: RE | Admit: 2020-07-18 | Discharge: 2020-07-18 | Disposition: A | Discharge status: Home / Self Care | Payer: PPO | Source: Ambulatory Visit | Attending: Orthopedic Surgery | Admitting: Orthopedic Surgery

## 2020-07-18 DIAGNOSIS — M47814 Spondylosis without myelopathy or radiculopathy, thoracic region: Secondary | ICD-10-CM | POA: Diagnosis not present

## 2020-07-18 DIAGNOSIS — M47816 Spondylosis without myelopathy or radiculopathy, lumbar region: Secondary | ICD-10-CM | POA: Diagnosis not present

## 2020-07-18 DIAGNOSIS — M48061 Spinal stenosis, lumbar region without neurogenic claudication: Secondary | ICD-10-CM | POA: Diagnosis not present

## 2020-07-18 DIAGNOSIS — M545 Low back pain, unspecified: Secondary | ICD-10-CM

## 2020-07-18 DIAGNOSIS — M4856XA Collapsed vertebra, not elsewhere classified, lumbar region, initial encounter for fracture: Secondary | ICD-10-CM | POA: Diagnosis not present

## 2020-07-18 DIAGNOSIS — M546 Pain in thoracic spine: Secondary | ICD-10-CM

## 2020-07-18 DIAGNOSIS — M5124 Other intervertebral disc displacement, thoracic region: Secondary | ICD-10-CM | POA: Diagnosis not present

## 2020-07-18 DIAGNOSIS — M5126 Other intervertebral disc displacement, lumbar region: Secondary | ICD-10-CM | POA: Diagnosis not present

## 2020-07-18 IMAGING — MR MR LUMBAR SPINE W/O CM
4 of 5 series · 24 of 48 positions shown · non-contrast
Comparison: None.

CLINICAL DATA: Thoracic spine pain. Low back pain radiating to both
legs.

EXAM:
MRI THORACIC AND LUMBAR SPINE WITHOUT CONTRAST
TECHNIQUE: Multiplanar and multiecho pulse sequences of the thoracic and lumbar
spine were obtained without intravenous contrast.

[Series 4: T2 · sagittal · 4.0mm · 0.53mm/px · 6 of 18 slices shown (1 of 2)]
[im 1/18]
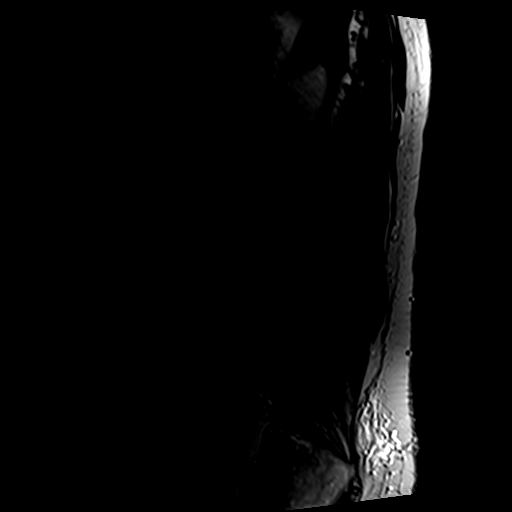
[im 4/18]
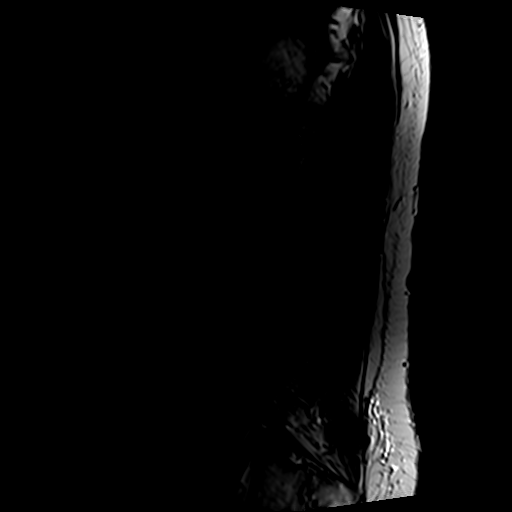
[im 7/18]
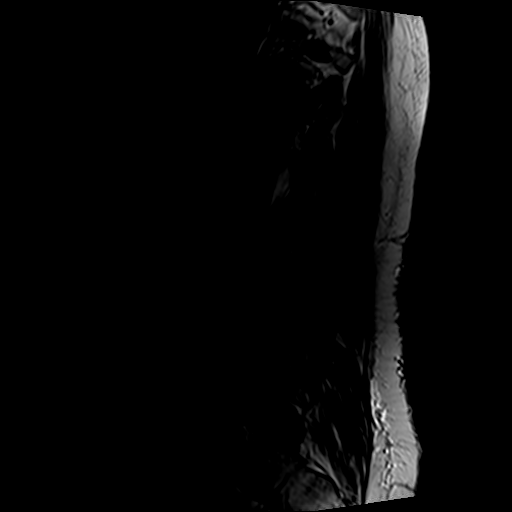
[im 11/18]
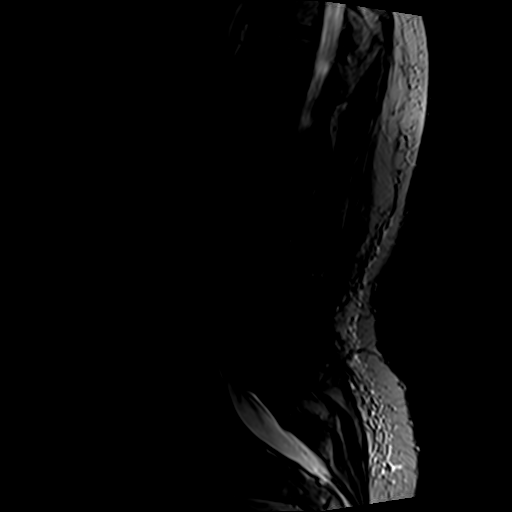
[im 14/18]
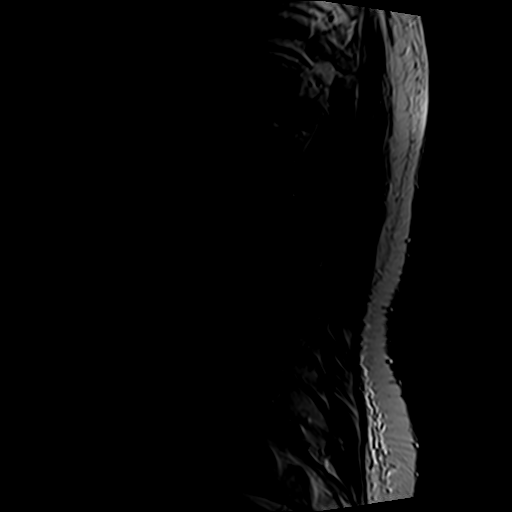
[im 18/18]
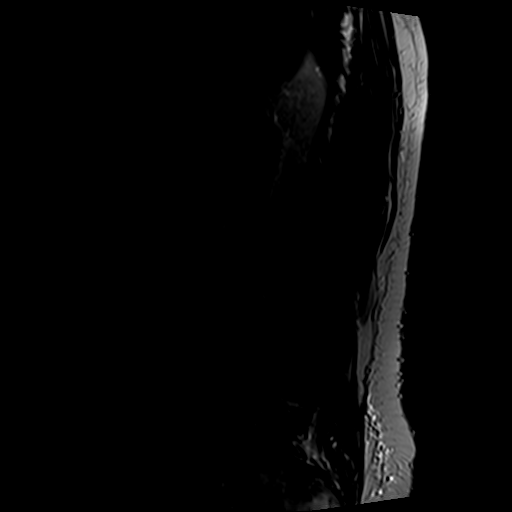

[Series 6: T1 · sagittal · 4.0mm · 0.53mm/px · 7 of 18 slices shown (1 of 2)]
[im 1/18]
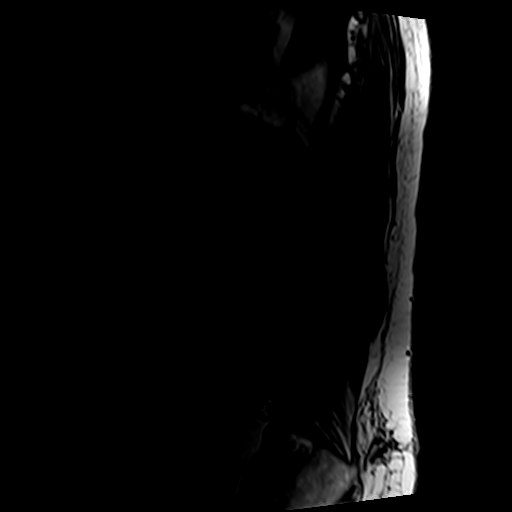
[im 3/18]
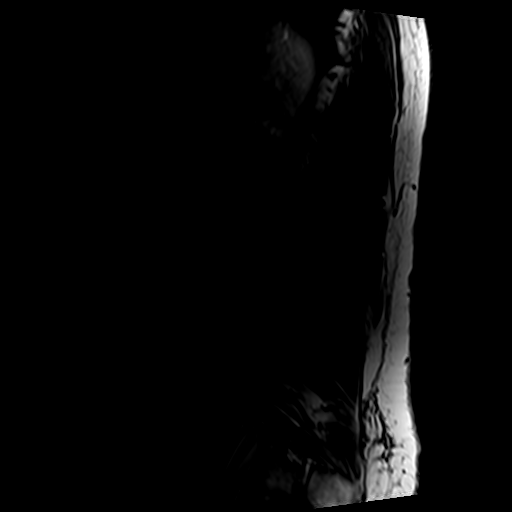
[im 6/18]
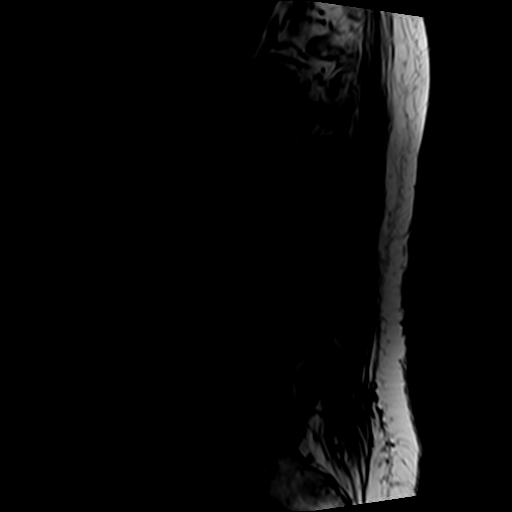
[im 9/18]
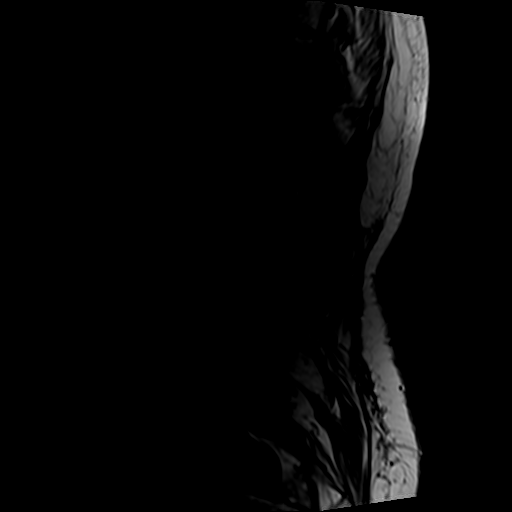
[im 12/18]
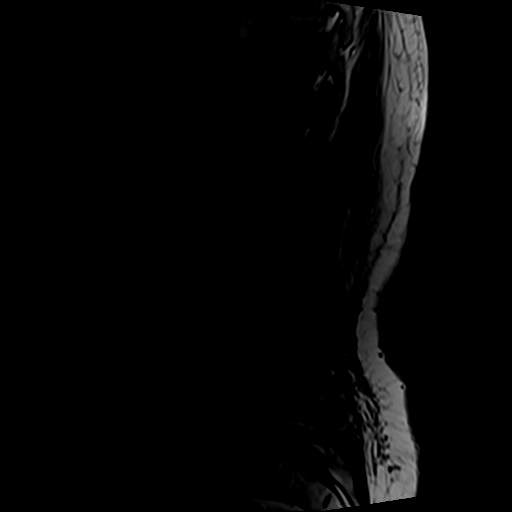
[im 15/18]
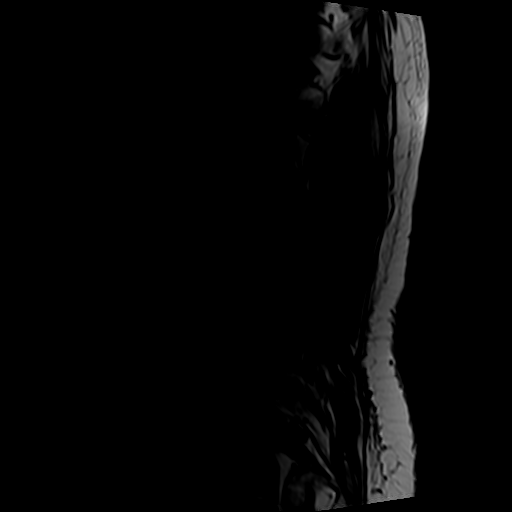
[im 18/18]
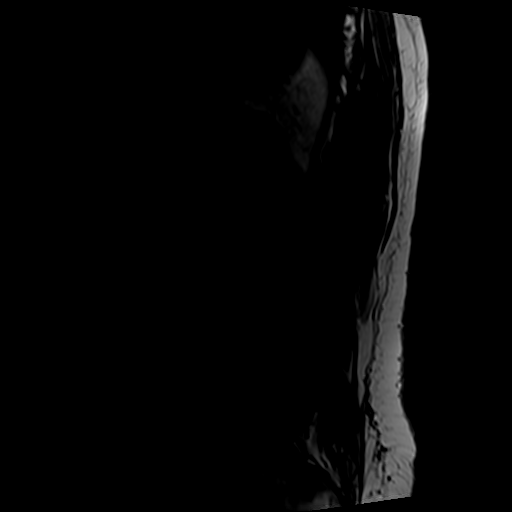

[Series 8: T1 · axial · 4.0mm · 0.35mm/px · z∈[-41,+106]mm · 3 of 39 slices shown (2 of 2)]
[im 6/39]
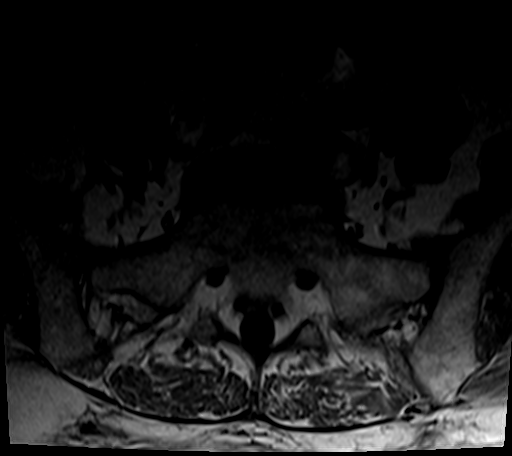
[im 21/39]
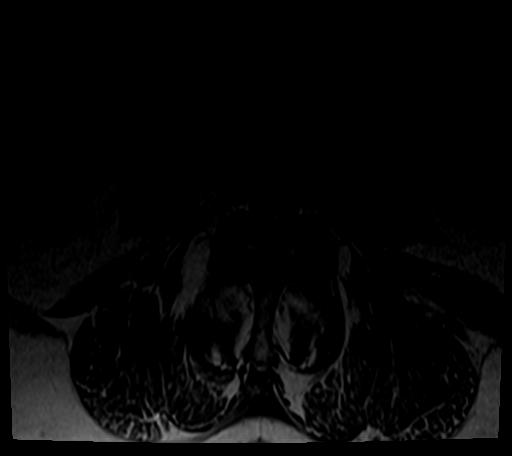
[im 33/39]
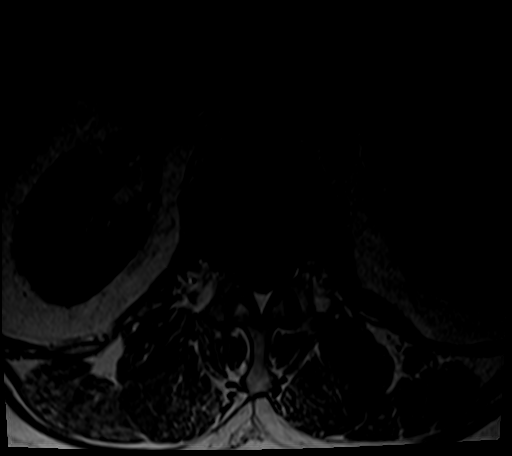

[Series 9: T2 · axial · 4.0mm · 0.70mm/px · z∈[-67,+137]mm · 8 of 39 slices shown (2 of 2)]
[im 1/39]
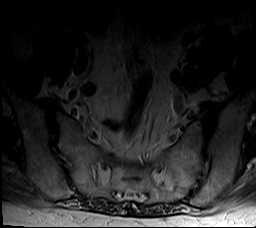
[im 6/39]
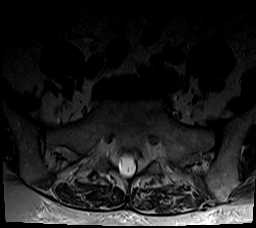
[im 12/39]
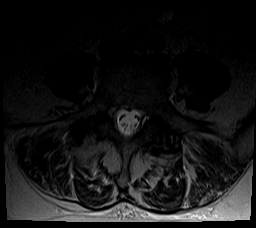
[im 18/39]
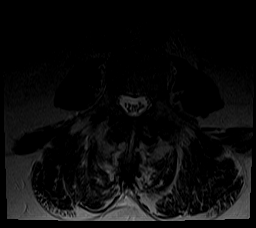
[im 21/39]
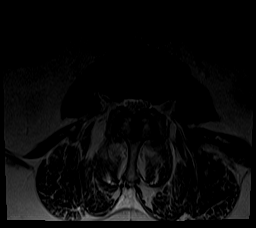
[im 27/39]
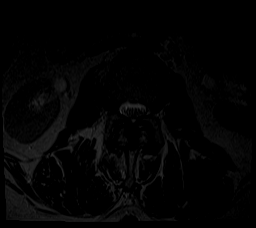
[im 33/39]
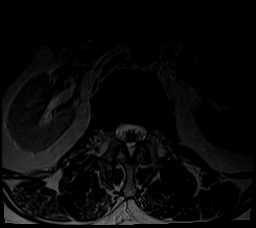
[im 39/39]
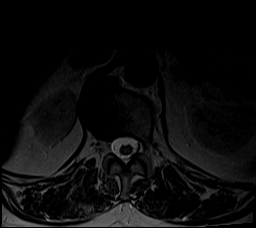

[24 of 48 positions shown; findings below may reference images not displayed]

FINDINGS: MRI THORACIC SPINE FINDINGS

Alignment:  Exaggerated thoracic kyphosis, likely postural.

Vertebrae: No fracture, evidence of discitis, or bone lesion.

Cord:  Normal signal and morphology.

Paraspinal and other soft tissues: No evidence of inflammation or
mass.

Disc levels:

Generalized spondylosis. Some osteophytes are likely bridging,
especially at T8-T12. Disc space narrowing mainly at T6-7 and T8-9.
Small disc protrusion at T8-9, T10-11, and T11-12. Negative facets.
No neural compression.

MRI LUMBAR SPINE FINDINGS

Segmentation:  5 lumbar type vertebrae

Alignment: Mild degenerative retrolisthesis at L2-3 and
anterolisthesis at L4-5.

Vertebrae: Remote L2 compression fracture with moderate height loss.

Conus medullaris and cauda equina: Conus extends to the L2 level.
Conus and cauda equina appear normal.

Paraspinal and other soft tissues: No evidence of inflammation or
mass.

Disc levels:

T12- L1: Spondylosis.  No impingement

L1-L2: Spondylosis with bridging osteophyte likely.  No impingement

L2-L3: Disc narrowing and bulging. Degenerative facet spurring and
ligamentum flavum thickening. High-grade spinal stenosis with
complete effacement of CSF. Mild-to-moderate bilateral foraminal
narrowing.

L3-L4: Bulky posterior element hypertrophy. Mild disc bulging.
Advanced spinal stenosis. Mild-to-moderate bilateral foraminal
narrowing.

L4-L5: Facet osteoarthritis with bulky hypertrophy, particularly
severe on the left. Mild anterolisthesis. Minor disc narrowing. No
neural compression

L5-S1:Facet osteoarthritis with moderate to bulky spurring. No
herniation or impingement.
IMPRESSION: MR THORACIC SPINE IMPRESSION

Spondylosis with bridging osteophytes in the lower thoracic spine.
There are small superimposed disc protrusions. No neural
compression, compression fracture, or inflammatory finding.

MR LUMBAR SPINE IMPRESSION

1. Multilevel degenerative disease, most notably facet
osteoarthritis with bulky spurring at L2-3 and below.
2. L2-3 and L3-4 advanced spinal stenosis.
3. Remote L2 compression fracture.

## 2020-07-18 IMAGING — MR MR THORACIC SPINE W/O CM
4 of 5 series · 20 of 48 positions shown · non-contrast
Comparison: None.

CLINICAL DATA: Thoracic spine pain. Low back pain radiating to both
legs.

EXAM:
MRI THORACIC AND LUMBAR SPINE WITHOUT CONTRAST
TECHNIQUE: Multiplanar and multiecho pulse sequences of the thoracic and lumbar
spine were obtained without intravenous contrast.

[Series 7: T1 · sagittal · 3.0mm · 0.55mm/px · 3 of 19 slices shown]
[im 4/19]
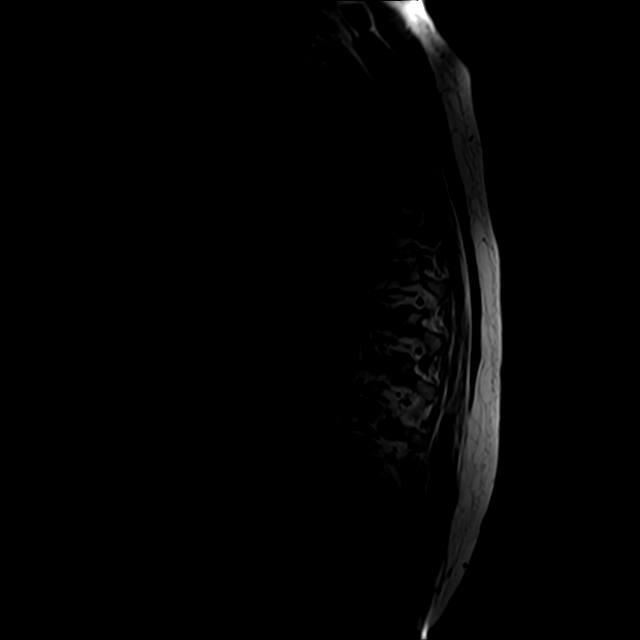
[im 11/19]
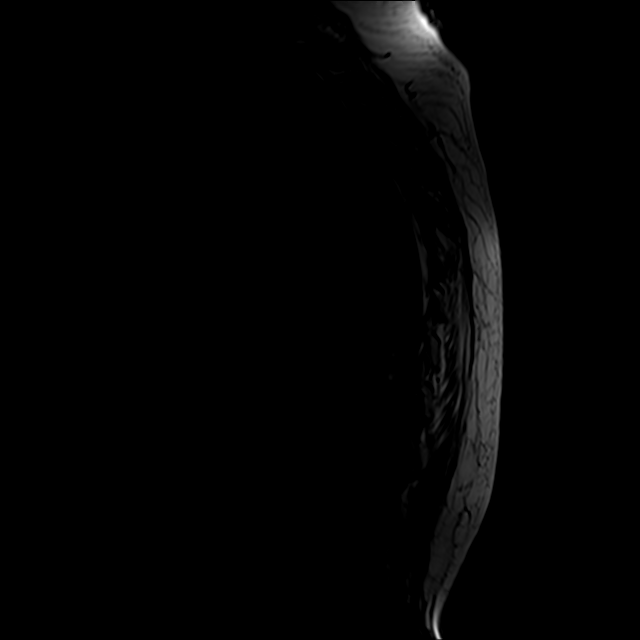
[im 19/19]
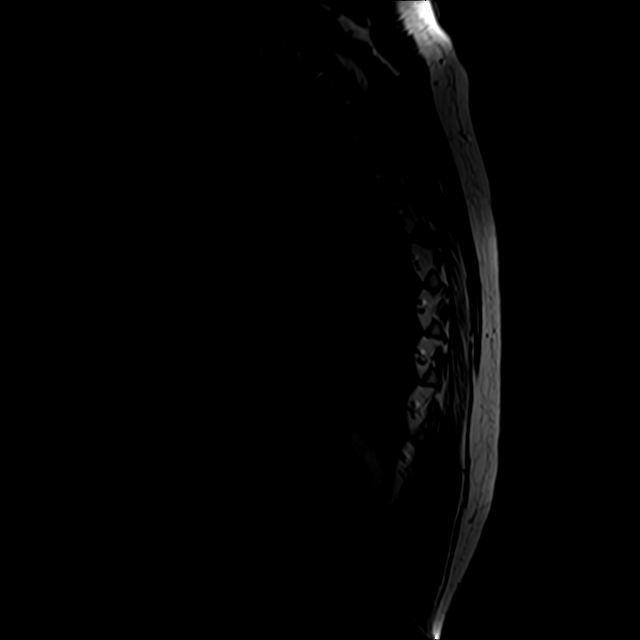

[Series 8: T2 · sagittal · 3.0mm · 0.68mm/px · 5 of 19 slices shown (1 of 2)]
[im 1/19]
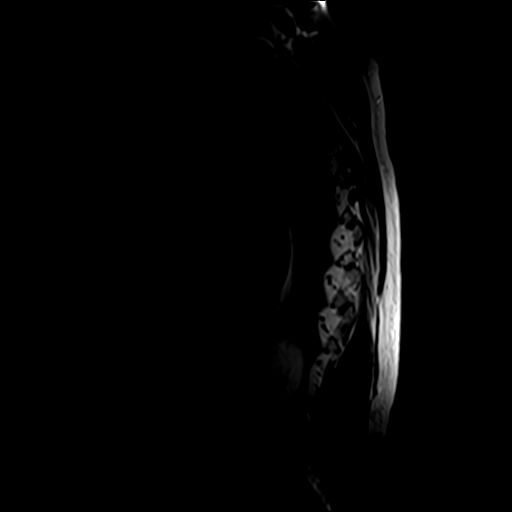
[im 5/19]
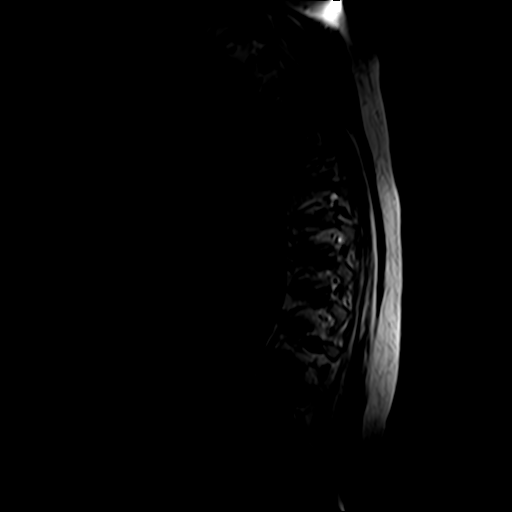
[im 10/19]
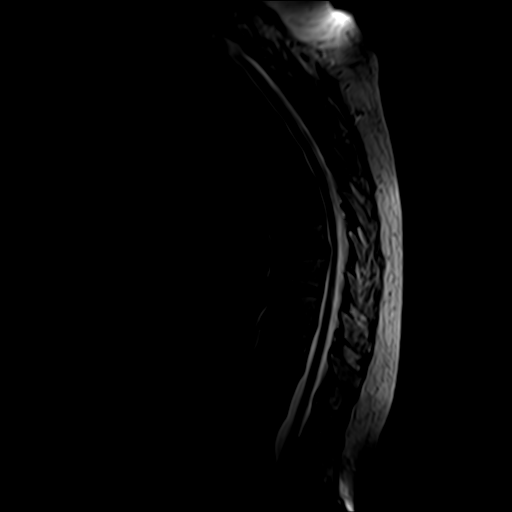
[im 14/19]
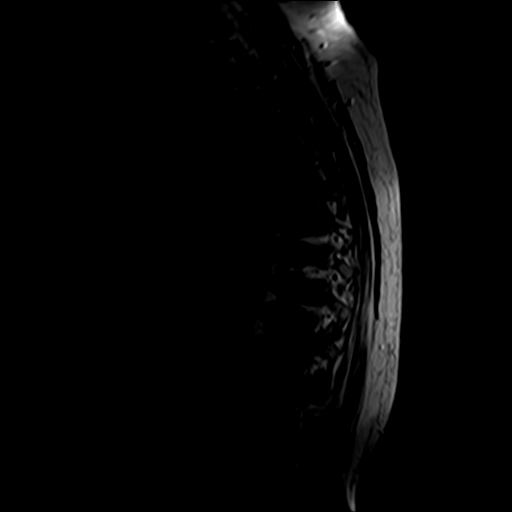
[im 19/19]
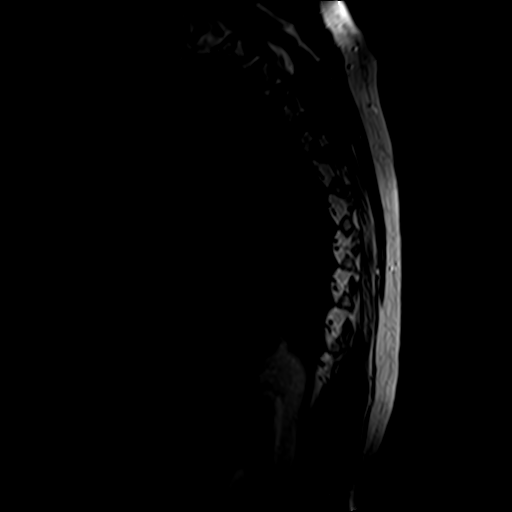

[Series 9: STIR · sagittal · 3.0mm · 1.37mm/px · 3 of 19 slices shown]
[im 1/19]
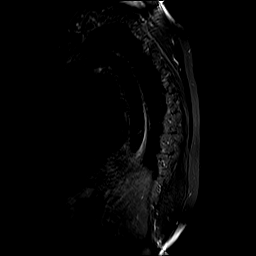
[im 10/19]
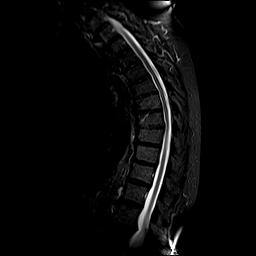
[im 19/19]
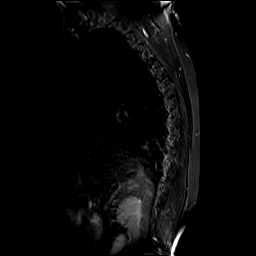

[Series 10: T2 · axial · 4.0mm · 0.41mm/px · z∈[-271,-18]mm · 9 of 59 slices shown (2 of 2)]
[im 4/59]
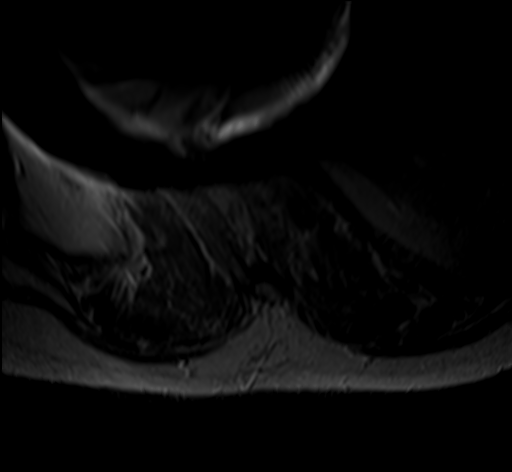
[im 8/59]
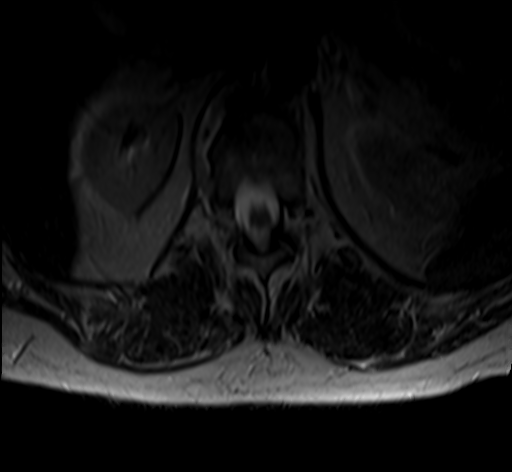
[im 12/59]
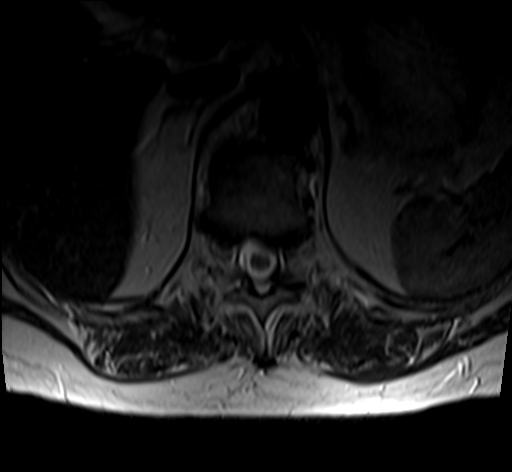
[im 20/59]
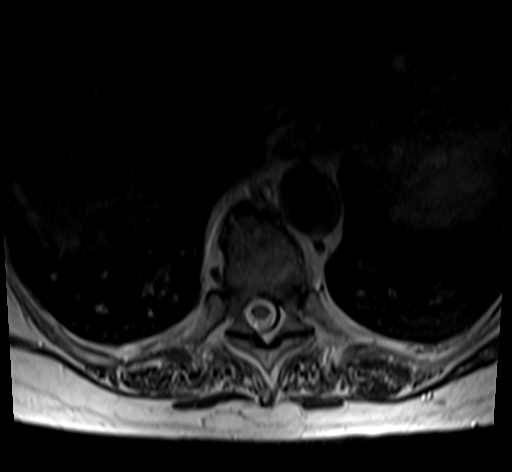
[im 28/59]
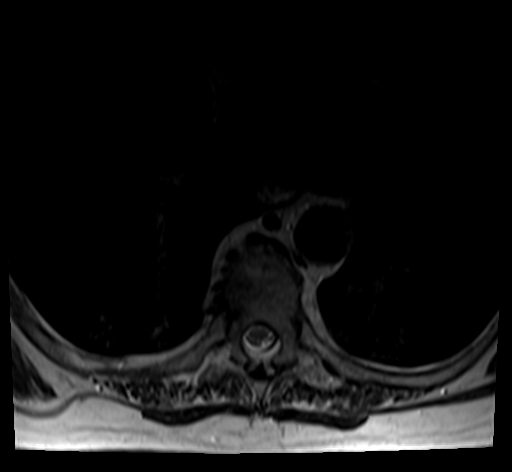
[im 31/59]
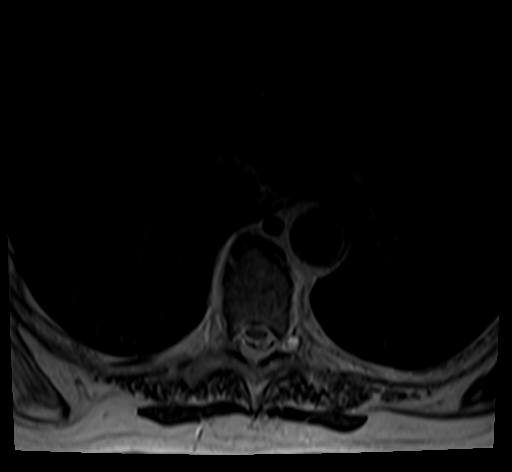
[im 35/59]
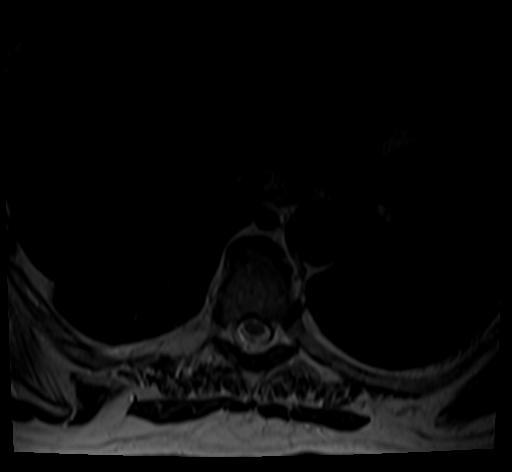
[im 43/59]
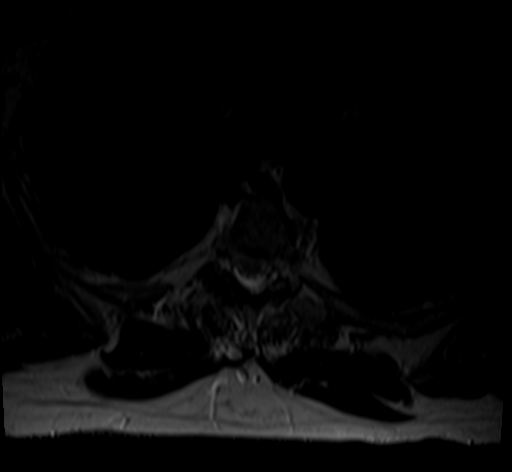
[im 51/59]
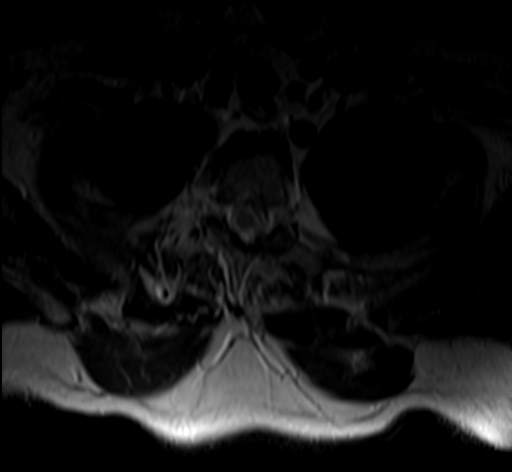

[20 of 48 positions shown; findings below may reference images not displayed]

FINDINGS: MRI THORACIC SPINE FINDINGS

Alignment:  Exaggerated thoracic kyphosis, likely postural.

Vertebrae: No fracture, evidence of discitis, or bone lesion.

Cord:  Normal signal and morphology.

Paraspinal and other soft tissues: No evidence of inflammation or
mass.

Disc levels:

Generalized spondylosis. Some osteophytes are likely bridging,
especially at T8-T12. Disc space narrowing mainly at T6-7 and T8-9.
Small disc protrusion at T8-9, T10-11, and T11-12. Negative facets.
No neural compression.

MRI LUMBAR SPINE FINDINGS

Segmentation:  5 lumbar type vertebrae

Alignment: Mild degenerative retrolisthesis at L2-3 and
anterolisthesis at L4-5.

Vertebrae: Remote L2 compression fracture with moderate height loss.

Conus medullaris and cauda equina: Conus extends to the L2 level.
Conus and cauda equina appear normal.

Paraspinal and other soft tissues: No evidence of inflammation or
mass.

Disc levels:

T12- L1: Spondylosis.  No impingement

L1-L2: Spondylosis with bridging osteophyte likely.  No impingement

L2-L3: Disc narrowing and bulging. Degenerative facet spurring and
ligamentum flavum thickening. High-grade spinal stenosis with
complete effacement of CSF. Mild-to-moderate bilateral foraminal
narrowing.

L3-L4: Bulky posterior element hypertrophy. Mild disc bulging.
Advanced spinal stenosis. Mild-to-moderate bilateral foraminal
narrowing.

L4-L5: Facet osteoarthritis with bulky hypertrophy, particularly
severe on the left. Mild anterolisthesis. Minor disc narrowing. No
neural compression

L5-S1:Facet osteoarthritis with moderate to bulky spurring. No
herniation or impingement.
IMPRESSION: MR THORACIC SPINE IMPRESSION

Spondylosis with bridging osteophytes in the lower thoracic spine.
There are small superimposed disc protrusions. No neural
compression, compression fracture, or inflammatory finding.

MR LUMBAR SPINE IMPRESSION

1. Multilevel degenerative disease, most notably facet
osteoarthritis with bulky spurring at L2-3 and below.
2. L2-3 and L3-4 advanced spinal stenosis.
3. Remote L2 compression fracture.

## 2020-07-22 DIAGNOSIS — E785 Hyperlipidemia, unspecified: Secondary | ICD-10-CM | POA: Diagnosis not present

## 2020-07-22 DIAGNOSIS — R109 Unspecified abdominal pain: Secondary | ICD-10-CM | POA: Diagnosis not present

## 2020-07-22 DIAGNOSIS — R0683 Snoring: Secondary | ICD-10-CM | POA: Diagnosis not present

## 2020-07-22 DIAGNOSIS — L989 Disorder of the skin and subcutaneous tissue, unspecified: Secondary | ICD-10-CM | POA: Diagnosis not present

## 2020-07-22 DIAGNOSIS — R911 Solitary pulmonary nodule: Secondary | ICD-10-CM | POA: Diagnosis not present

## 2020-07-22 DIAGNOSIS — M549 Dorsalgia, unspecified: Secondary | ICD-10-CM | POA: Diagnosis not present

## 2020-07-25 DIAGNOSIS — J341 Cyst and mucocele of nose and nasal sinus: Secondary | ICD-10-CM | POA: Diagnosis not present

## 2020-07-31 DIAGNOSIS — M545 Low back pain, unspecified: Secondary | ICD-10-CM | POA: Diagnosis not present

## 2020-07-31 DIAGNOSIS — M546 Pain in thoracic spine: Secondary | ICD-10-CM | POA: Diagnosis not present

## 2020-07-31 DIAGNOSIS — M48062 Spinal stenosis, lumbar region with neurogenic claudication: Secondary | ICD-10-CM | POA: Diagnosis not present

## 2020-09-24 DIAGNOSIS — M5136 Other intervertebral disc degeneration, lumbar region: Secondary | ICD-10-CM | POA: Diagnosis not present

## 2020-09-24 DIAGNOSIS — M5416 Radiculopathy, lumbar region: Secondary | ICD-10-CM | POA: Diagnosis not present

## 2020-09-24 DIAGNOSIS — M47816 Spondylosis without myelopathy or radiculopathy, lumbar region: Secondary | ICD-10-CM | POA: Diagnosis not present

## 2020-09-24 DIAGNOSIS — M48062 Spinal stenosis, lumbar region with neurogenic claudication: Secondary | ICD-10-CM | POA: Diagnosis not present

## 2020-09-24 DIAGNOSIS — M5442 Lumbago with sciatica, left side: Secondary | ICD-10-CM | POA: Diagnosis not present

## 2020-09-24 DIAGNOSIS — M546 Pain in thoracic spine: Secondary | ICD-10-CM | POA: Diagnosis not present

## 2020-09-30 DIAGNOSIS — R262 Difficulty in walking, not elsewhere classified: Secondary | ICD-10-CM | POA: Diagnosis not present

## 2020-09-30 DIAGNOSIS — M6281 Muscle weakness (generalized): Secondary | ICD-10-CM | POA: Diagnosis not present

## 2020-09-30 DIAGNOSIS — M2569 Stiffness of other specified joint, not elsewhere classified: Secondary | ICD-10-CM | POA: Diagnosis not present

## 2020-09-30 DIAGNOSIS — M545 Low back pain, unspecified: Secondary | ICD-10-CM | POA: Diagnosis not present

## 2020-10-06 DIAGNOSIS — M2569 Stiffness of other specified joint, not elsewhere classified: Secondary | ICD-10-CM | POA: Diagnosis not present

## 2020-10-06 DIAGNOSIS — R262 Difficulty in walking, not elsewhere classified: Secondary | ICD-10-CM | POA: Diagnosis not present

## 2020-10-06 DIAGNOSIS — M6281 Muscle weakness (generalized): Secondary | ICD-10-CM | POA: Diagnosis not present

## 2020-10-06 DIAGNOSIS — M545 Low back pain, unspecified: Secondary | ICD-10-CM | POA: Diagnosis not present

## 2020-10-09 DIAGNOSIS — M6281 Muscle weakness (generalized): Secondary | ICD-10-CM | POA: Diagnosis not present

## 2020-10-09 DIAGNOSIS — M545 Low back pain, unspecified: Secondary | ICD-10-CM | POA: Diagnosis not present

## 2020-10-09 DIAGNOSIS — R262 Difficulty in walking, not elsewhere classified: Secondary | ICD-10-CM | POA: Diagnosis not present

## 2020-10-09 DIAGNOSIS — M2569 Stiffness of other specified joint, not elsewhere classified: Secondary | ICD-10-CM | POA: Diagnosis not present

## 2020-10-13 DIAGNOSIS — M2569 Stiffness of other specified joint, not elsewhere classified: Secondary | ICD-10-CM | POA: Diagnosis not present

## 2020-10-13 DIAGNOSIS — M6281 Muscle weakness (generalized): Secondary | ICD-10-CM | POA: Diagnosis not present

## 2020-10-13 DIAGNOSIS — R262 Difficulty in walking, not elsewhere classified: Secondary | ICD-10-CM | POA: Diagnosis not present

## 2020-10-13 DIAGNOSIS — M545 Low back pain, unspecified: Secondary | ICD-10-CM | POA: Diagnosis not present

## 2020-10-21 DIAGNOSIS — M6281 Muscle weakness (generalized): Secondary | ICD-10-CM | POA: Diagnosis not present

## 2020-10-21 DIAGNOSIS — M545 Low back pain, unspecified: Secondary | ICD-10-CM | POA: Diagnosis not present

## 2020-10-21 DIAGNOSIS — M2569 Stiffness of other specified joint, not elsewhere classified: Secondary | ICD-10-CM | POA: Diagnosis not present

## 2020-10-21 DIAGNOSIS — R262 Difficulty in walking, not elsewhere classified: Secondary | ICD-10-CM | POA: Diagnosis not present

## 2020-10-30 DIAGNOSIS — M791 Myalgia, unspecified site: Secondary | ICD-10-CM | POA: Diagnosis not present

## 2020-10-30 DIAGNOSIS — M9901 Segmental and somatic dysfunction of cervical region: Secondary | ICD-10-CM | POA: Diagnosis not present

## 2020-10-30 DIAGNOSIS — M9902 Segmental and somatic dysfunction of thoracic region: Secondary | ICD-10-CM | POA: Diagnosis not present

## 2020-11-05 DIAGNOSIS — M48062 Spinal stenosis, lumbar region with neurogenic claudication: Secondary | ICD-10-CM | POA: Diagnosis not present

## 2020-11-05 DIAGNOSIS — M546 Pain in thoracic spine: Secondary | ICD-10-CM | POA: Diagnosis not present

## 2020-11-05 DIAGNOSIS — R030 Elevated blood-pressure reading, without diagnosis of hypertension: Secondary | ICD-10-CM | POA: Diagnosis not present

## 2020-11-05 DIAGNOSIS — M5442 Lumbago with sciatica, left side: Secondary | ICD-10-CM | POA: Diagnosis not present

## 2020-11-20 DIAGNOSIS — M48062 Spinal stenosis, lumbar region with neurogenic claudication: Secondary | ICD-10-CM | POA: Diagnosis not present

## 2020-11-27 DIAGNOSIS — M791 Myalgia, unspecified site: Secondary | ICD-10-CM | POA: Diagnosis not present

## 2020-11-27 DIAGNOSIS — M9902 Segmental and somatic dysfunction of thoracic region: Secondary | ICD-10-CM | POA: Diagnosis not present

## 2020-11-27 DIAGNOSIS — M9901 Segmental and somatic dysfunction of cervical region: Secondary | ICD-10-CM | POA: Diagnosis not present

## 2020-12-05 DIAGNOSIS — M791 Myalgia, unspecified site: Secondary | ICD-10-CM | POA: Diagnosis not present

## 2020-12-05 DIAGNOSIS — M9901 Segmental and somatic dysfunction of cervical region: Secondary | ICD-10-CM | POA: Diagnosis not present

## 2020-12-05 DIAGNOSIS — M9902 Segmental and somatic dysfunction of thoracic region: Secondary | ICD-10-CM | POA: Diagnosis not present

## 2020-12-19 DIAGNOSIS — Z6830 Body mass index (BMI) 30.0-30.9, adult: Secondary | ICD-10-CM | POA: Diagnosis not present

## 2020-12-19 DIAGNOSIS — R030 Elevated blood-pressure reading, without diagnosis of hypertension: Secondary | ICD-10-CM | POA: Diagnosis not present

## 2020-12-19 DIAGNOSIS — M48062 Spinal stenosis, lumbar region with neurogenic claudication: Secondary | ICD-10-CM | POA: Diagnosis not present

## 2021-01-28 DIAGNOSIS — M5136 Other intervertebral disc degeneration, lumbar region: Secondary | ICD-10-CM | POA: Diagnosis not present

## 2021-01-28 DIAGNOSIS — M5441 Lumbago with sciatica, right side: Secondary | ICD-10-CM | POA: Diagnosis not present

## 2021-01-28 DIAGNOSIS — M47816 Spondylosis without myelopathy or radiculopathy, lumbar region: Secondary | ICD-10-CM | POA: Diagnosis not present

## 2021-01-28 DIAGNOSIS — M5416 Radiculopathy, lumbar region: Secondary | ICD-10-CM | POA: Diagnosis not present

## 2021-04-08 DIAGNOSIS — M4126 Other idiopathic scoliosis, lumbar region: Secondary | ICD-10-CM | POA: Diagnosis not present

## 2021-04-08 DIAGNOSIS — M5416 Radiculopathy, lumbar region: Secondary | ICD-10-CM | POA: Diagnosis not present

## 2021-04-08 DIAGNOSIS — M47816 Spondylosis without myelopathy or radiculopathy, lumbar region: Secondary | ICD-10-CM | POA: Diagnosis not present

## 2021-04-08 DIAGNOSIS — M5442 Lumbago with sciatica, left side: Secondary | ICD-10-CM | POA: Diagnosis not present

## 2021-04-23 DIAGNOSIS — N3941 Urge incontinence: Secondary | ICD-10-CM | POA: Diagnosis not present

## 2021-04-23 DIAGNOSIS — R1032 Left lower quadrant pain: Secondary | ICD-10-CM | POA: Diagnosis not present

## 2021-04-23 DIAGNOSIS — R351 Nocturia: Secondary | ICD-10-CM | POA: Diagnosis not present

## 2021-04-23 DIAGNOSIS — N816 Rectocele: Secondary | ICD-10-CM | POA: Diagnosis not present

## 2021-05-06 DIAGNOSIS — L989 Disorder of the skin and subcutaneous tissue, unspecified: Secondary | ICD-10-CM | POA: Diagnosis not present

## 2021-05-06 DIAGNOSIS — M545 Low back pain, unspecified: Secondary | ICD-10-CM | POA: Diagnosis not present

## 2021-05-06 DIAGNOSIS — K5900 Constipation, unspecified: Secondary | ICD-10-CM | POA: Diagnosis not present

## 2021-05-06 DIAGNOSIS — R1032 Left lower quadrant pain: Secondary | ICD-10-CM | POA: Diagnosis not present

## 2021-05-06 DIAGNOSIS — N816 Rectocele: Secondary | ICD-10-CM | POA: Diagnosis not present

## 2021-05-06 DIAGNOSIS — G8929 Other chronic pain: Secondary | ICD-10-CM | POA: Diagnosis not present

## 2021-05-06 DIAGNOSIS — R911 Solitary pulmonary nodule: Secondary | ICD-10-CM | POA: Diagnosis not present

## 2021-05-13 DIAGNOSIS — R911 Solitary pulmonary nodule: Secondary | ICD-10-CM | POA: Diagnosis not present

## 2021-06-03 DIAGNOSIS — N816 Rectocele: Secondary | ICD-10-CM | POA: Diagnosis not present

## 2021-06-03 DIAGNOSIS — R82998 Other abnormal findings in urine: Secondary | ICD-10-CM | POA: Diagnosis not present

## 2021-06-03 DIAGNOSIS — R8290 Unspecified abnormal findings in urine: Secondary | ICD-10-CM | POA: Diagnosis not present

## 2021-06-03 DIAGNOSIS — R1032 Left lower quadrant pain: Secondary | ICD-10-CM | POA: Diagnosis not present

## 2021-06-03 DIAGNOSIS — R3129 Other microscopic hematuria: Secondary | ICD-10-CM | POA: Diagnosis not present

## 2021-06-03 DIAGNOSIS — N8181 Perineocele: Secondary | ICD-10-CM | POA: Diagnosis not present

## 2021-06-03 DIAGNOSIS — K5902 Outlet dysfunction constipation: Secondary | ICD-10-CM | POA: Diagnosis not present

## 2021-06-03 DIAGNOSIS — N8189 Other female genital prolapse: Secondary | ICD-10-CM | POA: Diagnosis not present

## 2021-06-15 DIAGNOSIS — N816 Rectocele: Secondary | ICD-10-CM | POA: Diagnosis not present

## 2021-06-15 DIAGNOSIS — K5900 Constipation, unspecified: Secondary | ICD-10-CM | POA: Diagnosis not present

## 2021-06-15 DIAGNOSIS — M9924 Subluxation stenosis of neural canal of sacral region: Secondary | ICD-10-CM | POA: Diagnosis not present

## 2021-06-15 DIAGNOSIS — Z1211 Encounter for screening for malignant neoplasm of colon: Secondary | ICD-10-CM | POA: Diagnosis not present

## 2021-07-02 DIAGNOSIS — N816 Rectocele: Secondary | ICD-10-CM | POA: Diagnosis not present

## 2021-07-07 DIAGNOSIS — Z1211 Encounter for screening for malignant neoplasm of colon: Secondary | ICD-10-CM | POA: Diagnosis not present

## 2021-07-07 DIAGNOSIS — K6389 Other specified diseases of intestine: Secondary | ICD-10-CM | POA: Diagnosis not present

## 2021-07-07 DIAGNOSIS — K5730 Diverticulosis of large intestine without perforation or abscess without bleeding: Secondary | ICD-10-CM | POA: Diagnosis not present

## 2021-07-09 DIAGNOSIS — E785 Hyperlipidemia, unspecified: Secondary | ICD-10-CM | POA: Diagnosis not present

## 2021-07-09 DIAGNOSIS — M545 Low back pain, unspecified: Secondary | ICD-10-CM | POA: Diagnosis not present

## 2021-07-09 DIAGNOSIS — R109 Unspecified abdominal pain: Secondary | ICD-10-CM | POA: Diagnosis not present

## 2021-07-09 DIAGNOSIS — R911 Solitary pulmonary nodule: Secondary | ICD-10-CM | POA: Diagnosis not present

## 2021-07-09 DIAGNOSIS — N1831 Chronic kidney disease, stage 3a: Secondary | ICD-10-CM | POA: Diagnosis not present

## 2021-07-09 DIAGNOSIS — G8929 Other chronic pain: Secondary | ICD-10-CM | POA: Diagnosis not present

## 2021-07-09 DIAGNOSIS — K5900 Constipation, unspecified: Secondary | ICD-10-CM | POA: Diagnosis not present

## 2021-07-09 DIAGNOSIS — N816 Rectocele: Secondary | ICD-10-CM | POA: Diagnosis not present

## 2021-07-09 DIAGNOSIS — L989 Disorder of the skin and subcutaneous tissue, unspecified: Secondary | ICD-10-CM | POA: Diagnosis not present

## 2021-07-09 DIAGNOSIS — I129 Hypertensive chronic kidney disease with stage 1 through stage 4 chronic kidney disease, or unspecified chronic kidney disease: Secondary | ICD-10-CM | POA: Diagnosis not present

## 2021-07-09 DIAGNOSIS — E2839 Other primary ovarian failure: Secondary | ICD-10-CM | POA: Diagnosis not present

## 2021-07-09 DIAGNOSIS — Z0000 Encounter for general adult medical examination without abnormal findings: Secondary | ICD-10-CM | POA: Diagnosis not present

## 2021-07-10 DIAGNOSIS — R278 Other lack of coordination: Secondary | ICD-10-CM | POA: Diagnosis not present

## 2021-07-10 DIAGNOSIS — M6281 Muscle weakness (generalized): Secondary | ICD-10-CM | POA: Diagnosis not present

## 2021-07-17 DIAGNOSIS — R278 Other lack of coordination: Secondary | ICD-10-CM | POA: Diagnosis not present

## 2021-07-17 DIAGNOSIS — M6281 Muscle weakness (generalized): Secondary | ICD-10-CM | POA: Diagnosis not present

## 2021-08-05 DIAGNOSIS — E785 Hyperlipidemia, unspecified: Secondary | ICD-10-CM | POA: Diagnosis not present

## 2021-08-06 DIAGNOSIS — Z1231 Encounter for screening mammogram for malignant neoplasm of breast: Secondary | ICD-10-CM | POA: Diagnosis not present

## 2021-08-06 DIAGNOSIS — Z780 Asymptomatic menopausal state: Secondary | ICD-10-CM | POA: Diagnosis not present

## 2021-08-06 DIAGNOSIS — E2839 Other primary ovarian failure: Secondary | ICD-10-CM | POA: Diagnosis not present

## 2021-08-13 DIAGNOSIS — K5902 Outlet dysfunction constipation: Secondary | ICD-10-CM | POA: Diagnosis not present

## 2021-08-13 DIAGNOSIS — R1032 Left lower quadrant pain: Secondary | ICD-10-CM | POA: Diagnosis not present

## 2021-08-13 DIAGNOSIS — Z01818 Encounter for other preprocedural examination: Secondary | ICD-10-CM | POA: Diagnosis not present

## 2021-08-19 ENCOUNTER — Other Ambulatory Visit: Payer: Self-pay | Admitting: Neurosurgery

## 2021-08-19 DIAGNOSIS — M48062 Spinal stenosis, lumbar region with neurogenic claudication: Secondary | ICD-10-CM

## 2021-08-21 DIAGNOSIS — N815 Vaginal enterocele: Secondary | ICD-10-CM | POA: Diagnosis not present

## 2021-08-21 DIAGNOSIS — N816 Rectocele: Secondary | ICD-10-CM | POA: Diagnosis not present

## 2021-08-21 DIAGNOSIS — K5902 Outlet dysfunction constipation: Secondary | ICD-10-CM | POA: Diagnosis not present

## 2021-08-21 HISTORY — PX: ENTEROCELE REPAIR: SHX623

## 2021-08-24 DIAGNOSIS — R339 Retention of urine, unspecified: Secondary | ICD-10-CM | POA: Diagnosis not present

## 2021-08-24 DIAGNOSIS — N3000 Acute cystitis without hematuria: Secondary | ICD-10-CM | POA: Diagnosis not present

## 2021-08-26 DIAGNOSIS — R399 Unspecified symptoms and signs involving the genitourinary system: Secondary | ICD-10-CM | POA: Diagnosis not present

## 2021-08-27 DIAGNOSIS — B9689 Other specified bacterial agents as the cause of diseases classified elsewhere: Secondary | ICD-10-CM | POA: Diagnosis not present

## 2021-08-27 DIAGNOSIS — N390 Urinary tract infection, site not specified: Secondary | ICD-10-CM | POA: Diagnosis not present

## 2021-08-27 DIAGNOSIS — Z9889 Other specified postprocedural states: Secondary | ICD-10-CM | POA: Diagnosis not present

## 2021-09-02 DIAGNOSIS — R338 Other retention of urine: Secondary | ICD-10-CM | POA: Diagnosis not present

## 2021-09-03 ENCOUNTER — Other Ambulatory Visit: Payer: PPO

## 2021-10-02 ENCOUNTER — Ambulatory Visit
Admission: RE | Admit: 2021-10-02 | Discharge: 2021-10-02 | Disposition: A | Discharge status: Home / Self Care | Payer: PPO | Source: Ambulatory Visit | Attending: Neurosurgery | Admitting: Neurosurgery

## 2021-10-02 DIAGNOSIS — M4316 Spondylolisthesis, lumbar region: Secondary | ICD-10-CM | POA: Diagnosis not present

## 2021-10-02 DIAGNOSIS — M2578 Osteophyte, vertebrae: Secondary | ICD-10-CM | POA: Diagnosis not present

## 2021-10-02 DIAGNOSIS — M4126 Other idiopathic scoliosis, lumbar region: Secondary | ICD-10-CM | POA: Diagnosis not present

## 2021-10-02 DIAGNOSIS — M545 Low back pain, unspecified: Secondary | ICD-10-CM | POA: Diagnosis not present

## 2021-10-02 DIAGNOSIS — M48061 Spinal stenosis, lumbar region without neurogenic claudication: Secondary | ICD-10-CM | POA: Diagnosis not present

## 2021-10-02 DIAGNOSIS — M48062 Spinal stenosis, lumbar region with neurogenic claudication: Secondary | ICD-10-CM

## 2021-10-02 IMAGING — MR MR LUMBAR SPINE W/O CM
4 of 5 series · 23 of 48 positions shown · non-contrast
Comparison: Lumbar MRI [DATE].  Lumbar radiographs [DATE].

CLINICAL DATA: 75-year-old female with low back pain radiating to
the buttocks and down both legs. Left greater than right lower
extremity weakness.

EXAM:
MRI LUMBAR SPINE WITHOUT CONTRAST
TECHNIQUE: Multiplanar, multisequence MR imaging of the lumbar spine was
performed. No intravenous contrast was administered.

[Series 3: T2 · sagittal · 4.0mm · 0.53mm/px · 7 of 19 slices shown (1 of 2)]
[im 1/19]
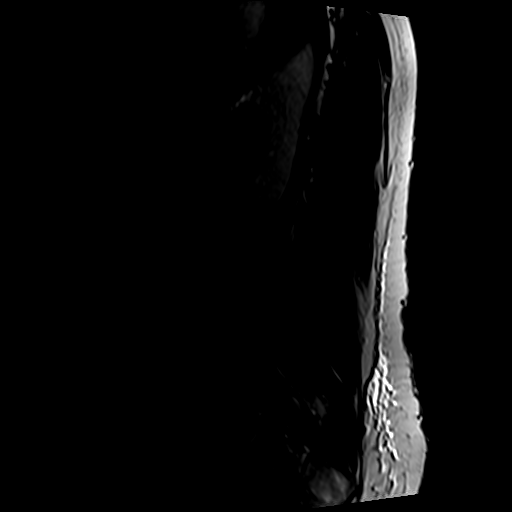
[im 4/19]
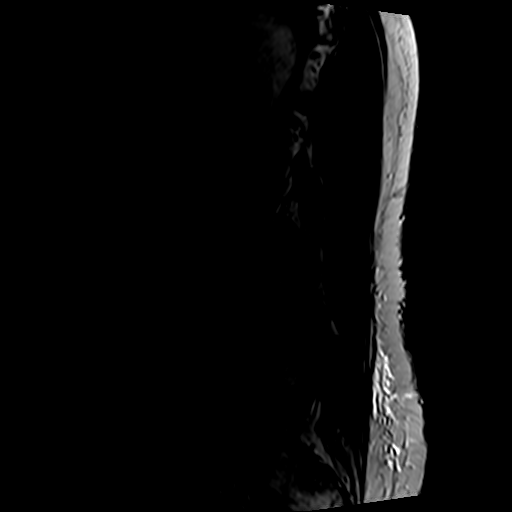
[im 7/19]
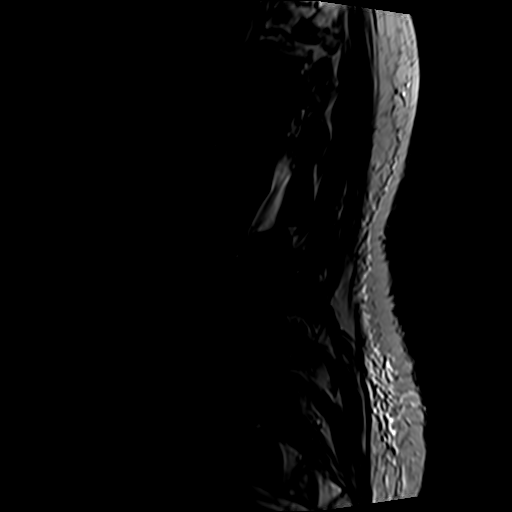
[im 10/19]
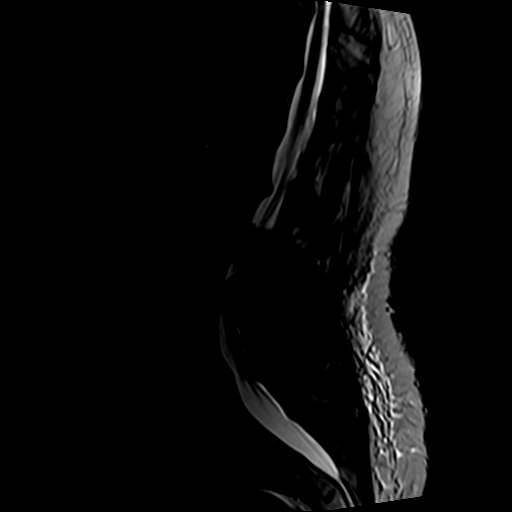
[im 13/19]
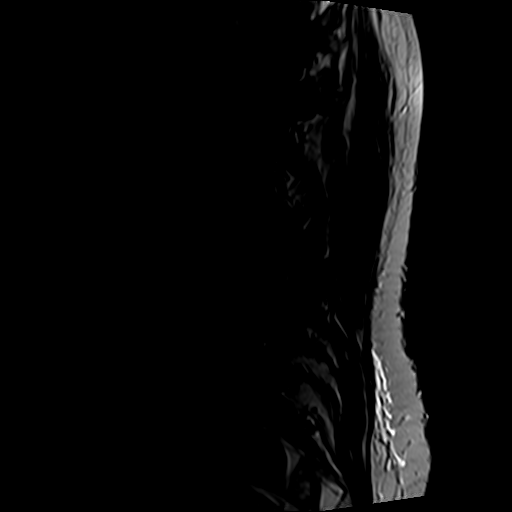
[im 16/19]
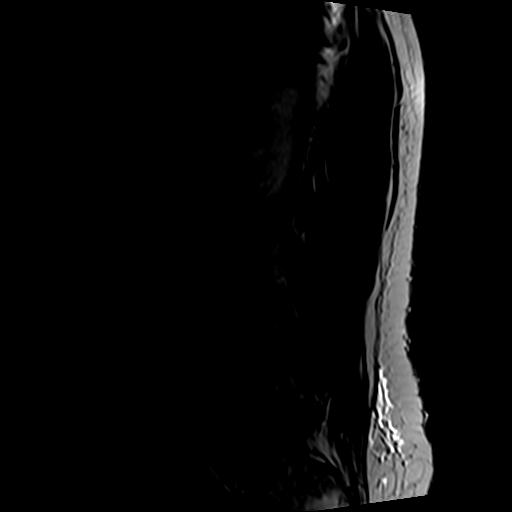
[im 19/19]
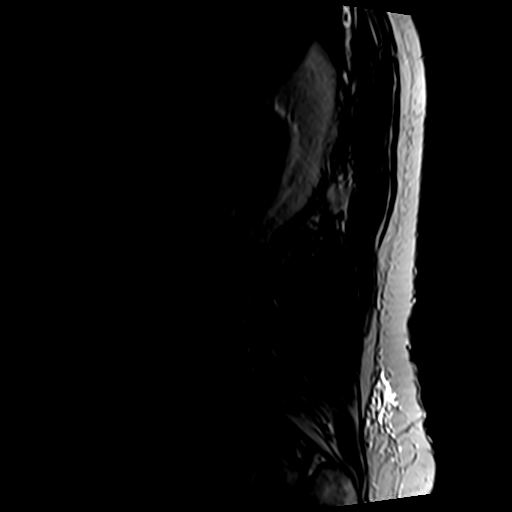

[Series 5: T1 · sagittal · 4.0mm · 0.53mm/px · 5 of 19 slices shown (1 of 2)]
[im 1/19]
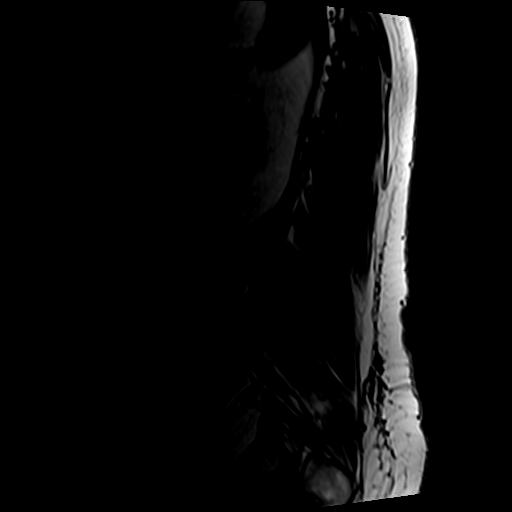
[im 4/19]
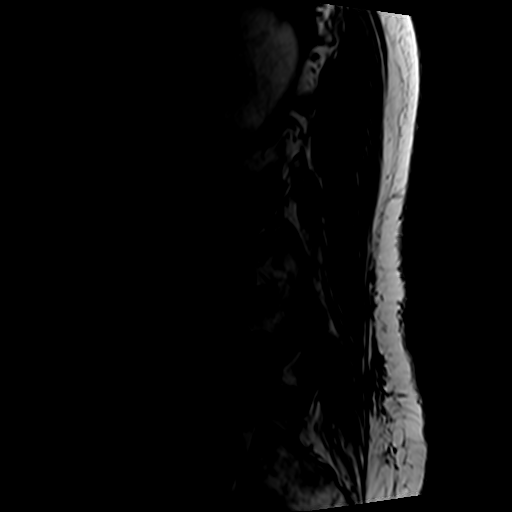
[im 8/19]
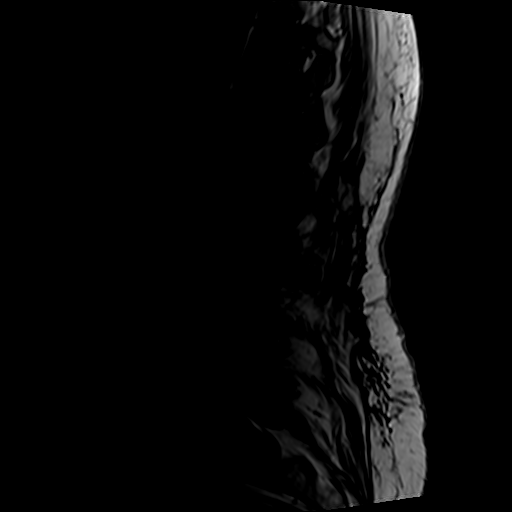
[im 11/19]
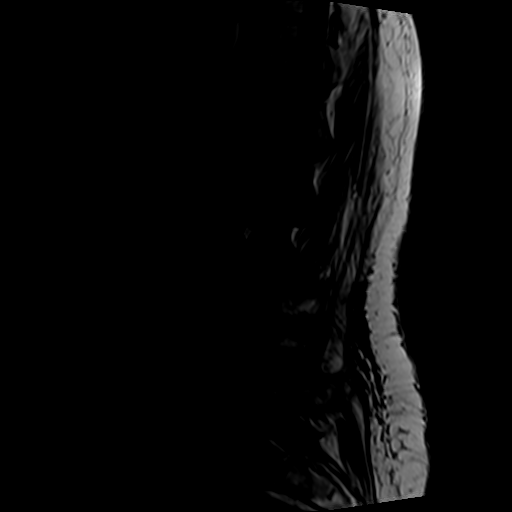
[im 19/19]
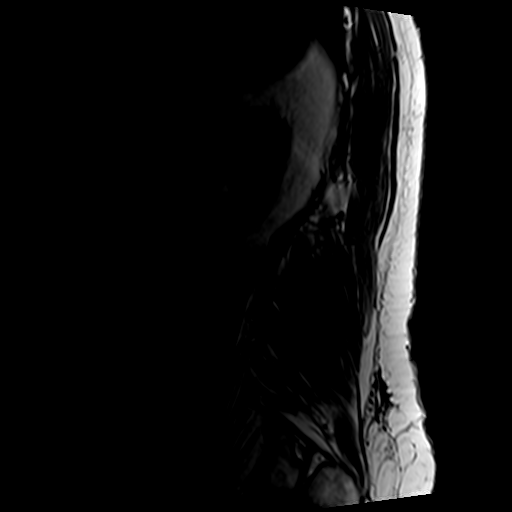

[Series 6: T2 · axial · 4.0mm · 0.78mm/px · z∈[-70,+149]mm · 8 of 42 slices shown (2 of 2)]
[im 1/42]
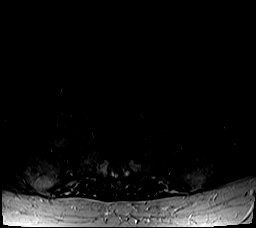
[im 7/42]
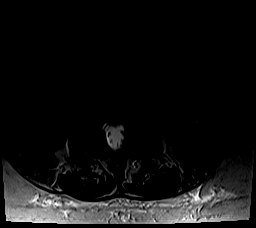
[im 13/42]
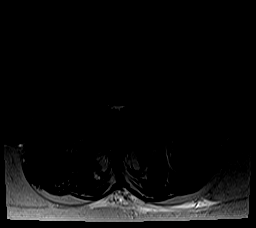
[im 19/42]
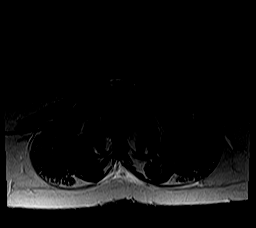
[im 23/42]
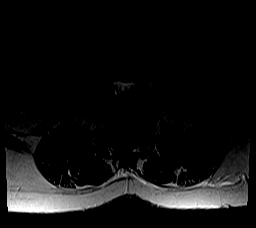
[im 29/42]
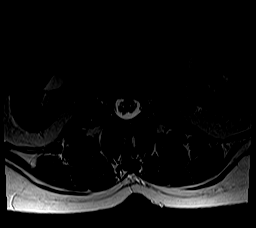
[im 35/42]
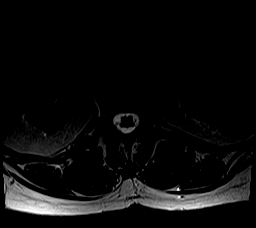
[im 42/42]
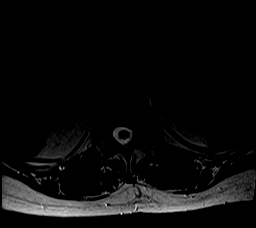

[Series 7: T1 · axial · 4.0mm · 0.39mm/px · z∈[-39,+112]mm · 3 of 42 slices shown (2 of 2)]
[im 7/42]
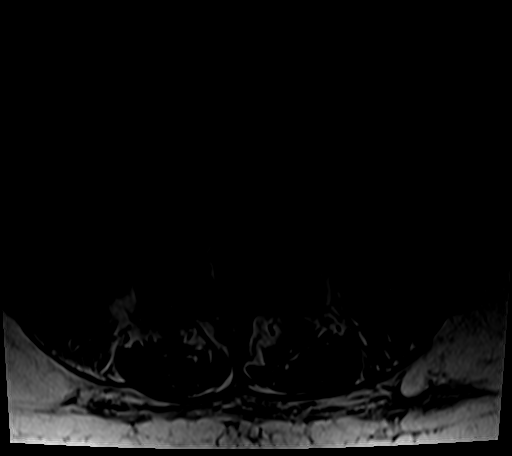
[im 23/42]
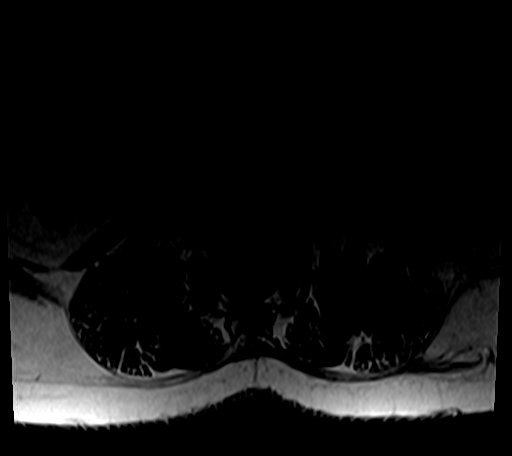
[im 35/42]
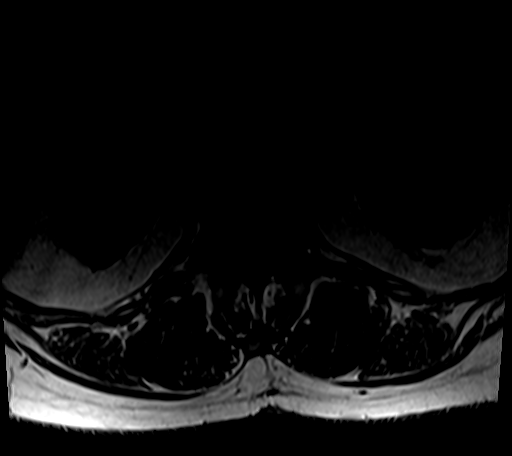

[23 of 48 positions shown; findings below may reference images not displayed]

FINDINGS: Segmentation: Normal on the radiographs last year, the same
numbering system used on the [EF] MRI.

Alignment: Stable somewhat exaggerated lumbar lordosis. Mild chronic
anterolisthesis of L4 on L5, and similar chronic retrolisthesis or
mild retropulsion of L2 on L3. mild underlying levoconvex lumbar
scoliosis.

Vertebrae: Stable chronic L2 compression fracture with mild endplate
retropulsion. No marrow edema or evidence of acute osseous
abnormality. Normal background bone marrow signal. Intact visible
sacrum.

Conus medullaris and cauda equina: Conus extends to the L1-L2 level.
No lower spinal cord or conus signal abnormality.

Paraspinal and other soft tissues: Stable, negative.

Disc levels:

Visible lower thoracic levels through T12-L1 are normal for age.

L1-L2: Chronic disc bulging eccentric to the right has mildly
progressed. Effaced ventral CSF space now at the conus but overall
no significant stenosis.

L2-L3: Chronic multifactorial spinal stenosis appears progressed.
Circumferential disc osteophyte complex, in part related to mild L2
posteroinferior endplate retropulsion with moderate to severe facet
and ligament flavum hypertrophy. Moderate spinal stenosis here just
below the conus (series 6, image 17). Moderate left and mild right
L2 foraminal stenosis appear more stable.

L3-L4: Chronic multifactorial spinal stenosis appears mildly
progressed. Circumferential but mostly far lateral disc bulging with
similar moderate to severe facet and ligament flavum hypertrophy to
the level above. Severe spinal stenosis (series 6, image 23). But
only mild L3 foraminal stenosis greater on the right.

L4-L5: Mild anterolisthesis. Mild circumferential disc bulge.
Moderate to severe facet hypertrophy. Stable mild spinal stenosis.
There is a new small left foraminal synovial cyst projecting into
the superior left L4 neural foramen (series 3, image 14). But mild
bilateral L4 foraminal stenosis appears not significantly changed.

L5-S1: Subtle anterolisthesis. Negative disc. Moderate to severe
facet hypertrophy greater on the right, with trace degenerative
facet joint fluid on that side. No spinal stenosis. Stable mild left
and borderline right L5 foraminal stenosis.
IMPRESSION: 1. Multifactorial lumbar spinal stenosis at both L2-L3 and L3-L4
appear mildly progressed from last year - moderate to severe.
Stenosis at the former is just below the level of the conus, and in
part related to chronic L2 compression fracture. Up to moderate left
L2 foraminal stenosis is stable.

2. Stable mild anterolisthesis and spinal stenosis at L4-L5. A tiny
synovial cyst of the left facets at that level is new and projects
into the superior left L4 neural foramen, but mild foraminal
stenosis appears stable.

3. Chronic facet arthropathy at L5-S1 is stable with only mild left
L5 foraminal stenosis.

## 2021-10-05 DIAGNOSIS — M48062 Spinal stenosis, lumbar region with neurogenic claudication: Secondary | ICD-10-CM | POA: Diagnosis not present

## 2021-10-05 DIAGNOSIS — Z6830 Body mass index (BMI) 30.0-30.9, adult: Secondary | ICD-10-CM | POA: Diagnosis not present

## 2021-10-05 DIAGNOSIS — I10 Essential (primary) hypertension: Secondary | ICD-10-CM | POA: Diagnosis not present

## 2021-10-06 ENCOUNTER — Other Ambulatory Visit: Payer: Self-pay | Admitting: Neurological Surgery

## 2021-10-08 ENCOUNTER — Other Ambulatory Visit: Payer: Self-pay | Admitting: Neurological Surgery

## 2021-10-08 DIAGNOSIS — M545 Low back pain, unspecified: Secondary | ICD-10-CM | POA: Diagnosis not present

## 2021-10-08 DIAGNOSIS — I4510 Unspecified right bundle-branch block: Secondary | ICD-10-CM | POA: Diagnosis not present

## 2021-10-08 DIAGNOSIS — I10 Essential (primary) hypertension: Secondary | ICD-10-CM | POA: Diagnosis not present

## 2021-10-08 DIAGNOSIS — Z01818 Encounter for other preprocedural examination: Secondary | ICD-10-CM | POA: Diagnosis not present

## 2021-10-08 DIAGNOSIS — E785 Hyperlipidemia, unspecified: Secondary | ICD-10-CM | POA: Diagnosis not present

## 2021-10-08 DIAGNOSIS — G8929 Other chronic pain: Secondary | ICD-10-CM | POA: Diagnosis not present

## 2021-10-08 NOTE — Pre-Procedure Instructions (Signed)
Surgical Instructions    Your procedure is scheduled on Tuesday, October 13, 2021 at 8:45 AM.  Report to [facility] Main Entrance "A" at 6:45 A.M., then check in with the Admitting office.  Call this number if you have problems the morning of surgery:  [phone]   If you have any questions prior to your surgery date call [phone]: Open Monday-Friday 8am-4pm    Remember:  Do not eat or drink after midnight the night before your surgery    Take these medicines the morning of surgery with A SIP OF WATER:  amLODipine (NORVASC) rosuvastatin (CRESTOR)  acetaminophen (TYLENOL) - if needed  As of today, STOP taking any Aspirin (unless otherwise instructed by your surgeon) Aleve, Naproxen, Ibuprofen, Motrin, Advil, Goody's, BC's, all herbal medications, fish oil, and all vitamins.                     Do NOT Smoke (Tobacco/Vaping) or drink Alcohol 24 hours prior to your procedure.  If you use a CPAP at night, you may bring all equipment for your overnight stay.   Contacts, glasses, piercing's, hearing aid's, dentures or partials may not be worn into surgery, please bring cases for these belongings.    For patients admitted to the hospital, discharge time will be determined by your treatment team.   Patients discharged the day of surgery will not be allowed to drive home, and someone needs to stay with them for 24 hours.  NO VISITORS WILL BE ALLOWED IN PRE-OP WHERE PATIENTS GET READY FOR SURGERY.  ONLY 1 SUPPORT PERSON MAY BE PRESENT IN THE WAITING ROOM WHILE YOU ARE IN SURGERY.  IF YOU ARE TO BE ADMITTED, ONCE YOU ARE IN YOUR ROOM YOU WILL BE ALLOWED TWO (2) VISITORS.  Minor children may have two parents present. Special consideration for safety and communication needs will be reviewed on a case by case basis.   Special instructions:   [place]- Preparing For Surgery  Before surgery, you can play an important role. Because skin is not sterile, your skin needs to be as free of  germs as possible. You can reduce the number of germs on your skin by washing with CHG (chlorahexidine gluconate) Soap before surgery.  CHG is an antiseptic cleaner which kills germs and bonds with the skin to continue killing germs even after washing.    Oral Hygiene is also important to reduce your risk of infection.  Remember - BRUSH YOUR TEETH THE MORNING OF SURGERY WITH YOUR REGULAR TOOTHPASTE  Please do not use if you have an allergy to CHG or antibacterial soaps. If your skin becomes reddened/irritated stop using the CHG.  Do not shave (including legs and underarms) for at least 48 hours prior to first CHG shower. It is OK to shave your face.  Please follow these instructions carefully.   Shower the NIGHT BEFORE SURGERY and the MORNING OF SURGERY  If you chose to wash your hair, wash your hair first as usual with your normal shampoo.  After you shampoo, rinse your hair and body thoroughly to remove the shampoo.  Use CHG Soap as you would any other liquid soap. You can apply CHG directly to the skin and wash gently with a scrungie or a clean washcloth.   Apply the CHG Soap to your body ONLY FROM THE NECK DOWN.  Do not use on open wounds or open sores. Avoid contact with your eyes, ears, mouth and genitals (private parts). Wash Face and genitals (  private parts)  with your normal soap.   Wash thoroughly, paying special attention to the area where your surgery will be performed.  Thoroughly rinse your body with warm water from the neck down.  DO NOT shower/wash with your normal soap after using and rinsing off the CHG Soap.  Pat yourself dry with a CLEAN TOWEL.  Wear CLEAN PAJAMAS to bed the night before surgery  Place CLEAN SHEETS on your bed the night before your surgery  DO NOT SLEEP WITH PETS.   Day of Surgery: Shower with CHG soap. Do not wear jewelry, make up, nail polish, gel polish, artificial nails, or any other type of covering on natural nails including finger and  toenails. If patients have artificial nails, gel coating, etc. that need to be removed by a nail salon please have this removed prior to surgery. Surgery may need to be canceled/delayed if the surgeon/ anesthesia feels like the patient is unable to be adequately monitored. Do not wear lotions, powders, perfumes, or deodorant. Do not shave 48 hours prior to surgery. Do not bring valuables to the hospital. [facility] is not responsible for any belongings or valuables. Wear Clean/Comfortable clothing the morning of surgery Remember to brush your teeth WITH YOUR REGULAR TOOTHPASTE.   Please read over the following fact sheets that you were given.   3 days prior to your procedure or After your COVID test   You are not required to quarantine however you are required to wear a well-fitting mask when you are out and around people not in your household. If your mask becomes wet or soiled, replace with a new one.   Wash your hands often with soap and water for 20 seconds or clean your hands with an alcohol-based hand sanitizer that contains at least 60% alcohol.   Do not share personal items.   Notify your provider:  o if you are in close contact with someone who has COVID  o or if you develop a fever of 100.4 or greater, sneezing, cough, sore throat, shortness of breath or body aches.

## 2021-10-09 ENCOUNTER — Other Ambulatory Visit: Payer: Self-pay

## 2021-10-09 ENCOUNTER — Encounter (HOSPITAL_COMMUNITY): Payer: Self-pay

## 2021-10-09 ENCOUNTER — Encounter (HOSPITAL_COMMUNITY)
Admission: RE | Admit: 2021-10-09 | Discharge: 2021-10-09 | Disposition: A | Discharge status: Home / Self Care | Payer: PPO | Source: Ambulatory Visit | Attending: Neurological Surgery | Admitting: Neurological Surgery

## 2021-10-09 VITALS — BP 149/84 | HR 88 | Temp 98.4°F | Resp 17 | Ht 69.0 in | Wt 205.4 lb

## 2021-10-09 DIAGNOSIS — Z20822 Contact with and (suspected) exposure to covid-19: Secondary | ICD-10-CM | POA: Insufficient documentation

## 2021-10-09 DIAGNOSIS — E785 Hyperlipidemia, unspecified: Secondary | ICD-10-CM | POA: Diagnosis not present

## 2021-10-09 DIAGNOSIS — M48062 Spinal stenosis, lumbar region with neurogenic claudication: Secondary | ICD-10-CM | POA: Insufficient documentation

## 2021-10-09 DIAGNOSIS — I2510 Atherosclerotic heart disease of native coronary artery without angina pectoris: Secondary | ICD-10-CM

## 2021-10-09 DIAGNOSIS — I10 Essential (primary) hypertension: Secondary | ICD-10-CM | POA: Insufficient documentation

## 2021-10-09 DIAGNOSIS — Z01818 Encounter for other preprocedural examination: Secondary | ICD-10-CM | POA: Diagnosis not present

## 2021-10-09 HISTORY — DX: Solitary pulmonary nodule: R91.1

## 2021-10-09 HISTORY — DX: Other complications of anesthesia, initial encounter: T88.59XA

## 2021-10-09 HISTORY — DX: Hyperlipidemia, unspecified: E78.5

## 2021-10-09 HISTORY — DX: Essential (primary) hypertension: I10

## 2021-10-09 LAB — CBC
HCT: 40.1 % (ref 36.0–46.0)
Hemoglobin: 13 g/dL (ref 12.0–15.0)
MCH: 30.3 pg (ref 26.0–34.0)
MCHC: 32.4 g/dL (ref 30.0–36.0)
MCV: 93.5 fL (ref 80.0–100.0)
Platelets: 276 10*3/uL (ref 150–400)
RBC: 4.29 MIL/uL (ref 3.87–5.11)
RDW: 12.6 % (ref 11.5–15.5)
WBC: 6.3 10*3/uL (ref 4.0–10.5)
nRBC: 0 % (ref 0.0–0.2)

## 2021-10-09 LAB — BASIC METABOLIC PANEL
Anion gap: 5 (ref 5–15)
BUN: 11 mg/dL (ref 8–23)
CO2: 30 mmol/L (ref 22–32)
Calcium: 9.3 mg/dL (ref 8.9–10.3)
Chloride: 103 mmol/L (ref 98–111)
Creatinine, Ser: 0.96 mg/dL (ref 0.44–1.00)
GFR, Estimated: >60 mL/min (ref >60)
Glucose, Bld: 100 mg/dL — ABNORMAL HIGH (ref 70–99)
Potassium: 3.6 mmol/L (ref 3.5–5.1)
Sodium: 138 mmol/L (ref 135–145)

## 2021-10-09 LAB — SURGICAL PCR SCREEN
MRSA, PCR: NEGATIVE
Staphylococcus aureus: NEGATIVE

## 2021-10-09 LAB — SARS CORONAVIRUS 2 (TAT 6-24 HRS): SARS Coronavirus 2: NEGATIVE

## 2021-10-09 NOTE — Progress Notes (Signed)
PCP - Dr. Irene Pap Cardiologist - Denies  PPM/ICD - Denies  Chest x-ray - N/A EKG - Requested from PCP Stress Test - Denies ECHO - Denies Cardiac Cath - Denies  Sleep Study - Denies  Diabetic: Denies  Blood Thinner Instructions: N/A Aspirin Instructions: N/A  ERAS Protcol - No  COVID TEST- 10/09/21 in PAT   Anesthesia review: Yes, EKG tracing requested from PCP.  Patient denies shortness of breath, fever, cough and chest pain at PAT appointment   All instructions explained to the patient, with a verbal understanding of the material. Patient agrees to go over the instructions while at home for a better understanding. Patient also instructed to self quarantine after being tested for COVID-19. The opportunity to ask questions was provided.

## 2021-10-12 ENCOUNTER — Encounter (HOSPITAL_COMMUNITY): Payer: Self-pay

## 2021-10-12 DIAGNOSIS — I4510 Unspecified right bundle-branch block: Secondary | ICD-10-CM | POA: Diagnosis not present

## 2021-10-12 NOTE — Anesthesia Preprocedure Evaluation (Addendum)
Anesthesia Evaluation  Patient identified by MRN, date of birth, ID band Patient awake    Reviewed: Allergy & Precautions, H&P , NPO status , Patient's Chart, lab work & pertinent test results  History of Anesthesia Complications (+) PROLONGED EMERGENCE and history of anesthetic complications  Airway Mallampati: I  TM Distance: >3 FB Neck ROM: Full    Dental no notable dental hx. (+) Teeth Intact, Dental Advisory Given   Pulmonary neg pulmonary ROS,    Pulmonary exam normal breath sounds clear to auscultation       Cardiovascular Exercise Tolerance: Good hypertension, Pt. on medications negative cardio ROS Normal cardiovascular exam Rhythm:Regular Rate:Normal     Neuro/Psych negative neurological ROS  negative psych ROS   GI/Hepatic negative GI ROS, Neg liver ROS,   Endo/Other  negative endocrine ROS  Renal/GU negative Renal ROS  negative genitourinary   Musculoskeletal negative musculoskeletal ROS (+)   Abdominal   Peds negative pediatric ROS (+)  Hematology negative hematology ROS (+)   Anesthesia Other Findings   Reproductive/Obstetrics negative OB ROS                           Anesthesia Physical Anesthesia Plan  ASA: 3  Anesthesia Plan: General   Post-op Pain Management: Ofirmev IV (intra-op) and Ketamine IV   Induction: Intravenous  PONV Risk Score and Plan: 3 and Ondansetron, Dexamethasone and Treatment may vary due to age or medical condition  Airway Management Planned: Oral ETT  Additional Equipment: None  Intra-op Plan:   Post-operative Plan: Extubation in OR  Informed Consent: I have reviewed the patients History and Physical, chart, labs and discussed the procedure including the risks, benefits and alternatives for the proposed anesthesia with the patient or authorized representative who has indicated his/her understanding and acceptance.       Plan  Discussed with: Anesthesiologist and CRNA  Anesthesia Plan Comments: (PAT note written 10/12/2021 by Myra Gianotti, PA-C.  DISCUSSION: Patient is a 75 year old female scheduled for the above procedure.   History includes never smoker, HTN, dyslipidemia, L2 superior endplate compression fracture (age indeterminate on CT 03/2020), pulmonary nodule (pleural based LLL nodule, stable 05/13/21), rectocele (s/p ileococcygeus muscle fixation, enterocele repair 08/21/21), RUE fracture (~ 2007/2008, s/p ORIF). She has decreased function of her right shoulder ("80% use"). She reported that it "Took a while to wake up after wrist surgery" ~ 2007/2008.   PCP Irene Pap, DO ([facility]) evaluated patient on 10/08/21 for preoperative evaluation. Per Dr. Rayann Heman, "Pre-operative clearance Patient's office EKG shows a right bundle branch block without any prior EKGs for comparison. Patient is currently asymptomatic and denies any chest pain, dizziness, difficulty breathing, or syncope. Patient is cleared for surgery." )       Anesthesia Quick Evaluation

## 2021-10-12 NOTE — Progress Notes (Signed)
Anesthesia Chart Review:  Case: [id number] Date/Time: 10/13/21 0830   Procedure: OPEN LAMINECTOMY, L23, L34 - 3C   Anesthesia type: General   Pre-op diagnosis: SPINAL STENOSIS, LUMBAR REGION WITH NEUROGENIC CLAUDICATION   Location: [place] OR ROOM 58 / [place] OR   Surgeons: Dawley, Theodoro Doing, DO       DISCUSSION: Patient is a 75 year old female scheduled for the above procedure.   History includes never smoker, HTN, dyslipidemia, L2 superior endplate compression fracture (age indeterminate on CT 03/2020), pulmonary nodule (pleural based LLL nodule, stable 05/13/21), rectocele (s/p ileococcygeus muscle fixation, enterocele repair 08/21/21), RUE fracture (~ 2007/2008, s/p ORIF). She has decreased function of her right shoulder ("80% use"). She reported that it "Took a while to wake up after wrist surgery" ~ 2007/2008.    PCP Irene Pap, DO ([facility]) evaluated patient on 10/08/21 for preoperative evaluation. Per Dr. Rayann Heman, "Pre-operative clearance Patient's office EKG shows a right bundle branch block without any prior EKGs for comparison. Patient is currently asymptomatic and denies any chest pain, dizziness, difficulty breathing, or syncope. Patient is cleared for surgery."   10/08/21 EKG from Dr. Jackalyn Lombard office requested, but not indicates that is showed a RBBB.   10/09/2021 presurgical COVID-19 test negative.  Anesthesia team to evaluate on the day of surgery.   VS: BP (!) 149/84    Pulse 88    Temp 36.9 C (Oral)    Resp 17    Ht 5\' 9"  (1.753 m)    Wt 93.2 kg    SpO2 99%    BMI 30.33 kg/m    PROVIDERS: Irene Pap, DO is PCP ([facility])  [place] Pink, MD is urologist ([place])   LABS: Labs reviewed: Acceptable for surgery. (all labs ordered are listed, but only abnormal results are displayed)  Labs Reviewed  BASIC METABOLIC PANEL - Abnormal; Notable for the following components:      Result Value    Glucose, Bld 100 (*)    All other components within normal limits  SARS CORONAVIRUS 2 (TAT 6-24 HRS)  SURGICAL PCR SCREEN  CBC    IMAGES: MRI L-spine 10/02/21: IMPRESSION: 1. 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 Stenosis at the former is just below the level of the conus, and in part related to chronic L2 compression fracture. Up to moderate left L2 foraminal stenosis is stable. 2. Stable mild anterolisthesis and spinal stenosis at L4-L5. A tiny synovial cyst of the left facets at that level is new and projects into the superior left L4 neural foramen, but mild foraminal stenosis appears stable. 3. 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    CT Chest 05/13/21 ([facility]): FINDINGS:  Thoracic inlet/central airways: Thyroid normal. Airway patent.  Mediastinum/hila/axilla: No adenopathy.  Heart/vessels: Normal heart size. No pericardial effusion. Aorta normal in caliber. Atherosclerosis of the thoracic aorta. Coronary artery calcifications.  Lungs/pleura: Mild centrilobular emphysema. Central bronchiectasis and bronchial wall thickening. More focal bronchiectasis in the posterior right upper lobe. 8 mm pleural-based nodule lateral basal left lower lobe (3-172.)  Upper abdomen: Unremarkable.  Chest wall/MSK: Endplate deformity of L2 again visualized with anterior wedging and 20% loss of height, unchanged, compatible with remote compression deformity. DISH of the thoracic spine.  CONCLUSION:  1. No change over one year in an 8 mm pleural-based nodule. Recommend follow-up limited  CT in one year to document stability.  2. Ancillary findings as above.   EKG: See DISCUSSION. Requested from PCP.    CV: N/A  Past Medical History:  Diagnosis Date   Complication of anesthesia    Took a while to wake up after wrist surgery   Dyslipidemia    Hypertension     Neuromuscular disorder (HCC)    Right shoulder 80 percent use    Pulmonary nodule    8 mm pleural based LLL nodule, stabel 05/13/21 CT ([facility])    Past Surgical History:  Procedure Laterality Date   ANKLE FRACTURE SURGERY     L ankle   CATARACT EXTRACTION W/ INTRAOCULAR LENS IMPLANT Bilateral    ENTEROCELE REPAIR  08/21/2021   EYE SURGERY     TONSILLECTOMY     TRIGGER FINGER RELEASE Right 02/17/2015   Procedure:  RIGHT LONG FINGER AND RING FINGER TRIGGER RELEASE;  Surgeon: Milly Jakob, MD;  Location: [facility];  Service: Orthopedics;  Laterality: Right;   WRIST FRACTURE SURGERY     R wrist   WRIST SURGERY Right 10/25/2005    MEDICATIONS:  acetaminophen (TYLENOL) 325 MG tablet   amLODipine (NORVASC) 5 MG tablet   HYDROcodone-acetaminophen (NORCO) 5-325 MG tablet   rosuvastatin (CRESTOR) 10 MG tablet   No current facility-administered medications for this encounter.    Myra Gianotti, PA-C Surgical Short Stay/Anesthesiology [facility] Phone [phone] [facility] Phone [phone] 10/12/2021 11:03 AM

## 2021-10-13 ENCOUNTER — Ambulatory Visit (HOSPITAL_COMMUNITY): Payer: PPO | Admitting: Anesthesiology

## 2021-10-13 ENCOUNTER — Observation Stay (HOSPITAL_COMMUNITY)
Admission: RE | Admit: 2021-10-13 | Discharge: 2021-10-14 | Disposition: A | Discharge status: Home / Self Care | Payer: PPO | Attending: Neurological Surgery | Admitting: Neurological Surgery

## 2021-10-13 ENCOUNTER — Ambulatory Visit (HOSPITAL_COMMUNITY): Payer: PPO | Admitting: Vascular Surgery

## 2021-10-13 ENCOUNTER — Other Ambulatory Visit: Payer: Self-pay

## 2021-10-13 ENCOUNTER — Ambulatory Visit (HOSPITAL_COMMUNITY): Payer: PPO

## 2021-10-13 ENCOUNTER — Encounter (HOSPITAL_COMMUNITY): Payer: Self-pay | Admitting: Neurological Surgery

## 2021-10-13 ENCOUNTER — Encounter (HOSPITAL_COMMUNITY)
Admission: RE | Disposition: A | Discharge status: Home / Self Care | Payer: Self-pay | Source: Home / Self Care | Attending: Neurological Surgery

## 2021-10-13 DIAGNOSIS — I10 Essential (primary) hypertension: Secondary | ICD-10-CM | POA: Insufficient documentation

## 2021-10-13 DIAGNOSIS — Z791 Long term (current) use of non-steroidal anti-inflammatories (NSAID): Secondary | ICD-10-CM | POA: Insufficient documentation

## 2021-10-13 DIAGNOSIS — M48062 Spinal stenosis, lumbar region with neurogenic claudication: Secondary | ICD-10-CM | POA: Diagnosis not present

## 2021-10-13 DIAGNOSIS — Z419 Encounter for procedure for purposes other than remedying health state, unspecified: Secondary | ICD-10-CM

## 2021-10-13 DIAGNOSIS — Z79899 Other long term (current) drug therapy: Secondary | ICD-10-CM | POA: Insufficient documentation

## 2021-10-13 HISTORY — PX: LUMBAR LAMINECTOMY/DECOMPRESSION MICRODISCECTOMY: SHX5026

## 2021-10-13 IMAGING — RF DG LUMBAR SPINE 1V
1 series · 2 of 2 positions shown · non-contrast
Comparison: [DATE]

CLINICAL DATA: Localization for laminectomy L2-3, L3-4

EXAM:
LUMBAR SPINE - 1 VIEW

[Series 1: run · 2 of 2 slices shown]
[im 1/2]
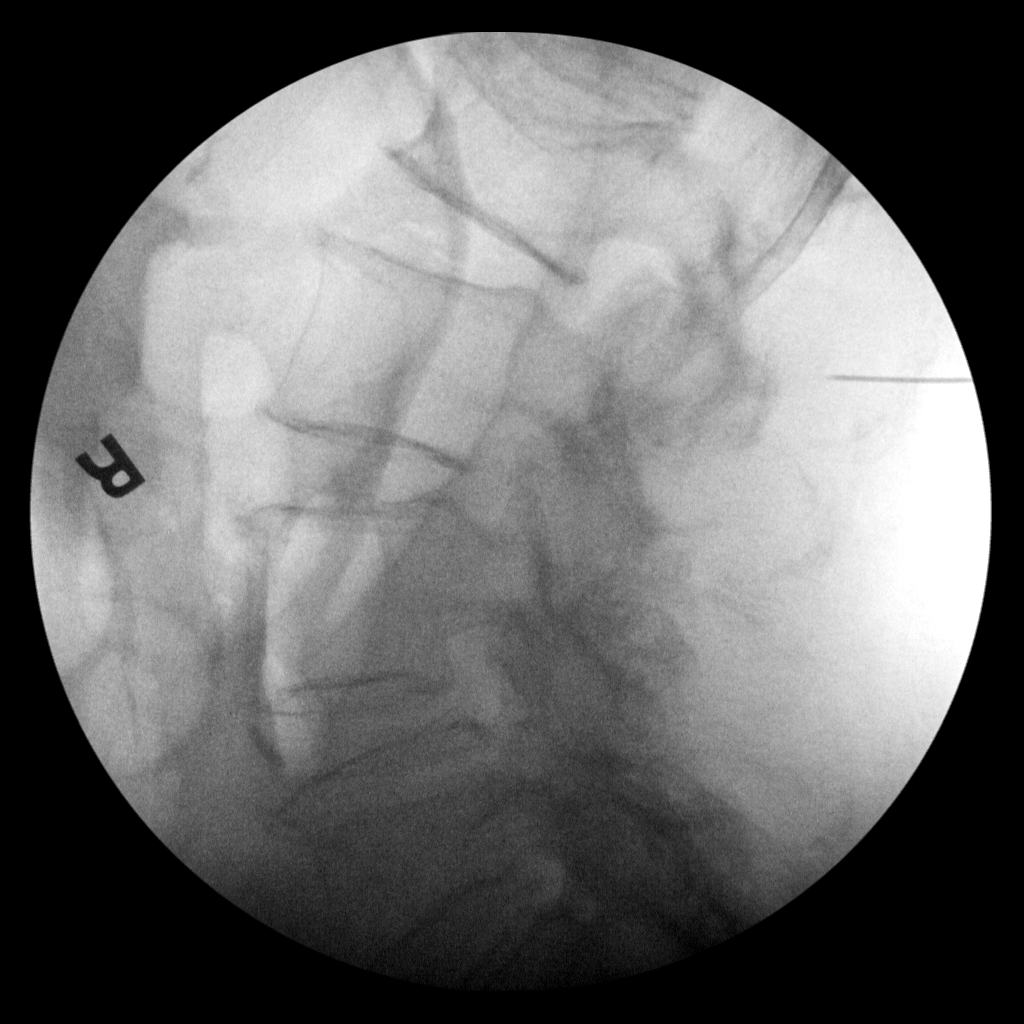
[im 2/2]
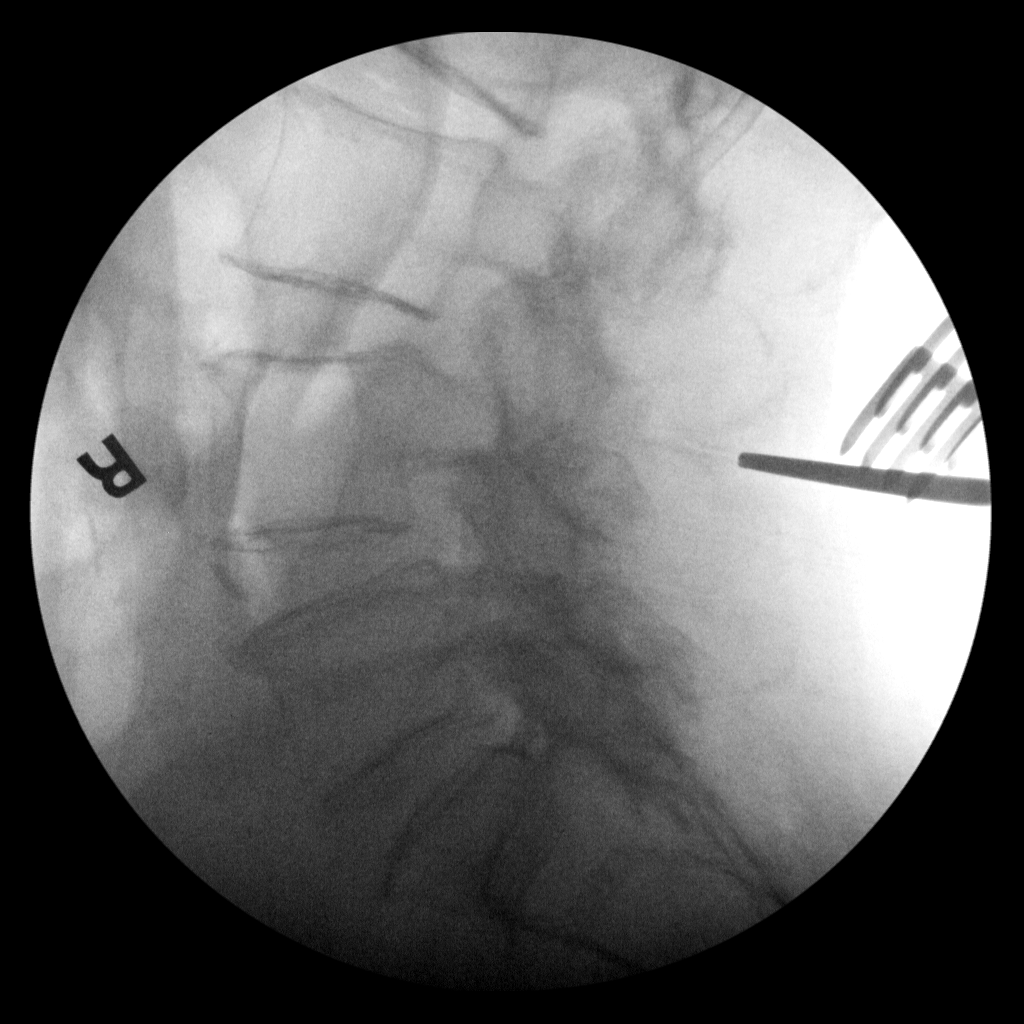

[2 of 2 positions shown; findings below may reference images not displayed]

FINDINGS: First intraoperative spot image demonstrates posterior needle
directed at the L2-3 level, overlying the L2 spinous process.

Second lateral intraoperative image demonstrates posterior surgical
instruments directed at the L4 pedicles and L3-4 interspace.
IMPRESSION: Intraoperative localization as above.

## 2021-10-13 SURGERY — LUMBAR LAMINECTOMY/DECOMPRESSION MICRODISCECTOMY 2 LEVELS
Anesthesia: General | Site: Spine Lumbar

## 2021-10-13 MED ORDER — ROCURONIUM BROMIDE 10 MG/ML (PF) SYRINGE
PREFILLED_SYRINGE | INTRAVENOUS | Status: DC | PRN
  Administered 2021-10-13: 100 mg via INTRAVENOUS

## 2021-10-13 MED ORDER — ROCURONIUM BROMIDE 10 MG/ML (PF) SYRINGE
PREFILLED_SYRINGE | INTRAVENOUS | Status: AC
  Filled 2021-10-13: qty 10

## 2021-10-13 MED ORDER — SODIUM CHLORIDE 0.9 % IV SOLN
250.0000 mL | INTRAVENOUS | Status: DC
  Administered 2021-10-13: 14:00:00 250 mL via INTRAVENOUS

## 2021-10-13 MED ORDER — CEFAZOLIN SODIUM-DEXTROSE 2-4 GM/100ML-% IV SOLN
2.0000 g | Freq: Three times a day (TID) | INTRAVENOUS | Status: AC
  Administered 2021-10-13 – 2021-10-14 (×2): 2 g via INTRAVENOUS
  Filled 2021-10-13 (×2): qty 100

## 2021-10-13 MED ORDER — MIDAZOLAM HCL 2 MG/2ML IJ SOLN
0.5000 mg | Freq: Once | INTRAMUSCULAR | Status: AC
  Administered 2021-10-13: 12:00:00 0.5 mg via INTRAVENOUS

## 2021-10-13 MED ORDER — MEPERIDINE HCL 25 MG/ML IJ SOLN: 6.2500 mg | INTRAMUSCULAR | Status: DC | PRN

## 2021-10-13 MED ORDER — SODIUM CHLORIDE 0.9% FLUSH: 3.0000 mL | INTRAVENOUS | Status: DC | PRN

## 2021-10-13 MED ORDER — METHYLPREDNISOLONE ACETATE 80 MG/ML IJ SUSP
INTRAMUSCULAR | Status: AC
  Filled 2021-10-13: qty 1

## 2021-10-13 MED ORDER — MICROFIBRILLAR COLL HEMOSTAT EX POWD
CUTANEOUS | Status: DC | PRN
  Administered 2021-10-13: 1 g via TOPICAL

## 2021-10-13 MED ORDER — THROMBIN 5000 UNITS EX SOLR
CUTANEOUS | Status: DC | PRN
  Administered 2021-10-13 (×2): 5000 [IU] via TOPICAL

## 2021-10-13 MED ORDER — MICROFIBRILLAR COLL HEMOSTAT EX POWD
CUTANEOUS | Status: AC
  Filled 2021-10-13: qty 5

## 2021-10-13 MED ORDER — MIDAZOLAM HCL 2 MG/2ML IJ SOLN
INTRAMUSCULAR | Status: AC
  Filled 2021-10-13: qty 2

## 2021-10-13 MED ORDER — LACTATED RINGERS IV SOLN: INTRAVENOUS | Status: DC

## 2021-10-13 MED ORDER — CEFAZOLIN SODIUM-DEXTROSE 2-4 GM/100ML-% IV SOLN
2.0000 g | INTRAVENOUS | Status: AC
  Administered 2021-10-13: 09:00:00 2 g via INTRAVENOUS
  Filled 2021-10-13: qty 100

## 2021-10-13 MED ORDER — CHLORHEXIDINE GLUCONATE CLOTH 2 % EX PADS: 6.0000 | MEDICATED_PAD | Freq: Once | CUTANEOUS | Status: DC

## 2021-10-13 MED ORDER — PROPOFOL 10 MG/ML IV BOLUS
INTRAVENOUS | Status: DC | PRN
  Administered 2021-10-13: 180 mg via INTRAVENOUS

## 2021-10-13 MED ORDER — ACETAMINOPHEN 160 MG/5ML PO SOLN: 325.0000 mg | ORAL | Status: DC | PRN

## 2021-10-13 MED ORDER — FENTANYL CITRATE (PF) 100 MCG/2ML IJ SOLN
INTRAMUSCULAR | Status: AC
  Filled 2021-10-13: qty 2

## 2021-10-13 MED ORDER — FENTANYL CITRATE (PF) 250 MCG/5ML IJ SOLN
INTRAMUSCULAR | Status: DC | PRN
  Administered 2021-10-13: 100 ug via INTRAVENOUS

## 2021-10-13 MED ORDER — BUPIVACAINE-EPINEPHRINE 0.5% -1:200000 IJ SOLN
INTRAMUSCULAR | Status: AC
  Filled 2021-10-13: qty 1

## 2021-10-13 MED ORDER — HYDROCODONE-ACETAMINOPHEN 10-325 MG PO TABS
1.0000 | ORAL_TABLET | ORAL | Status: DC | PRN
  Administered 2021-10-13 – 2021-10-14 (×4): 1 via ORAL
  Filled 2021-10-13 (×4): qty 1

## 2021-10-13 MED ORDER — MENTHOL 3 MG MT LOZG: 1.0000 | LOZENGE | OROMUCOSAL | Status: DC | PRN

## 2021-10-13 MED ORDER — THROMBIN 5000 UNITS EX SOLR
CUTANEOUS | Status: AC
  Filled 2021-10-13: qty 5000

## 2021-10-13 MED ORDER — METHOCARBAMOL 1000 MG/10ML IJ SOLN
500.0000 mg | Freq: Four times a day (QID) | INTRAVENOUS | Status: DC | PRN
  Filled 2021-10-13: qty 5

## 2021-10-13 MED ORDER — HYDROCODONE-ACETAMINOPHEN 7.5-325 MG PO TABS
1.0000 | ORAL_TABLET | ORAL | Status: DC | PRN
  Administered 2021-10-13: 14:00:00 1 via ORAL
  Filled 2021-10-13: qty 1

## 2021-10-13 MED ORDER — LIDOCAINE 2% (20 MG/ML) 5 ML SYRINGE
INTRAMUSCULAR | Status: DC | PRN
  Administered 2021-10-13: 100 mg via INTRAVENOUS

## 2021-10-13 MED ORDER — DEXAMETHASONE SODIUM PHOSPHATE 10 MG/ML IJ SOLN
INTRAMUSCULAR | Status: DC | PRN
  Administered 2021-10-13: 10 mg via INTRAVENOUS

## 2021-10-13 MED ORDER — 0.9 % SODIUM CHLORIDE (POUR BTL) OPTIME
TOPICAL | Status: DC | PRN
  Administered 2021-10-13: 09:00:00 500 mL

## 2021-10-13 MED ORDER — ONDANSETRON HCL 4 MG/2ML IJ SOLN
4.0000 mg | Freq: Four times a day (QID) | INTRAMUSCULAR | Status: DC | PRN
  Administered 2021-10-13: 22:00:00 4 mg via INTRAVENOUS
  Filled 2021-10-13: qty 2

## 2021-10-13 MED ORDER — DEXAMETHASONE SODIUM PHOSPHATE 10 MG/ML IJ SOLN
INTRAMUSCULAR | Status: AC
  Filled 2021-10-13: qty 1

## 2021-10-13 MED ORDER — BUPIVACAINE LIPOSOME 1.3 % IJ SUSP
INTRAMUSCULAR | Status: AC
  Filled 2021-10-13: qty 20

## 2021-10-13 MED ORDER — ONDANSETRON HCL 4 MG PO TABS: 4.0000 mg | ORAL_TABLET | Freq: Four times a day (QID) | ORAL | Status: DC | PRN

## 2021-10-13 MED ORDER — PHENOL 1.4 % MT LIQD: 1.0000 | OROMUCOSAL | Status: DC | PRN

## 2021-10-13 MED ORDER — THROMBIN 5000 UNITS EX SOLR
CUTANEOUS | Status: AC
  Filled 2021-10-13: qty 10000

## 2021-10-13 MED ORDER — AMLODIPINE BESYLATE 5 MG PO TABS
5.0000 mg | ORAL_TABLET | Freq: Every day | ORAL | Status: DC
  Administered 2021-10-13 – 2021-10-14 (×2): 5 mg via ORAL
  Filled 2021-10-13 (×2): qty 1

## 2021-10-13 MED ORDER — GELATIN ABSORBABLE 12-7 MM EX MISC
CUTANEOUS | Status: DC | PRN
  Administered 2021-10-13: 1

## 2021-10-13 MED ORDER — THROMBIN 5000 UNITS EX SOLR
OROMUCOSAL | Status: DC | PRN
  Administered 2021-10-13: 10:00:00 5 mL via TOPICAL

## 2021-10-13 MED ORDER — OXYCODONE HCL 5 MG PO TABS: 5.0000 mg | ORAL_TABLET | Freq: Once | ORAL | Status: DC | PRN

## 2021-10-13 MED ORDER — ACETAMINOPHEN 650 MG RE SUPP: 650.0000 mg | RECTAL | Status: DC | PRN

## 2021-10-13 MED ORDER — LIDOCAINE 2% (20 MG/ML) 5 ML SYRINGE
INTRAMUSCULAR | Status: AC
  Filled 2021-10-13: qty 5

## 2021-10-13 MED ORDER — KETOROLAC TROMETHAMINE 15 MG/ML IJ SOLN
7.5000 mg | Freq: Four times a day (QID) | INTRAMUSCULAR | Status: AC
  Administered 2021-10-13 – 2021-10-14 (×4): 7.5 mg via INTRAVENOUS
  Filled 2021-10-13 (×4): qty 1

## 2021-10-13 MED ORDER — SUGAMMADEX SODIUM 200 MG/2ML IV SOLN
INTRAVENOUS | Status: DC | PRN
  Administered 2021-10-13: 200 mg via INTRAVENOUS

## 2021-10-13 MED ORDER — BUPIVACAINE-EPINEPHRINE (PF) 0.5% -1:200000 IJ SOLN
INTRAMUSCULAR | Status: DC | PRN
  Administered 2021-10-13: 5 mL

## 2021-10-13 MED ORDER — FENTANYL CITRATE (PF) 100 MCG/2ML IJ SOLN
INTRAMUSCULAR | Status: DC | PRN
  Administered 2021-10-13: 100 ug via INTRAVENOUS

## 2021-10-13 MED ORDER — LIDOCAINE-EPINEPHRINE 1 %-1:100000 IJ SOLN
INTRAMUSCULAR | Status: AC
  Filled 2021-10-13: qty 1

## 2021-10-13 MED ORDER — ONDANSETRON HCL 4 MG/2ML IJ SOLN: 4.0000 mg | Freq: Once | INTRAMUSCULAR | Status: DC | PRN

## 2021-10-13 MED ORDER — ACETAMINOPHEN 325 MG PO TABS: 650.0000 mg | ORAL_TABLET | ORAL | Status: DC | PRN

## 2021-10-13 MED ORDER — EPHEDRINE 5 MG/ML INJ
INTRAVENOUS | Status: AC
  Filled 2021-10-13: qty 5

## 2021-10-13 MED ORDER — ONDANSETRON HCL 4 MG/2ML IJ SOLN
INTRAMUSCULAR | Status: DC | PRN
  Administered 2021-10-13: 4 mg via INTRAVENOUS

## 2021-10-13 MED ORDER — ACETAMINOPHEN 325 MG PO TABS: 325.0000 mg | ORAL_TABLET | ORAL | Status: DC | PRN

## 2021-10-13 MED ORDER — METHYLPREDNISOLONE ACETATE 80 MG/ML IJ SUSP
INTRAMUSCULAR | Status: DC | PRN
  Administered 2021-10-13: 80 mg

## 2021-10-13 MED ORDER — FENTANYL CITRATE (PF) 100 MCG/2ML IJ SOLN
25.0000 ug | INTRAMUSCULAR | Status: DC | PRN
  Administered 2021-10-13 (×2): 50 ug via INTRAVENOUS

## 2021-10-13 MED ORDER — OXYCODONE HCL 5 MG/5ML PO SOLN: 5.0000 mg | Freq: Once | ORAL | Status: DC | PRN

## 2021-10-13 MED ORDER — ONDANSETRON HCL 4 MG/2ML IJ SOLN
INTRAMUSCULAR | Status: AC
  Filled 2021-10-13: qty 2

## 2021-10-13 MED ORDER — FENTANYL CITRATE (PF) 250 MCG/5ML IJ SOLN
INTRAMUSCULAR | Status: AC
  Filled 2021-10-13: qty 5

## 2021-10-13 MED ORDER — DOCUSATE SODIUM 100 MG PO CAPS
100.0000 mg | ORAL_CAPSULE | Freq: Two times a day (BID) | ORAL | Status: DC
  Administered 2021-10-14: 10:00:00 100 mg via ORAL
  Filled 2021-10-13: qty 1

## 2021-10-13 MED ORDER — EPHEDRINE SULFATE-NACL 50-0.9 MG/10ML-% IV SOSY
PREFILLED_SYRINGE | INTRAVENOUS | Status: DC | PRN
  Administered 2021-10-13: 10 mg via INTRAVENOUS

## 2021-10-13 MED ORDER — CHLORHEXIDINE GLUCONATE 0.12 % MT SOLN
15.0000 mL | Freq: Once | OROMUCOSAL | Status: AC
  Administered 2021-10-13: 07:00:00 15 mL via OROMUCOSAL
  Filled 2021-10-13: qty 15

## 2021-10-13 MED ORDER — BUPIVACAINE LIPOSOME 1.3 % IJ SUSP
INTRAMUSCULAR | Status: DC | PRN
  Administered 2021-10-13: 20 mL

## 2021-10-13 MED ORDER — ROSUVASTATIN CALCIUM 5 MG PO TABS
10.0000 mg | ORAL_TABLET | Freq: Every day | ORAL | Status: DC
  Administered 2021-10-13 – 2021-10-14 (×2): 10 mg via ORAL
  Filled 2021-10-13 (×2): qty 2

## 2021-10-13 MED ORDER — SODIUM CHLORIDE 0.9% FLUSH
3.0000 mL | Freq: Two times a day (BID) | INTRAVENOUS | Status: DC
  Administered 2021-10-13 (×2): 3 mL via INTRAVENOUS

## 2021-10-13 MED ORDER — METHOCARBAMOL 500 MG PO TABS
500.0000 mg | ORAL_TABLET | Freq: Four times a day (QID) | ORAL | Status: DC | PRN
  Administered 2021-10-13 – 2021-10-14 (×3): 500 mg via ORAL
  Filled 2021-10-13 (×3): qty 1

## 2021-10-13 MED ORDER — PHENYLEPHRINE 40 MCG/ML (10ML) SYRINGE FOR IV PUSH (FOR BLOOD PRESSURE SUPPORT)
PREFILLED_SYRINGE | INTRAVENOUS | Status: DC | PRN
  Administered 2021-10-13 (×2): 80 ug via INTRAVENOUS

## 2021-10-13 MED ORDER — PROPOFOL 10 MG/ML IV BOLUS
INTRAVENOUS | Status: AC
  Filled 2021-10-13: qty 20

## 2021-10-13 MED ORDER — MORPHINE SULFATE (PF) 2 MG/ML IV SOLN: 2.0000 mg | INTRAVENOUS | Status: DC | PRN

## 2021-10-13 MED ORDER — LIDOCAINE-EPINEPHRINE 1 %-1:100000 IJ SOLN
INTRAMUSCULAR | Status: DC | PRN
  Administered 2021-10-13: 5 mL

## 2021-10-13 MED ORDER — ORAL CARE MOUTH RINSE: 15.0000 mL | Freq: Once | OROMUCOSAL | Status: AC

## 2021-10-13 SURGICAL SUPPLY — 57 items
BAG COUNTER SPONGE SURGICOUNT (BAG) ×2 IMPLANT
BAG SURGICOUNT SPONGE COUNTING (BAG) ×1
BAND RUBBER #18 3X1/16 STRL (MISCELLANEOUS) ×6 IMPLANT
BUR CARBIDE MATCH 3.0 (BURR) ×3 IMPLANT
CARTRIDGE OIL MAESTRO DRILL (MISCELLANEOUS) ×1 IMPLANT
CNTNR URN SCR LID CUP LEK RST (MISCELLANEOUS) ×1 IMPLANT
CONT SPEC 4OZ STRL OR WHT (MISCELLANEOUS) ×3
COVER MAYO STAND STRL (DRAPES) ×3 IMPLANT
DECANTER SPIKE VIAL GLASS SM (MISCELLANEOUS) ×1 IMPLANT
DERMABOND ADVANCED (GAUZE/BANDAGES/DRESSINGS) ×2
DERMABOND ADVANCED .7 DNX12 (GAUZE/BANDAGES/DRESSINGS) IMPLANT
DIFFUSER DRILL AIR PNEUMATIC (MISCELLANEOUS) ×1 IMPLANT
DRAIN JACKSON RD 7FR 3/32 (WOUND CARE) IMPLANT
DRAPE C-ARM 42X72 X-RAY (DRAPES) ×3 IMPLANT
DRAPE LAPAROTOMY 100X72X124 (DRAPES) ×3 IMPLANT
DRAPE MICROSCOPE LEICA (MISCELLANEOUS) ×3 IMPLANT
DRAPE SURG 17X23 STRL (DRAPES) ×3 IMPLANT
DRSG OPSITE POSTOP 4X6 (GAUZE/BANDAGES/DRESSINGS) ×2 IMPLANT
DURAPREP 26ML APPLICATOR (WOUND CARE) ×3 IMPLANT
ELECT BLADE INSULATED 4IN (ELECTROSURGICAL) ×3
ELECT REM PT RETURN 9FT ADLT (ELECTROSURGICAL) ×3
ELECTRODE BLADE INSULATED 4IN (ELECTROSURGICAL) ×1 IMPLANT
ELECTRODE REM PT RTRN 9FT ADLT (ELECTROSURGICAL) ×1 IMPLANT
GAUZE 4X4 16PLY ~~LOC~~+RFID DBL (SPONGE) ×2 IMPLANT
GAUZE SPONGE 4X4 12PLY STRL (GAUZE/BANDAGES/DRESSINGS) ×1 IMPLANT
GLOVE SRG 8 PF TXTR STRL LF DI (GLOVE) ×1 IMPLANT
GLOVE SURG LTX SZ8 (GLOVE) ×3 IMPLANT
GLOVE SURG UNDER POLY LF SZ7 (GLOVE) ×6 IMPLANT
GLOVE SURG UNDER POLY LF SZ8 (GLOVE) ×3
GOWN STRL REUS W/ TWL LRG LVL3 (GOWN DISPOSABLE) IMPLANT
GOWN STRL REUS W/ TWL XL LVL3 (GOWN DISPOSABLE) ×1 IMPLANT
GOWN STRL REUS W/TWL 2XL LVL3 (GOWN DISPOSABLE) IMPLANT
GOWN STRL REUS W/TWL LRG LVL3 (GOWN DISPOSABLE) ×6
GOWN STRL REUS W/TWL XL LVL3 (GOWN DISPOSABLE) ×3
HEMOSTAT POWDER KIT SURGIFOAM (HEMOSTASIS) ×2 IMPLANT
KIT BASIN OR (CUSTOM PROCEDURE TRAY) ×3 IMPLANT
KIT POSITION SURG JACKSON T1 (MISCELLANEOUS) ×3 IMPLANT
KIT TURNOVER KIT B (KITS) ×3 IMPLANT
MARKER SKIN DUAL TIP RULER LAB (MISCELLANEOUS) ×3 IMPLANT
NDL HYPO 25X1 1.5 SAFETY (NEEDLE) ×1 IMPLANT
NEEDLE HYPO 25X1 1.5 SAFETY (NEEDLE) ×3 IMPLANT
NS IRRIG 1000ML POUR BTL (IV SOLUTION) ×3 IMPLANT
OIL CARTRIDGE MAESTRO DRILL (MISCELLANEOUS)
PACK LAMINECTOMY NEURO (CUSTOM PROCEDURE TRAY) ×3 IMPLANT
PAD ARMBOARD 7.5X6 YLW CONV (MISCELLANEOUS) ×9 IMPLANT
PATTIES SURGICAL .5 X.5 (GAUZE/BANDAGES/DRESSINGS) IMPLANT
PATTIES SURGICAL .5 X1 (DISPOSABLE) IMPLANT
PATTIES SURGICAL 1X1 (DISPOSABLE) IMPLANT
SPONGE SURGIFOAM ABS GEL 12-7 (HEMOSTASIS) ×3 IMPLANT
SPONGE T-LAP 4X18 ~~LOC~~+RFID (SPONGE) ×2 IMPLANT
SUT VIC AB 0 CT1 27 (SUTURE) ×3
SUT VIC AB 0 CT1 27XBRD ANBCTR (SUTURE) ×1 IMPLANT
SUT VIC AB 2-0 CP2 18 (SUTURE) ×3 IMPLANT
SUT VIC AB 3-0 SH 8-18 (SUTURE) IMPLANT
TOWEL GREEN STERILE (TOWEL DISPOSABLE) ×2 IMPLANT
TOWEL GREEN STERILE FF (TOWEL DISPOSABLE) ×2 IMPLANT
WATER STERILE IRR 1000ML POUR (IV SOLUTION) ×3 IMPLANT

## 2021-10-13 NOTE — Anesthesia Procedure Notes (Signed)
Procedure Name: Intubation Date/Time: 10/13/2021 8:57 AM Performed by: Myna Bright, CRNA Pre-anesthesia Checklist: Patient identified, Emergency Drugs available, Suction available and Patient being monitored Patient Re-evaluated:Patient Re-evaluated prior to induction Oxygen Delivery Method: Circle system utilized Preoxygenation: Pre-oxygenation with 100% oxygen Induction Type: IV induction Ventilation: Mask ventilation without difficulty Laryngoscope Size: Mac and 3 Grade View: Grade I Tube type: Oral Tube size: 7.5 mm Number of attempts: 1 Airway Equipment and Method: Stylet Placement Confirmation: ETT inserted through vocal cords under direct vision, positive ETCO2 and breath sounds checked- equal and bilateral Secured at: 21 cm Tube secured with: Tape Dental Injury: Teeth and Oropharynx as per pre-operative assessment

## 2021-10-13 NOTE — Anesthesia Postprocedure Evaluation (Signed)
Anesthesia Post Note  Patient: Mary Mullins  Procedure(s) Performed: OPEN LAMINECTOMY, LUMBAR TWO-THREE, LUMBAR  THREE-FOUR (Spine Lumbar)     Patient location during evaluation: PACU Anesthesia Type: General Level of consciousness: awake and alert Pain management: pain level controlled Vital Signs Assessment: post-procedure vital signs reviewed and stable Respiratory status: spontaneous breathing, nonlabored ventilation, respiratory function stable and patient connected to nasal cannula oxygen Cardiovascular status: blood pressure returned to baseline and stable Postop Assessment: no apparent nausea or vomiting Anesthetic complications: no   No notable events documented.  Last Vitals:  Vitals:   10/13/21 1210 10/13/21 1225  BP: (!) 159/80 (!) 167/68  Pulse: 71 73  Resp: 20 18  Temp:  36.6 C  SpO2: 96% 98%    Last Pain:  Vitals:   10/13/21 1210  TempSrc:   PainSc: 3                  Jhovanny Guinta

## 2021-10-13 NOTE — Transfer of Care (Signed)
Immediate Anesthesia Transfer of Care Note  Patient: MALKIE WILLE  Procedure(s) Performed: OPEN LAMINECTOMY, LUMBAR TWO-THREE, LUMBAR  THREE-FOUR (Spine Lumbar)  Patient Location: PACU  Anesthesia Type:General  Level of Consciousness: sedated and patient cooperative  Airway & Oxygen Therapy: Patient Spontanous Breathing and Patient connected to face mask oxygen  Post-op Assessment: Report given to RN, Post -op Vital signs reviewed and stable and Patient moving all extremities  Post vital signs: Reviewed and stable  Last Vitals:  Vitals Value Taken Time  BP 154/75 10/13/21 1126  Temp    Pulse 71 10/13/21 1128  Resp 19 10/13/21 1128  SpO2 99 % 10/13/21 1128  Vitals shown include unvalidated device data.  Last Pain:  Vitals:   10/13/21 0717  TempSrc:   PainSc: 1       Patients Stated Pain Goal: 0 (18/34/37 3578)  Complications: No notable events documented.

## 2021-10-13 NOTE — Op Note (Signed)
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 Pieter Partridge C. Kearie Mennen, DO   ASSISTANT: Dr. Consuella Lose, MD   ANESTHESIA: General Endotracheal   EBL: 100 cc   SPECIMENS: None   DRAINS: None   COMPLICATIONS: None   CONDITION: Hemodynamically stable   HISTORY: Mary Mullins is a 75 y.o. female that presented to the outpatient setting with complaints of low back pain radiating pain into her buttock anytime she stood or walked for period of a few minutes.  She had significant relief whenever she leaned over side down.  This was severe and altering her lifestyle, her pain was 9/10.  She attempted pain control without significant relief.  MRI showed severe stenosis at L2-3 and L3-4 due to ligamentum mitral hypertrophy and bilateral facet hypertrophy with clumping of the nerve roots.  Given this I discussed with her continued conservative measures or surgical intervention in the form of a laminectomy decompression at L2-3 and L3-4.  We discussed all risks, benefits and expected outcomes as well as alternatives to treatment.  She agreed with surgical intervention and informed consent was obtained.   PROCEDURE IN DETAIL: The patient was brought to the operating room. After induction of general anesthesia, the patient was positioned on the operative table in the prone position. All pressure points were meticulously padded. Skin incision was then marked out and prepped and draped in the usual sterile fashion.   Using a 10 blade, midline incision was created over the L2, L3, L4 spinous processes.  Using Bovie electrocautery soft tissue dissection was performed down to the  lumbodorsal fascia.  Subperiosteal dissection was performed bilaterally with Bovie electrocautery exposing the L2, L3, L4 lamina bilaterally.  Deep retractors placed in the wound.  Lateral fluoroscopy confirmed the appropriate level.  The microscope was sterilely draped and brought into the field.  Using Leksell rongeur, the spinous process of L2, L3, L4 were removed down to the lamina.  Using a high-speed drill, the L2 lamina was removed bilaterally to the lateral recess down to the ligamentum flavum and superiorly up to the ligamentous attachment bilaterally.  Using high-speed drill, a partial bilateral facetectomy was performed down to the ligamentum flavum.  Using a high-speed drill, the superior portion of the L3 lamina was removed down to the epidural space.  The epidural space was gently dissected with microcurette's.  The ligamentum flavum was then gently dissected from the epidural space and resected with a series of Kerrison rongeurs to the lateral recess bilaterally.  Using a ball-tipped probe, bilateral lateral recesses appeared decompressed.  The bilateral lateral recess were explored again with a ball-tipped probe and noted to be appropriately decompressed.  The thecal sac was pulsatile.  Bilateral foramen were also palpated with a ball-tipped probe and noted to be adequately decompressed.     Using a high-speed drill, the L3 lamina was removed bilaterally to the lateral recess down to the ligamentum flavum and superiorly up to the ligamentous attachment bilaterally.  Using high-speed drill, a partial bilateral facetectomy was performed down to the ligamentum flavum.  Using a high-speed drill, the superior portion of the L4 lamina was removed down to the epidural space.  The epidural space was gently dissected with microcurette's.  The ligamentum flavum was then gently dissected from the epidural space and resected with a series of Kerrison rongeurs to the lateral recess bilaterally.  Using a  ball-tipped probe, bilateral lateral recesses appeared decompressed.  The bilateral lateral recess were explored again with a ball-tipped probe and noted to be appropriately decompressed.  The thecal sac was pulsatile.  Bilateral foramen were also palpated with a ball-tipped probe and noted to be adequately decompressed.  There was significant adherence of the ligamentum flavum bilaterally at this level to the dura as well as calcification of the ligamentum flavum.  A mixture of Depo-Medrol and fentanyl was placed in the epidural space with Avitene. Deep retractor was taken out of the wound.  Hemostasis was achieved with bipolar cautery and the soft tissues.  The wound was closed in layers with 0 Vicryl sutures for muscle and fascia.  Dermis was closed with 2-0 and 3-0 Vicryl sutures.  Skin was closed with skin glue.  Sterile dressing was applied.   At the end of the case all sponge, needle, and instrument counts were correct. The patient was then transferred to the stretcher, extubated, and taken to the post-anesthesia care unit in stable hemodynamic condition.

## 2021-10-13 NOTE — H&P (Signed)
° ° °  Providing Compassionate, Quality Care - Together  NEUROSURGERY HISTORY & PHYSICAL   Mary Mullins is an 75 y.o. female.   Chief Complaint: Bilateral low back and buttock pain HPI: This is a 75 year old female with complaints of severe bilateral low back pain and radiating pain primarily into her left hip and left buttock after standing or walking for any amount of time.  Her MRI revealed severe stenosis at L2-3 and L3-4.  Despite pain medication she could not have any improvement and therefore presents today for surgical decompression in the form of an L2-3, L3-4 laminectomy.  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    Pulmonary nodule    8 mm pleural based LLL nodule, stabel 05/13/21 CT ([facility])    Past Surgical History:  Procedure Laterality Date   ANKLE FRACTURE SURGERY     L ankle   CATARACT EXTRACTION W/ INTRAOCULAR LENS IMPLANT Bilateral    ENTEROCELE REPAIR  08/21/2021   EYE SURGERY     TONSILLECTOMY     TRIGGER FINGER RELEASE Right 02/17/2015   Procedure:  RIGHT LONG FINGER AND RING FINGER TRIGGER RELEASE;  Surgeon: Milly Jakob, MD;  Location: [facility];  Service: Orthopedics;  Laterality: Right;   WRIST FRACTURE SURGERY     R wrist   WRIST SURGERY Right 10/25/2005    Family History  Problem Relation Age of Onset   Heart Problems Mother    Cirrhosis Father    Social History:  reports that she has never smoked. She has never used smokeless tobacco. She reports current alcohol use. She reports that she does not use drugs.  Allergies: No Known Allergies  Medications Prior to Admission  Medication Sig Dispense Refill   acetaminophen (TYLENOL) 325 MG tablet Take 650 mg by mouth every 6 (six) hours as needed for moderate pain.     amLODipine (NORVASC) 5 MG tablet Take 5  mg by mouth daily.     rosuvastatin (CRESTOR) 10 MG tablet Take 10 mg by mouth daily.     HYDROcodone-acetaminophen (NORCO) 5-325 MG tablet Take 1-2 tablets by mouth every 4 (four) hours as needed for severe pain. (Patient not taking: Reported on 10/07/2021) 15 tablet 0    No results found for this or any previous visit (from the past 48 hour(s)). No results found.  ROS All positives and negatives are listed in HPI above  Blood pressure (!) 189/88, pulse 79, temperature 98.2 F (36.8 C), temperature source Oral, resp. rate 17, height 5\' 9"  (1.753 m), weight 93 kg, SpO2 99 %. Physical Exam  Awake alert oriented x3 PERRLA EOMI Cranial nerves II through XII intact Bilateral upper extremity 5/5 Bilateral lower extremity 4+/5  Assessment/Plan 75 year old female with  L2-3, L3-4 severe lumbar stenosis with neurogenic claudication  -OR today for open laminectomy L2-3, L3-4.  We discussed all risks, benefits and expected outcomes as well as alternatives to treatment.  She agrees to proceed with surgical intervention.  Informed consent was obtained.   Thank you for allowing me to participate in this patient's care.  Please do not hesitate to call with questions or concerns.   Elwin Sleight, [facility] Cell: [phone]

## 2021-10-14 ENCOUNTER — Encounter (HOSPITAL_COMMUNITY): Payer: Self-pay | Admitting: Neurological Surgery

## 2021-10-14 DIAGNOSIS — M48062 Spinal stenosis, lumbar region with neurogenic claudication: Secondary | ICD-10-CM | POA: Diagnosis not present

## 2021-10-14 MED ORDER — METHOCARBAMOL 500 MG PO TABS
500.0000 mg | ORAL_TABLET | Freq: Four times a day (QID) | ORAL | 1 refills | Status: DC | PRN
Start: 2021-10-14 — End: 2022-10-20

## 2021-10-14 MED ORDER — HYDROCODONE-ACETAMINOPHEN 7.5-325 MG PO TABS: 1.0000 | ORAL_TABLET | ORAL | 0 refills | Status: DC | PRN

## 2021-10-14 NOTE — Evaluation (Signed)
Physical Therapy Evaluation Patient Details Name: Mary Mullins MRN: [id number] DOB: 1946/01/12 Today's Date: 10/14/2021  History of Present Illness  Pt is a 75 y.o. female who underwent open bilateral L2-L4 laminectomy on 10/13/21. PMH significant for HTN, pulmonary nodule, neuromuscular disorder (R shoulder 80% use), L ankle fracture surgery, enterocele repair, R wrist fracture surgery.   Clinical Impression  Pt admitted with above diagnosis. At the time of PT eval, pt was able to demonstrate transfers and ambulation with gross min guard assist to supervision for safety. Pt was able to negotiate stairs however with very poor posture. Encouraged improved posture and core stabilization to encourage neutral spine. Pt was educated on precautions, positioning recommendations, appropriate activity progression, and car transfer. Pt currently with functional limitations due to the deficits listed below (see PT Problem List). Pt will benefit from skilled PT to increase their independence and safety with mobility to allow discharge to the venue listed below.         Recommendations for follow up therapy are one component of a multi-disciplinary discharge planning process, led by the attending physician.  Recommendations may be updated based on patient status, additional functional criteria and insurance authorization.  Follow Up Recommendations No PT follow up    Assistance Recommended at Discharge PRN  Functional Status Assessment Patient has had a recent decline in their functional status and demonstrates the ability to make significant improvements in function in a reasonable and predictable amount of time.  Equipment Recommendations  None recommended by PT    Recommendations for Other Services       Precautions / Restrictions Precautions Precautions: Back;Fall Precaution Booklet Issued: Yes (comment) Precaution Comments: Reviewed handout and pt was cued for precautions during functional  mobility. Restrictions Weight Bearing Restrictions: No      Mobility  Bed Mobility Overal bed mobility: Modified Independent Bed Mobility: Rolling;Sidelying to Sit;Sit to Sidelying Rolling: Supervision Sidelying to sit: Supervision       General bed mobility comments: HOB flat and rails lowered to simulate home environment.    Transfers Overall transfer level: Needs assistance Equipment used: None Transfers: Sit to/from Stand Sit to Stand: Supervision           General transfer comment: Light supervision for safety. No assist required.    Ambulation/Gait Ambulation/Gait assistance: Supervision Gait Distance (Feet): 300 Feet Assistive device: None Gait Pattern/deviations: Step-through pattern;Decreased stride length;Trunk flexed Gait velocity: Decreased Gait velocity interpretation: 1.31 - 2.62 ft/sec, indicative of limited community ambulator   General Gait Details: VC's for improved posture, scapular retraction and cervical retraction to neutral. Pt with very flexed spine at baseline 2 reported pain. Fatigues quickly with improvement in spinal alignment.  Stairs Stairs: Yes Stairs assistance: Min guard Stair Management: One rail Right;Step to pattern;Forwards Number of Stairs: 12 General stair comments: VC's for sequencing and general safety. No assist required however close guard provided for safety.  Wheelchair Mobility    Modified Rankin (Stroke Patients Only)       Balance Overall balance assessment: Needs assistance Sitting-balance support: Feet supported Sitting balance-Leahy Scale: Good     Standing balance support: No upper extremity supported;During functional activity Standing balance-Leahy Scale: Fair                               Pertinent Vitals/Pain Pain Assessment: Faces Faces Pain Scale: Hurts a little bit Pain Location: Incision site Pain Descriptors / Indicators: Discomfort;Grimacing Pain Intervention(s): Limited  activity  within patient's tolerance;Monitored during session;Repositioned    Home Living Family/patient expects to be discharged to:: Private residence Living Arrangements: Spouse/significant other Available Help at Discharge: Family;Available 24 hours/day Type of Home: House Home Access: Level entry     Alternate Level Stairs-Number of Steps: flight, circle stair case Home Layout: Two level ("office" upstairs, pt working on a bibliography) Home Equipment: None      Prior Function Prior Level of Function : Working/employed;Independent/Modified Independent;Driving             Mobility Comments: no AD ADLs Comments: indep     Hand Dominance        Extremity/Trunk Assessment   Upper Extremity Assessment Upper Extremity Assessment: Defer to OT evaluation    Lower Extremity Assessment Lower Extremity Assessment: Generalized weakness (Consistent with pre-op diagnosis)    Cervical / Trunk Assessment Cervical / Trunk Assessment: Back Surgery  Communication   Communication: No difficulties  Cognition Arousal/Alertness: Awake/alert Behavior During Therapy: WFL for tasks assessed/performed Overall Cognitive Status: Within Functional Limits for tasks assessed                                 General Comments: good understanding of back precautions, can be a little impulsive with movement.        General Comments General comments (skin integrity, edema, etc.): VSS on RA    Exercises     Assessment/Plan    PT Assessment Patient needs continued PT services  PT Problem List Decreased strength;Decreased activity tolerance;Decreased balance;Decreased mobility;Decreased knowledge of use of DME;Decreased safety awareness;Decreased knowledge of precautions;Pain       PT Treatment Interventions DME instruction;Gait training;Functional mobility training;Stair training;Therapeutic activities;Therapeutic exercise;Neuromuscular re-education;Patient/family  education    PT Goals (Current goals can be found in the Care Plan section)  Acute Rehab PT Goals Patient Stated Goal: Home today, use her eliptical PT Goal Formulation: With patient Time For Goal Achievement: 10/21/21 Potential to Achieve Goals: Good    Frequency Min 5X/week   Barriers to discharge        Co-evaluation               AM-PAC PT "6 Clicks" Mobility  Outcome Measure Help needed turning from your back to your side while in a flat bed without using bedrails?: None Help needed moving from lying on your back to sitting on the side of a flat bed without using bedrails?: None Help needed moving to and from a bed to a chair (including a wheelchair)?: A Little Help needed standing up from a chair using your arms (e.g., wheelchair or bedside chair)?: A Little Help needed to walk in hospital room?: A Little Help needed climbing 3-5 steps with a railing? : A Little 6 Click Score: 20    End of Session Equipment Utilized During Treatment: Gait belt Activity Tolerance: Patient tolerated treatment well Patient left: in bed;with call bell/phone within reach Nurse Communication: Mobility status PT Visit Diagnosis: Unsteadiness on feet (R26.81);Pain Pain - part of body:  (back)    Time: 4081-4481 PT Time Calculation (min) (ACUTE ONLY): 21 min   Charges:   PT Evaluation $PT Eval Low Complexity: 1 Low          Rolinda Roan, PT, DPT Acute Rehabilitation Services Pager: [phone] Office: [phone]   Thelma Comp 10/14/2021, 9:32 AM

## 2021-10-14 NOTE — Discharge Instructions (Signed)
Wound Care Leave incision open to air. You may shower. Do not scrub directly on incision.  Do not put any creams, lotions, or ointments on incision. Activity Walk each and every day, increasing distance each day. No lifting greater than 5 lbs.  Avoid bending, arching, and twisting. No driving for 2 weeks; may ride as a passenger locally.  Diet Resume your normal diet.  Return to Work Will be discussed at you follow up appointment. Call Your Doctor If Any of These Occur Redness, drainage, or swelling at the wound.  Temperature greater than 101 degrees. Severe pain not relieved by pain medication. Incision starts to come apart. Follow Up Appt Call  [phone])  for problems.

## 2021-10-14 NOTE — Discharge Summary (Signed)
°  Physician Discharge Summary  Patient ID: Mary Mullins MRN: [id number] DOB/AGE: 1946-04-07 75 y.o.  Admit date: 10/13/2021 Discharge date: 10/14/2021  Admission Diagnoses:  Lumbar stenosis L2-3, L3-4 with severe pain and neurogenic claudication  Discharge Diagnoses:  Same Principal Problem:   Lumbar stenosis with neurogenic claudication   Discharged Condition: Stable  Hospital Course:  Mary Mullins is a 75 y.o. female that presented for an elective L2-3, L3-4 open laminectomy for neurogenic claudication.  She tolerated the surgery well.  Postoperatively her ability to ambulate was significantly improved.  Her incision was clean dry and intact.  She was having normal bowel bladder function and ambulating independently.  Her pain was controlled on oral medication and she was tolerating a normal diet.  Treatments: Surgery -open L2-3, L3-4 laminectomy  Discharge Exam: Blood pressure 122/73, pulse 77, temperature 98.6 F (37 C), temperature source Oral, resp. rate 16, height 5\' 9"  (1.753 m), weight 93 kg, SpO2 96 %. Awake, alert, oriented x3 PERRLA Speech fluent, appropriate CN grossly intact 5/5 BUE/BLE Wound c/d/i  Disposition: Discharge disposition: 01-Home or Self Care       Discharge Instructions     Incentive spirometry RT   Complete by: As directed       Allergies as of 10/14/2021   No Known Allergies      Medication List     STOP taking these medications    HYDROcodone-acetaminophen 5-325 MG tablet Commonly known as: Norco Replaced by: HYDROcodone-acetaminophen 7.5-325 MG tablet       TAKE these medications    acetaminophen 325 MG tablet Commonly known as: TYLENOL Take 650 mg by mouth every 6 (six) hours as needed for moderate pain.   amLODipine 5 MG tablet Commonly known as: NORVASC Take 5 mg by mouth daily.   HYDROcodone-acetaminophen 7.5-325 MG tablet Commonly known as: NORCO Take 1 tablet by mouth every 4 (four) hours  as needed for moderate pain ((score 4 to 6)). Replaces: HYDROcodone-acetaminophen 5-325 MG tablet   methocarbamol 500 MG tablet Commonly known as: ROBAXIN Take 1 tablet (500 mg total) by mouth every 6 (six) hours as needed for muscle spasms.   rosuvastatin 10 MG tablet Commonly known as: CRESTOR Take 10 mg by mouth daily.        Follow-up Information     Kandas Oliveto C, DO Follow up in 3 week(s).   Contact information: [address] [place] [address] [phone]                 Signed: Theodoro Doing Mary Mullins 10/14/2021, 7:52 AM

## 2021-10-14 NOTE — Progress Notes (Signed)
Patient alert and oriented, mae's well, voiding adequate amount of urine, swallowing without difficulty, no c/o pain at time of discharge. Patient discharged home with family. Script and discharged instructions given to patient. Patient and family stated understanding of instructions given. Patient has an appointment with Dr Dawley in 2 weeks 

## 2021-10-14 NOTE — Evaluation (Signed)
Occupational Therapy Evaluation Patient Details Name: Mary Mullins MRN: [id number] DOB: January 22, 1946 Today's Date: 10/14/2021   History of Present Illness Mary Mullins is a 75 y.o. female who underwent open bilateral L2, L3, L4 laminectomy 12/20. PMHx: dyslipidemia, HTN   Clinical Impression   Mary Mullins was indep PTA. She lives in a 2 level home, 0 STE, with her bed and bath on the main level and her office on the 2nd level. Pt verbalized great understanding of her back precautions but benefited from verbal cues to maintain during ADLs and functional mobility. Educated on compensatory techniques, and she verbalized great understanding. Overall pt was min guard for functional mobility within the room, and up to min A for ADLs. She does not need further acute OT. Recommend d/c home with supervision initially for ADLs and mobility.      Recommendations for follow up therapy are one component of a multi-disciplinary discharge planning process, led by the attending physician.  Recommendations may be updated based on patient status, additional functional criteria and insurance authorization.   Follow Up Recommendations  No OT follow up    Assistance Recommended at Discharge Intermittent Supervision/Assistance  Functional Status Assessment  Patient has had a recent decline in their functional status and demonstrates the ability to make significant improvements in function in a reasonable and predictable amount of time.  Equipment Recommendations  None recommended by OT       Precautions / Restrictions Precautions Precautions: Back;Fall Precaution Booklet Issued: Yes (comment) Precaution Comments: pt recalled 3/3 back precautions Restrictions Weight Bearing Restrictions: No      Mobility Bed Mobility Overal bed mobility: Needs Assistance Bed Mobility: Rolling;Sidelying to Sit Rolling: Supervision Sidelying to sit: Supervision       General bed mobility comments: verbal  cues for log roll    Transfers Overall transfer level: Needs assistance Equipment used: Rolling walker (2 wheels);None Transfers: Sit to/from Stand Sit to Stand: Min guard           General transfer comment: for safety. completed tansfers with and without AD, min guard for both.      Balance Overall balance assessment: Needs assistance Sitting-balance support: Feet supported Sitting balance-Leahy Scale: Good     Standing balance support: No upper extremity supported;During functional activity Standing balance-Leahy Scale: Fair               ADL either performed or assessed with clinical judgement   ADL Overall ADL's : Needs assistance/impaired Eating/Feeding: Independent;Sitting   Grooming: Supervision/safety;Cueing for compensatory techniques;Standing Grooming Details (indicate cue type and reason): two cups at the sink for oral hygiene Upper Body Bathing: Supervision/ safety;Sitting;Cueing for compensatory techniques   Lower Body Bathing: Minimal assistance;Cueing for compensatory techniques;Sit to/from stand   Upper Body Dressing : Supervision/safety;Sitting   Lower Body Dressing: Minimal assistance;Sit to/from stand Lower Body Dressing Details (indicate cue type and reason): verbal cues for compensatory techniques Toilet Transfer: Min guard;Ambulation   Toileting- Clothing Manipulation and Hygiene: Supervision/safety;Sitting/lateral lean       Functional mobility during ADLs: Min guard;Rolling walker (2 wheels) General ADL Comments: verbal cues for back precautions and compensatory techniques. Pt slightly impulsive with movement adn benefits from cues. Pt minguard with and wtihout RW.     Vision Baseline Vision/History: 0 No visual deficits Ability to See in Adequate Light: 0 Adequate Patient Visual Report: No change from baseline Vision Assessment?: No apparent visual deficits            Pertinent Vitals/Pain Pain Assessment: Faces  Faces Pain  Scale: Hurts a little bit Pain Location: sx site Pain Descriptors / Indicators: Discomfort;Grimacing Pain Intervention(s): Limited activity within patient's tolerance;Monitored during session     Hand Dominance     Extremity/Trunk Assessment Upper Extremity Assessment Upper Extremity Assessment: Overall WFL for tasks assessed       Cervical / Trunk Assessment Cervical / Trunk Assessment: Back Surgery   Communication     Cognition Arousal/Alertness: Awake/alert Behavior During Therapy: WFL for tasks assessed/performed Overall Cognitive Status: Within Functional Limits for tasks assessed               General Comments: good understanding of back precautions, can be a little impulsive with movement.     General Comments  VSS on RA     Home Living Family/patient expects to be discharged to:: Private residence Living Arrangements: Spouse/significant other Available Help at Discharge: Family;Available 24 hours/day Type of Home: House Home Access: Level entry     Home Layout: Two level ("office" upstairs, pt working on a bibliography) Alternate Level Stairs-Number of Steps: flight, circle stair case   Bathroom Shower/Tub: Occupational psychologist: Handicapped height     Home Equipment: None          Prior Functioning/Environment Prior Level of Function : Working/employed;Independent/Modified Independent;Driving             Mobility Comments: no AD ADLs Comments: indep        OT Problem List: Decreased strength;Decreased range of motion;Decreased activity tolerance;Impaired balance (sitting and/or standing);Decreased safety awareness;Decreased knowledge of use of DME or AE;Decreased knowledge of precautions;Pain      OT Treatment/Interventions:      OT Goals(Current goals can be found in the care plan section) Acute Rehab OT Goals Patient Stated Goal: home asap OT Goal Formulation: With patient Potential to Achieve Goals: Fair   AM-PAC  OT "6 Clicks" Daily Activity     Outcome Measure Help from another person eating meals?: None Help from another person taking care of personal grooming?: A Little Help from another person toileting, which includes using toliet, bedpan, or urinal?: A Little Help from another person bathing (including washing, rinsing, drying)?: A Little Help from another person to put on and taking off regular upper body clothing?: None Help from another person to put on and taking off regular lower body clothing?: A Little 6 Click Score: 20   End of Session Equipment Utilized During Treatment: Rolling walker (2 wheels);Gait belt Nurse Communication: Mobility status  Activity Tolerance: Patient tolerated treatment well Patient left: in bed;with call bell/phone within reach  OT Visit Diagnosis: Unsteadiness on feet (R26.81);Other abnormalities of gait and mobility (R26.89);Muscle weakness (generalized) (M62.81);Pain                Time: 6073-7106 OT Time Calculation (min): 21 min Charges:  OT General Charges $OT Visit: 1 Visit OT Evaluation $OT Eval Low Complexity: 1 Low  Cammie Faulstich A Joselinne Lawal 10/14/2021, 8:59 AM

## 2022-06-01 ENCOUNTER — Other Ambulatory Visit: Payer: Self-pay | Admitting: Neurological Surgery

## 2022-06-01 DIAGNOSIS — M4126 Other idiopathic scoliosis, lumbar region: Secondary | ICD-10-CM

## 2022-06-16 ENCOUNTER — Ambulatory Visit
Admission: RE | Admit: 2022-06-16 | Discharge: 2022-06-16 | Disposition: A | Discharge status: Home / Self Care | Payer: PPO | Source: Ambulatory Visit | Attending: Neurological Surgery | Admitting: Neurological Surgery

## 2022-06-16 DIAGNOSIS — M4126 Other idiopathic scoliosis, lumbar region: Secondary | ICD-10-CM

## 2022-09-29 ENCOUNTER — Other Ambulatory Visit: Payer: Self-pay | Admitting: Neurological Surgery

## 2022-10-05 ENCOUNTER — Other Ambulatory Visit: Payer: Self-pay | Admitting: Neurological Surgery

## 2022-10-11 NOTE — Pre-Procedure Instructions (Signed)
Surgical Instructions    Your procedure is scheduled on Tuesday, December 26.  Report to [facility] Main Entrance "A" at 5:30 A.M., then check in with the Admitting office.  Call this number if you have problems the morning of surgery:  [phone]   If you have any questions prior to your surgery date call [phone]: Open Monday-Friday 8am-4pm If you experience any cold or flu symptoms such as cough, fever, chills, shortness of breath, etc. between now and your scheduled surgery, please notify us at the above number     Remember:  Do not eat or drink after midnight the night before your surgery    Take these medicines the morning of surgery with A SIP OF WATER: Amlodipine (Norvasc)   As of today, STOP taking any Aspirin (unless otherwise instructed by your surgeon) Aleve, Naproxen, Ibuprofen, Motrin, Advil, Goody's, BC's, all herbal medications, fish oil, and all vitamins.            [facility] is not responsible for any belongings or valuables.    Do NOT Smoke (Tobacco/Vaping)  24 hours prior to your procedure  If you use a CPAP at night, you may bring your mask for your overnight stay.   Contacts, glasses, hearing aids, dentures or partials may not be worn into surgery, please bring cases for these belongings   For patients admitted to the hospital, discharge time will be determined by your treatment team.   Patients discharged the day of surgery will not be allowed to drive home, and someone needs to stay with them for 24 hours.   SURGICAL WAITING ROOM VISITATION Patients having surgery or a procedure may have no more than 2 support people in the waiting area - these visitors may rotate.   Children under the age of 48 must have an adult with them who is not the patient. If the patient needs to stay at the hospital during part of their recovery, the visitor guidelines for inpatient rooms apply. Pre-op nurse will coordinate an appropriate time for 1 support person to  accompany patient in pre-op.  This support person may not rotate.   Please refer to [email or web address][employer]hu for the visitor guidelines for Inpatients (after your surgery is over and you are in a regular room).    Special instructions:    Oral Hygiene is also important to reduce your risk of infection.  Remember - BRUSH YOUR TEETH THE MORNING OF SURGERY WITH YOUR REGULAR TOOTHPASTE   [facility]- Preparing For Surgery  Before surgery, you can play an important role. Because skin is not sterile, your skin needs to be as free of germs as possible. You can reduce the number of germs on your skin by washing with CHG (chlorahexidine gluconate) Soap before surgery.  CHG is an antiseptic cleaner which kills germs and bonds with the skin to continue killing germs even after washing.     Please do not use if you have an allergy to CHG or antibacterial soaps. If your skin becomes reddened/irritated stop using the CHG.  Do not shave (including legs and underarms) for at least 48 hours prior to first CHG shower. It is OK to shave your face.  Please follow these instructions carefully.     Shower the NIGHT BEFORE SURGERY and the MORNING OF SURGERY with CHG Soap.   If you chose to wash your hair, wash your hair first as usual with your normal shampoo. After you shampoo, rinse your hair and body thoroughly to remove the  shampoo.  Then [employer] and genitals (private parts) with your normal soap and rinse thoroughly to remove soap.  After that Use CHG Soap as you would any other liquid soap. You can apply CHG directly to the skin and wash gently with a scrungie or a clean washcloth.   Apply the CHG Soap to your body ONLY FROM THE NECK DOWN.  Do not use on open wounds or open sores. Avoid contact with your eyes, ears, mouth and genitals (private parts). Wash Face and genitals (private parts)  with your normal soap.   Wash thoroughly, paying special attention to  the area where your surgery will be performed.  Thoroughly rinse your body with warm water from the neck down.  DO NOT shower/wash with your normal soap after using and rinsing off the CHG Soap.  Pat yourself dry with a CLEAN TOWEL.  Wear CLEAN PAJAMAS to bed the night before surgery  Place CLEAN SHEETS on your bed the night before your surgery  DO NOT SLEEP WITH PETS.   Day of Surgery:  Take a shower with CHG soap. Wear Clean/Comfortable clothing the morning of surgery Do not wear jewelry or makeup. Do not wear lotions, powders, perfumes/cologne or deodorant. Do not shave 48 hours prior to surgery.  Men may shave face and neck. Do not bring valuables to the hospital. Do not wear nail polish, gel polish, artificial nails, or any other type of covering on natural nails (fingers and toes) If you have artificial nails or gel coating that need to be removed by a nail salon, please have this removed prior to surgery. Artificial nails or gel coating may interfere with anesthesia's ability to adequately monitor your vital signs. Remember to brush your teeth WITH YOUR REGULAR TOOTHPASTE.    If you received a COVID test during your pre-op visit, it is requested that you wear a mask when out in public, stay away from anyone that may not be feeling well, and notify your surgeon if you develop symptoms. If you have been in contact with anyone that has tested positive in the last 10 days, please notify your surgeon.    Please read over the following fact sheets that you were given.

## 2022-10-12 ENCOUNTER — Encounter (HOSPITAL_COMMUNITY): Payer: Self-pay

## 2022-10-12 ENCOUNTER — Encounter (HOSPITAL_COMMUNITY)
Admission: RE | Admit: 2022-10-12 | Discharge: 2022-10-12 | Disposition: A | Discharge status: Home / Self Care | Payer: PPO | Source: Ambulatory Visit | Attending: Neurological Surgery | Admitting: Neurological Surgery

## 2022-10-12 ENCOUNTER — Other Ambulatory Visit: Payer: Self-pay

## 2022-10-12 VITALS — BP 149/78 | HR 91 | Temp 97.7°F | Resp 18 | Ht 69.0 in | Wt 205.2 lb

## 2022-10-12 DIAGNOSIS — Z01818 Encounter for other preprocedural examination: Secondary | ICD-10-CM

## 2022-10-12 DIAGNOSIS — Z01812 Encounter for preprocedural laboratory examination: Secondary | ICD-10-CM | POA: Insufficient documentation

## 2022-10-12 LAB — CBC
HCT: 41.2 % (ref 36.0–46.0)
Hemoglobin: 13.1 g/dL (ref 12.0–15.0)
MCH: 29.5 pg (ref 26.0–34.0)
MCHC: 31.8 g/dL (ref 30.0–36.0)
MCV: 92.8 fL (ref 80.0–100.0)
Platelets: 253 10*3/uL (ref 150–400)
RBC: 4.44 MIL/uL (ref 3.87–5.11)
RDW: 12.5 % (ref 11.5–15.5)
WBC: 4.8 10*3/uL (ref 4.0–10.5)
nRBC: 0 % (ref 0.0–0.2)

## 2022-10-12 LAB — SURGICAL PCR SCREEN
MRSA, PCR: NEGATIVE
Staphylococcus aureus: NEGATIVE

## 2022-10-12 LAB — TYPE AND SCREEN
ABO/RH(D): A NEG
Antibody Screen: NEGATIVE

## 2022-10-12 LAB — BASIC METABOLIC PANEL
Anion gap: 5 (ref 5–15)
BUN: 10 mg/dL (ref 8–23)
CO2: 27 mmol/L (ref 22–32)
Calcium: 9.4 mg/dL (ref 8.9–10.3)
Chloride: 105 mmol/L (ref 98–111)
Creatinine, Ser: 0.97 mg/dL (ref 0.44–1.00)
GFR, Estimated: 60 mL/min — ABNORMAL LOW (ref >60)
Glucose, Bld: 101 mg/dL — ABNORMAL HIGH (ref 70–99)
Potassium: 4.2 mmol/L (ref 3.5–5.1)
Sodium: 137 mmol/L (ref 135–145)

## 2022-10-12 NOTE — Progress Notes (Signed)
PCP: Dr. Wynona Dove. Clearance note in Care Everywhere Cardiologist: denies  EKG: 09/27/22--requested tracing from PCP CXR: n/a ECHO: denies Stress Test: denies Cardiac Cath: denies  ERAS: NPO  Covid: N/A  Patient denies shortness of breath, fever, cough, and chest pain at PAT appointment.  Patient verbalized understanding of instructions provided today at the PAT appointment.  Patient asked to review instructions at home and day of surgery.

## 2022-10-13 NOTE — Progress Notes (Signed)
Anesthesia Chart Review:  Pt seen by PCP Dr. Rayann Heman 09/27/22 for preop eval. Hx of HTN, HLD, chronic RBBB, chronic stable LLL nodule. Chronic conditions stable. Cleared for surgery.  Preop labs reviewed, unremarkable.  EKG 09/27/22 (copy on chart): NSR. Rate 73. RBBB.  CT Chest 05/31/22 (care everywhere): CONCLUSION:   Unchanged left lower lobe pulmonary nodule, now with greater than two-year stability. No specific additional follow-up is required    Wynonia Musty [facility] Short Stay Center/Anesthesiology Phone [phone] 10/13/2022 1:07 PM

## 2022-10-13 NOTE — Anesthesia Preprocedure Evaluation (Addendum)
Anesthesia Evaluation  Patient identified by MRN, date of birth, ID band Patient awake    Reviewed: Allergy & Precautions, NPO status , Patient's Chart, lab work & pertinent test results  History of Anesthesia Complications (+) PROLONGED EMERGENCE and history of anesthetic complications  Airway Mallampati: II  TM Distance: >3 FB Neck ROM: Full    Dental  (+) Dental Advisory Given   Pulmonary neg pulmonary ROS   breath sounds clear to auscultation       Cardiovascular hypertension, Pt. on medications  Rhythm:Regular Rate:Normal     Neuro/Psych    GI/Hepatic negative GI ROS, Neg liver ROS,,,  Endo/Other  negative endocrine ROS    Renal/GU negative Renal ROS     Musculoskeletal   Abdominal   Peds  Hematology negative hematology ROS (+)   Anesthesia Other Findings   Reproductive/Obstetrics                             Anesthesia Physical Anesthesia Plan  ASA: 2  Anesthesia Plan: General   Post-op Pain Management: Tylenol PO (pre-op)*   Induction: Intravenous  PONV Risk Score and Plan: 3 and Dexamethasone, Ondansetron and Treatment may vary due to age or medical condition  Airway Management Planned: Oral ETT  Additional Equipment:   Intra-op Plan:   Post-operative Plan: Extubation in OR  Informed Consent: I have reviewed the patients History and Physical, chart, labs and discussed the procedure including the risks, benefits and alternatives for the proposed anesthesia with the patient or authorized representative who has indicated his/her understanding and acceptance.     Dental advisory given  Plan Discussed with:   Anesthesia Plan Comments: (PAT note by Karoline Caldwell, PA-C: Pt seen by PCP Dr. Rayann Heman 09/27/22 for preop eval. Hx of HTN, HLD, chronic RBBB, chronic stable LLL nodule. Chronic conditions stable. Cleared for surgery.  Preop labs reviewed, unremarkable.  EKG  09/27/22 (copy on chart): NSR. Rate 73. RBBB.  CT Chest 05/31/22 (care everywhere): CONCLUSION:   Unchanged left lower lobe pulmonarynodule, now with greater than two-year stability. No specific additional follow-up is required   )        Anesthesia Quick Evaluation

## 2022-10-19 ENCOUNTER — Ambulatory Visit (HOSPITAL_BASED_OUTPATIENT_CLINIC_OR_DEPARTMENT_OTHER): Payer: PPO | Admitting: Physician Assistant

## 2022-10-19 ENCOUNTER — Ambulatory Visit (HOSPITAL_COMMUNITY): Payer: PPO | Admitting: Physician Assistant

## 2022-10-19 ENCOUNTER — Encounter (HOSPITAL_COMMUNITY): Payer: Self-pay | Admitting: Neurological Surgery

## 2022-10-19 ENCOUNTER — Encounter (HOSPITAL_COMMUNITY)
Admission: RE | Disposition: A | Discharge status: Home / Self Care | Payer: Self-pay | Source: Home / Self Care | Attending: Neurological Surgery

## 2022-10-19 ENCOUNTER — Other Ambulatory Visit: Payer: Self-pay

## 2022-10-19 ENCOUNTER — Ambulatory Visit (HOSPITAL_COMMUNITY): Payer: PPO

## 2022-10-19 ENCOUNTER — Observation Stay (HOSPITAL_COMMUNITY)
Admission: RE | Admit: 2022-10-19 | Discharge: 2022-10-20 | Disposition: A | Discharge status: Home / Self Care | Payer: PPO | Attending: Neurological Surgery | Admitting: Neurological Surgery

## 2022-10-19 DIAGNOSIS — M4726 Other spondylosis with radiculopathy, lumbar region: Secondary | ICD-10-CM | POA: Insufficient documentation

## 2022-10-19 DIAGNOSIS — I10 Essential (primary) hypertension: Secondary | ICD-10-CM | POA: Insufficient documentation

## 2022-10-19 DIAGNOSIS — M48062 Spinal stenosis, lumbar region with neurogenic claudication: Secondary | ICD-10-CM | POA: Diagnosis not present

## 2022-10-19 DIAGNOSIS — M47816 Spondylosis without myelopathy or radiculopathy, lumbar region: Secondary | ICD-10-CM | POA: Diagnosis not present

## 2022-10-19 DIAGNOSIS — M48061 Spinal stenosis, lumbar region without neurogenic claudication: Secondary | ICD-10-CM | POA: Diagnosis present

## 2022-10-19 DIAGNOSIS — Z79899 Other long term (current) drug therapy: Secondary | ICD-10-CM | POA: Diagnosis not present

## 2022-10-19 DIAGNOSIS — M7138 Other bursal cyst, other site: Secondary | ICD-10-CM | POA: Diagnosis not present

## 2022-10-19 HISTORY — PX: BACK SURGERY: SHX140

## 2022-10-19 LAB — ABO/RH: ABO/RH(D): A NEG

## 2022-10-19 SURGERY — POSTERIOR LUMBAR FUSION 2 LEVEL
Anesthesia: General

## 2022-10-19 MED ORDER — ACETAMINOPHEN 650 MG RE SUPP: 650.0000 mg | RECTAL | Status: DC | PRN

## 2022-10-19 MED ORDER — FENTANYL CITRATE (PF) 100 MCG/2ML IJ SOLN
INTRAMUSCULAR | Status: AC
  Filled 2022-10-19: qty 2

## 2022-10-19 MED ORDER — LIDOCAINE 2% (20 MG/ML) 5 ML SYRINGE
INTRAMUSCULAR | Status: AC
  Filled 2022-10-19: qty 5

## 2022-10-19 MED ORDER — THROMBIN 20000 UNITS EX SOLR
CUTANEOUS | Status: AC
  Filled 2022-10-19: qty 20000

## 2022-10-19 MED ORDER — CHLORHEXIDINE GLUCONATE 0.12 % MT SOLN: 15.0000 mL | Freq: Once | OROMUCOSAL | Status: AC

## 2022-10-19 MED ORDER — PHENOL 1.4 % MT LIQD: 1.0000 | OROMUCOSAL | Status: DC | PRN

## 2022-10-19 MED ORDER — DOCUSATE SODIUM 100 MG PO CAPS
100.0000 mg | ORAL_CAPSULE | Freq: Two times a day (BID) | ORAL | Status: DC
  Administered 2022-10-19: 100 mg via ORAL
  Filled 2022-10-19: qty 1

## 2022-10-19 MED ORDER — SODIUM CHLORIDE 0.9% FLUSH: 3.0000 mL | INTRAVENOUS | Status: DC | PRN

## 2022-10-19 MED ORDER — BUPIVACAINE LIPOSOME 1.3 % IJ SUSP
INTRAMUSCULAR | Status: AC
  Filled 2022-10-19: qty 20

## 2022-10-19 MED ORDER — BUPIVACAINE-EPINEPHRINE (PF) 0.5% -1:200000 IJ SOLN
INTRAMUSCULAR | Status: AC
  Filled 2022-10-19: qty 30

## 2022-10-19 MED ORDER — THROMBIN 5000 UNITS EX SOLR
CUTANEOUS | Status: AC
  Filled 2022-10-19: qty 5000

## 2022-10-19 MED ORDER — ACETAMINOPHEN 500 MG PO TABS: 1000.0000 mg | ORAL_TABLET | Freq: Once | ORAL | Status: AC

## 2022-10-19 MED ORDER — ORAL CARE MOUTH RINSE: 15.0000 mL | Freq: Once | OROMUCOSAL | Status: AC

## 2022-10-19 MED ORDER — ROCURONIUM BROMIDE 10 MG/ML (PF) SYRINGE
PREFILLED_SYRINGE | INTRAVENOUS | Status: DC | PRN
  Administered 2022-10-19: 30 mg via INTRAVENOUS
  Administered 2022-10-19: 40 mg via INTRAVENOUS
  Administered 2022-10-19: 50 mg via INTRAVENOUS

## 2022-10-19 MED ORDER — ONDANSETRON HCL 4 MG/2ML IJ SOLN
INTRAMUSCULAR | Status: AC
  Filled 2022-10-19: qty 2

## 2022-10-19 MED ORDER — SENNOSIDES-DOCUSATE SODIUM 8.6-50 MG PO TABS
1.0000 | ORAL_TABLET | Freq: Two times a day (BID) | ORAL | Status: DC
  Administered 2022-10-19: 1 via ORAL
  Filled 2022-10-19: qty 1

## 2022-10-19 MED ORDER — THROMBIN 20000 UNITS EX SOLR
CUTANEOUS | Status: DC | PRN
  Administered 2022-10-19: 20 mL via TOPICAL

## 2022-10-19 MED ORDER — CEFAZOLIN SODIUM-DEXTROSE 2-3 GM-%(50ML) IV SOLR
INTRAVENOUS | Status: DC | PRN
  Administered 2022-10-19: 2 g via INTRAVENOUS

## 2022-10-19 MED ORDER — CHLORHEXIDINE GLUCONATE CLOTH 2 % EX PADS: 6.0000 | MEDICATED_PAD | Freq: Once | CUTANEOUS | Status: DC

## 2022-10-19 MED ORDER — OXYCODONE HCL 5 MG PO TABS: 10.0000 mg | ORAL_TABLET | ORAL | Status: DC | PRN

## 2022-10-19 MED ORDER — FENTANYL CITRATE (PF) 100 MCG/2ML IJ SOLN
25.0000 ug | INTRAMUSCULAR | Status: DC | PRN
  Administered 2022-10-19 (×2): 50 ug via INTRAVENOUS

## 2022-10-19 MED ORDER — LIDOCAINE 2% (20 MG/ML) 5 ML SYRINGE
INTRAMUSCULAR | Status: DC | PRN
  Administered 2022-10-19: 60 mg via INTRAVENOUS

## 2022-10-19 MED ORDER — FENTANYL CITRATE (PF) 250 MCG/5ML IJ SOLN
INTRAMUSCULAR | Status: DC | PRN
  Administered 2022-10-19: 50 ug via INTRAVENOUS
  Administered 2022-10-19: 100 ug via INTRAVENOUS
  Administered 2022-10-19 (×2): 50 ug via INTRAVENOUS

## 2022-10-19 MED ORDER — ALBUMIN HUMAN 5 % IV SOLN: INTRAVENOUS | Status: DC | PRN

## 2022-10-19 MED ORDER — PHENYLEPHRINE HCL-NACL 20-0.9 MG/250ML-% IV SOLN
INTRAVENOUS | Status: DC | PRN
  Administered 2022-10-19: 25 ug/min via INTRAVENOUS

## 2022-10-19 MED ORDER — BUPIVACAINE LIPOSOME 1.3 % IJ SUSP
INTRAMUSCULAR | Status: DC | PRN
  Administered 2022-10-19: 20 mL

## 2022-10-19 MED ORDER — AMLODIPINE BESYLATE 10 MG PO TABS
10.0000 mg | ORAL_TABLET | Freq: Every day | ORAL | Status: DC
  Filled 2022-10-19: qty 1

## 2022-10-19 MED ORDER — CEFAZOLIN SODIUM-DEXTROSE 2-4 GM/100ML-% IV SOLN
2.0000 g | Freq: Three times a day (TID) | INTRAVENOUS | Status: AC
  Administered 2022-10-19 (×2): 2 g via INTRAVENOUS
  Filled 2022-10-19 (×2): qty 100

## 2022-10-19 MED ORDER — PHENYLEPHRINE 80 MCG/ML (10ML) SYRINGE FOR IV PUSH (FOR BLOOD PRESSURE SUPPORT)
PREFILLED_SYRINGE | INTRAVENOUS | Status: DC | PRN
  Administered 2022-10-19: 80 ug via INTRAVENOUS

## 2022-10-19 MED ORDER — MENTHOL 3 MG MT LOZG: 1.0000 | LOZENGE | OROMUCOSAL | Status: DC | PRN

## 2022-10-19 MED ORDER — AMISULPRIDE (ANTIEMETIC) 5 MG/2ML IV SOLN
10.0000 mg | Freq: Once | INTRAVENOUS | Status: AC | PRN
  Administered 2022-10-19: 10 mg via INTRAVENOUS

## 2022-10-19 MED ORDER — DEXAMETHASONE SODIUM PHOSPHATE 10 MG/ML IJ SOLN
INTRAMUSCULAR | Status: AC
  Filled 2022-10-19: qty 1

## 2022-10-19 MED ORDER — 0.9 % SODIUM CHLORIDE (POUR BTL) OPTIME
TOPICAL | Status: DC | PRN
  Administered 2022-10-19: 1000 mL

## 2022-10-19 MED ORDER — ONDANSETRON HCL 4 MG PO TABS: 4.0000 mg | ORAL_TABLET | Freq: Four times a day (QID) | ORAL | Status: DC | PRN

## 2022-10-19 MED ORDER — PROPOFOL 10 MG/ML IV BOLUS
INTRAVENOUS | Status: DC | PRN
  Administered 2022-10-19: 140 mg via INTRAVENOUS

## 2022-10-19 MED ORDER — BUPIVACAINE-EPINEPHRINE 0.5% -1:200000 IJ SOLN
INTRAMUSCULAR | Status: DC | PRN
  Administered 2022-10-19: 5 mL

## 2022-10-19 MED ORDER — HYDROMORPHONE HCL 1 MG/ML IJ SOLN: 0.5000 mg | INTRAMUSCULAR | Status: DC | PRN

## 2022-10-19 MED ORDER — ACETAMINOPHEN 500 MG PO TABS
ORAL_TABLET | ORAL | Status: AC
  Administered 2022-10-19: 1000 mg via ORAL
  Filled 2022-10-19: qty 2

## 2022-10-19 MED ORDER — AMISULPRIDE (ANTIEMETIC) 5 MG/2ML IV SOLN
INTRAVENOUS | Status: AC
  Filled 2022-10-19: qty 4

## 2022-10-19 MED ORDER — ONDANSETRON HCL 4 MG/2ML IJ SOLN
INTRAMUSCULAR | Status: DC | PRN
  Administered 2022-10-19: 4 mg via INTRAVENOUS
  Administered 2022-10-19: 50 mg via INTRAVENOUS

## 2022-10-19 MED ORDER — LACTATED RINGERS IV SOLN: INTRAVENOUS | Status: DC | PRN

## 2022-10-19 MED ORDER — LACTATED RINGERS IV SOLN: INTRAVENOUS | Status: DC

## 2022-10-19 MED ORDER — CHLORHEXIDINE GLUCONATE 0.12 % MT SOLN
OROMUCOSAL | Status: AC
  Administered 2022-10-19: 15 mL via OROMUCOSAL
  Filled 2022-10-19: qty 15

## 2022-10-19 MED ORDER — OXYCODONE HCL 5 MG PO TABS
5.0000 mg | ORAL_TABLET | ORAL | Status: DC | PRN
  Administered 2022-10-19 (×2): 5 mg via ORAL
  Filled 2022-10-19 (×2): qty 1

## 2022-10-19 MED ORDER — SUGAMMADEX SODIUM 200 MG/2ML IV SOLN
INTRAVENOUS | Status: DC | PRN
  Administered 2022-10-19: 200 mg via INTRAVENOUS

## 2022-10-19 MED ORDER — SODIUM CHLORIDE 0.9 % IV SOLN
250.0000 mL | INTRAVENOUS | Status: DC
  Administered 2022-10-19: 250 mL via INTRAVENOUS

## 2022-10-19 MED ORDER — THROMBIN 5000 UNITS EX SOLR
OROMUCOSAL | Status: DC | PRN
  Administered 2022-10-19: 5 mL via TOPICAL

## 2022-10-19 MED ORDER — CEFAZOLIN SODIUM-DEXTROSE 2-4 GM/100ML-% IV SOLN: 2.0000 g | INTRAVENOUS | Status: DC

## 2022-10-19 MED ORDER — SODIUM CHLORIDE 0.9% FLUSH
3.0000 mL | Freq: Two times a day (BID) | INTRAVENOUS | Status: DC
  Administered 2022-10-19 (×2): 3 mL via INTRAVENOUS

## 2022-10-19 MED ORDER — BACITRACIN ZINC 500 UNIT/GM EX OINT
TOPICAL_OINTMENT | CUTANEOUS | Status: AC
  Filled 2022-10-19: qty 28.35

## 2022-10-19 MED ORDER — FENTANYL CITRATE (PF) 250 MCG/5ML IJ SOLN
INTRAMUSCULAR | Status: AC
  Filled 2022-10-19: qty 5

## 2022-10-19 MED ORDER — KETOROLAC TROMETHAMINE 15 MG/ML IJ SOLN
7.5000 mg | Freq: Four times a day (QID) | INTRAMUSCULAR | Status: AC
  Administered 2022-10-19 (×2): 7.5 mg via INTRAVENOUS
  Filled 2022-10-19 (×2): qty 1

## 2022-10-19 MED ORDER — PROPOFOL 10 MG/ML IV BOLUS
INTRAVENOUS | Status: AC
  Filled 2022-10-19: qty 20

## 2022-10-19 MED ORDER — ACETAMINOPHEN 325 MG PO TABS
650.0000 mg | ORAL_TABLET | ORAL | Status: DC | PRN
  Administered 2022-10-19: 650 mg via ORAL
  Filled 2022-10-19: qty 2

## 2022-10-19 MED ORDER — LIDOCAINE-EPINEPHRINE 1 %-1:100000 IJ SOLN
INTRAMUSCULAR | Status: DC | PRN
  Administered 2022-10-19: 5 mL

## 2022-10-19 MED ORDER — ONDANSETRON HCL 4 MG/2ML IJ SOLN: 4.0000 mg | Freq: Four times a day (QID) | INTRAMUSCULAR | Status: DC | PRN

## 2022-10-19 MED ORDER — LIDOCAINE-EPINEPHRINE 1 %-1:100000 IJ SOLN
INTRAMUSCULAR | Status: AC
  Filled 2022-10-19: qty 1

## 2022-10-19 MED ORDER — CEFAZOLIN SODIUM-DEXTROSE 2-4 GM/100ML-% IV SOLN
INTRAVENOUS | Status: AC
  Filled 2022-10-19: qty 100

## 2022-10-19 MED ORDER — DEXAMETHASONE SODIUM PHOSPHATE 10 MG/ML IJ SOLN
INTRAMUSCULAR | Status: DC | PRN
  Administered 2022-10-19: 10 mg via INTRAVENOUS

## 2022-10-19 SURGICAL SUPPLY — 73 items
BAG BANDED W/RUBBER/TAPE 36X54 (MISCELLANEOUS) ×2 IMPLANT
BAG COUNTER SPONGE SURGICOUNT (BAG) ×1 IMPLANT
BAND RUBBER #18 3X1/16 STRL (MISCELLANEOUS) IMPLANT
BASKET BONE COLLECTION (BASKET) IMPLANT
BLADE BONE MILL MEDIUM (MISCELLANEOUS) IMPLANT
BUR CARBIDE MATCH 3.0 (BURR) ×1 IMPLANT
CNTNR URN SCR LID CUP LEK RST (MISCELLANEOUS) ×1 IMPLANT
CONT SPEC 4OZ STRL OR WHT (MISCELLANEOUS) ×1
COVER BACK TABLE 60X90IN (DRAPES) ×1 IMPLANT
DERMABOND ADVANCED .7 DNX12 (GAUZE/BANDAGES/DRESSINGS) ×1 IMPLANT
DRAIN JACKSON RD 7FR 3/32 (WOUND CARE) IMPLANT
DRAPE C-ARM 42X72 X-RAY (DRAPES) ×1 IMPLANT
DRAPE C-ARMOR (DRAPES) IMPLANT
DRAPE LAPAROTOMY 100X72X124 (DRAPES) ×1 IMPLANT
DRAPE MICROSCOPE SLANT 54X150 (MISCELLANEOUS) IMPLANT
DRAPE SURG 17X23 STRL (DRAPES) ×1 IMPLANT
DRSG OPSITE 4X5.5 SM (GAUZE/BANDAGES/DRESSINGS) ×1 IMPLANT
DRSG OPSITE POSTOP 4X6 (GAUZE/BANDAGES/DRESSINGS) IMPLANT
DURAPREP 26ML APPLICATOR (WOUND CARE) ×1 IMPLANT
ELECT BLADE INSULATED 4IN (ELECTROSURGICAL) ×1
ELECT REM PT RETURN 9FT ADLT (ELECTROSURGICAL) ×1
ELECTRODE BLADE INSULATED 4IN (ELECTROSURGICAL) ×1 IMPLANT
ELECTRODE REM PT RTRN 9FT ADLT (ELECTROSURGICAL) ×1 IMPLANT
GAUZE 4X4 16PLY ~~LOC~~+RFID DBL (SPONGE) IMPLANT
GAUZE PAD ABD 8X10 STRL (GAUZE/BANDAGES/DRESSINGS) ×1 IMPLANT
GAUZE SPONGE 4X4 12PLY STRL (GAUZE/BANDAGES/DRESSINGS) ×1 IMPLANT
GLOVE BIOGEL PI IND STRL 8 (GLOVE) ×2 IMPLANT
GLOVE ECLIPSE 8.0 STRL XLNG CF (GLOVE) ×2 IMPLANT
GLOVE SURG ENC MOIS LTX SZ8 (GLOVE) ×1 IMPLANT
GLOVE SURG UNDER POLY LF SZ8.5 (GLOVE) ×1 IMPLANT
GOWN STRL REUS W/ TWL LRG LVL3 (GOWN DISPOSABLE) IMPLANT
GOWN STRL REUS W/ TWL XL LVL3 (GOWN DISPOSABLE) ×2 IMPLANT
GOWN STRL REUS W/TWL 2XL LVL3 (GOWN DISPOSABLE) IMPLANT
GOWN STRL REUS W/TWL LRG LVL3 (GOWN DISPOSABLE)
GOWN STRL REUS W/TWL XL LVL3 (GOWN DISPOSABLE) ×3
KIT BASIN OR (CUSTOM PROCEDURE TRAY) ×1 IMPLANT
KIT POSITION SURG JACKSON T1 (MISCELLANEOUS) ×1 IMPLANT
KIT TURNOVER KIT B (KITS) ×1 IMPLANT
MARKER SKIN DUAL TIP RULER LAB (MISCELLANEOUS) ×1 IMPLANT
MILL BONE PREP (MISCELLANEOUS) IMPLANT
NDL HYPO 21X1.5 ECLIPSE (NEEDLE) ×1 IMPLANT
NDL HYPO 25X1 1.5 SAFETY (NEEDLE) ×1 IMPLANT
NDL SPNL 18GX3.5 QUINCKE PK (NEEDLE) IMPLANT
NEEDLE HYPO 21X1.5 ECLIPSE (NEEDLE) ×1 IMPLANT
NEEDLE HYPO 25X1 1.5 SAFETY (NEEDLE) ×1 IMPLANT
NEEDLE SPNL 18GX3.5 QUINCKE PK (NEEDLE) ×1 IMPLANT
NS IRRIG 1000ML POUR BTL (IV SOLUTION) ×1 IMPLANT
PACK LAMINECTOMY NEURO (CUSTOM PROCEDURE TRAY) ×1 IMPLANT
PAD ARMBOARD 7.5X6 YLW CONV (MISCELLANEOUS) ×3 IMPLANT
PATTIES SURGICAL .5 X.5 (GAUZE/BANDAGES/DRESSINGS) IMPLANT
PATTIES SURGICAL .5 X1 (DISPOSABLE) IMPLANT
PATTIES SURGICAL 1X1 (DISPOSABLE) IMPLANT
PUTTY DBM INSTAFILL CART 5CC (Putty) IMPLANT
ROD RELINE-O LORDOTIC 5.5X60MM (Rod) IMPLANT
SCREW LOCK RELINE 5.5 TULIP (Screw) IMPLANT
SCREW RELINE-O POLY 6.5X50MM (Screw) IMPLANT
SCREW RELINE-O POLY 7.5X50 (Screw) ×2 IMPLANT
SCREW RLINE PLY 2S 50X7.5XPA (Screw) IMPLANT
SPIKE FLUID TRANSFER (MISCELLANEOUS) ×1 IMPLANT
SPONGE SURGIFOAM ABS GEL 100 (HEMOSTASIS) ×1 IMPLANT
SPONGE T-LAP 4X18 ~~LOC~~+RFID (SPONGE) IMPLANT
STAPLER VISISTAT 35W (STAPLE) ×1 IMPLANT
SUT PROLENE 6 0 BV (SUTURE) IMPLANT
SUT VIC AB 0 CT1 18XCR BRD8 (SUTURE) ×1 IMPLANT
SUT VIC AB 0 CT1 8-18 (SUTURE) ×1
SUT VIC AB 2-0 CP2 18 (SUTURE) ×1 IMPLANT
SUT VIC AB 3-0 SH 8-18 (SUTURE) ×1 IMPLANT
SYR 20ML LL LF (SYRINGE) ×1 IMPLANT
TOWEL GREEN STERILE (TOWEL DISPOSABLE) IMPLANT
TOWEL GREEN STERILE FF (TOWEL DISPOSABLE) IMPLANT
TRAY FOLEY MTR SLVR 14FR STAT (SET/KITS/TRAYS/PACK) IMPLANT
TRAY FOLEY MTR SLVR 16FR STAT (SET/KITS/TRAYS/PACK) ×1 IMPLANT
WATER STERILE IRR 1000ML POUR (IV SOLUTION) ×1 IMPLANT

## 2022-10-19 NOTE — Anesthesia Procedure Notes (Addendum)
Procedure Name: Intubation Date/Time: 10/19/2022 7:47 AM  Performed by: Bryson Corona, CRNAPre-anesthesia Checklist: Patient identified, Emergency Drugs available, Suction available and Patient being monitored Patient Re-evaluated:Patient Re-evaluated prior to induction Oxygen Delivery Method: Circle System Utilized Preoxygenation: Pre-oxygenation with 100% oxygen Induction Type: IV induction Ventilation: Mask ventilation without difficulty Laryngoscope Size: Mac and 3 Grade View: Grade I Tube type: Oral Tube size: 7.0 mm Number of attempts: 1 Airway Equipment and Method: Stylet Placement Confirmation: ETT inserted through vocal cords under direct vision, positive ETCO2 and breath sounds checked- equal and bilateral Secured at: 22 cm Tube secured with: Tape Dental Injury: Teeth and Oropharynx as per pre-operative assessment

## 2022-10-19 NOTE — Op Note (Signed)
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 Pieter Partridge C. Prisilla Kocsis, DO  ASSISTANT: Dr. Earle Gell, MD  ANESTHESIA: General Endotracheal  EBL: 100 cc  SPECIMENS: None  DRAINS: None  COMPLICATIONS: None  CONDITION: Hemodynamically stable  HISTORY: Mary Mullins is a 76 y.o. female with a history of an open lumbar decompression L2-4 with recurrent lumbar stenosis and recurrent neurogenic claudication with radiculopathy and severe low back pain.  Imaging revealed continued facet arthropathy at L2-3 and L3-4 with moderate to severe stenosis and synovial cysts.  Given this I offered her further decompression at L2-3, L3-4 complete facetectomies, and posterior lateral instrumentation and fusion.  We discussed all risks, benefits and expected outcomes as well as alternatives to treatment.  Informed consent was obtained and witnessed.  PROCEDURE IN DETAIL: The patient was brought to the operating room. After induction of general anesthesia, the patient was positioned on the operative table in the prone position. All pressure points were meticulously padded. Skin incision was then marked out and prepped and draped in the usual sterile fashion. Physician driven timeout was performed.  Local anesthetic was injected into the wound.  Using a 10 blade, prior incision was opened  sharply in midline over the L2-4 levels.  Using Bovie electrocautery, midline dissection was performed down to the dorsal fascia, and subperiosteal dissection was performed to expose the remaining L2 lamina, L2-3 facet, transverse process, L3-4 facet and transverse processes bilaterally.  Deep retractors were placed in the wound.  Lateral fluoroscopy confirmed the appropriate level.  Microscope was sterilely draped and brought into the field for the remainder the procedure.  Using a high-speed drill a bilateral facetectomy was performed up to the ligamentum flavum along the pars bilaterally.  This was then gently dissected from the epidural space and there was noted to be significant adherence from prior midline laminotomy.  The inferior articulating processes were disarticulated bilaterally.  This was kept for autograft as well as the bone dust.  The medial portion of the superior to collating process was removed with a high-speed drill down the ligamentum flavum.  Using Kerrison rongeurs the lateral recesses were decompressed from the ligamentum flavum and the thecal sac was noted to be excellently decompressed bilaterally to the lateral recesses.  Using a ball-tipped probe the bilateral neuroforamen at L2-3 were noted to be open.  Epidural hemostasis was achieved with Surgifoam.  Using the high-speed drill I then repeated the process at the L3-4 level. Using a high-speed drill a bilateral facetectomy was performed up to the ligamentum flavum along the pars bilaterally.  This was then gently dissected from the epidural space..  The inferior articulating processes were disarticulated bilaterally.  This was kept for autograft as well as the bone dust.  The medial portion of the superior to collating process was removed with a high-speed drill down the ligamentum flavum.  Using Kerrison rongeurs the lateral recesses were decompressed from the ligamentum flavum and the thecal sac was noted to be excellently  decompressed  bilaterally to the lateral recesses.  Using a ball-tipped probe the bilateral neuroforamen at L3-4 were noted to be open.  Epidural hemostasis was achieved with Surgifoam.  Attention was then turned to placement of pedicle screws.  Using high-speed drill, pilot hole was created bilaterally to access the L2 pedicles, and pedicle probes were used.  AP and lateral fluoroscopy confirmed appropriate trajectories.  Ball-tipped probe confirmed appropriate bony borders in all directions and a bony floor.  5.5 mm tap was then used, 6.5 mm x 50 mm screws were placed bilaterally with appropriate bony purchase.  This was repeated bilaterally at L3 and L4 and the above listed pedicle screws were placed without complication.  Appropriate size rod was then measured, contoured and placed, setscrews were placed and final tightened to the manufacturer's recommendation.  The remaining superior to collating processes and transverse processes were decorticated with a high-speed drill, autograft and allograft was packed in the lateral gutters bilaterally.  Deep retractors were taken in the wound.  Soft tissue hemostasis was achieved with bipolar cautery.  The epidural space was noted to be excellently hemostatic, this was covered with pieces of Gelfoam.  The wound was then closed in layers, 0 Vicryl for muscle and fascia.  203 0 Vicryl sutures for dermis, skin was closed with skin glue.  Sterile dressing applied.  At the end of the case all sponge, needle, and instrument counts were correct. The patient was then transferred to the stretcher, extubated, and taken to the post-anesthesia care unit in stable hemodynamic condition.

## 2022-10-19 NOTE — Progress Notes (Signed)
Orthopedic Tech Progress Note Patient Details:  Mary Mullins December 25, 1945 [id number]  Ortho Devices Type of Ortho Device: Lumbar corsett Ortho Device/Splint Location: BACK Ortho Device/Splint Interventions: Ordered   Post Interventions Patient Tolerated: Well Instructions Provided: Care of device  Janit Pagan 10/19/2022, 1:20 PM

## 2022-10-19 NOTE — Anesthesia Postprocedure Evaluation (Signed)
Anesthesia Post Note  Patient: Mary Mullins  Procedure(s) Performed: OPEN LUMBAR DECOMP AND POSTERIOR LATERAL FUSION LUMBAR TWO-THREE, LUMBAR THREE-FOUR     Patient location during evaluation: PACU Anesthesia Type: General Level of consciousness: awake and alert Pain management: pain level controlled Vital Signs Assessment: post-procedure vital signs reviewed and stable Respiratory status: spontaneous breathing, nonlabored ventilation, respiratory function stable and patient connected to nasal cannula oxygen Cardiovascular status: blood pressure returned to baseline and stable Postop Assessment: no apparent nausea or vomiting Anesthetic complications: no  No notable events documented.  Last Vitals:  Vitals:   10/19/22 1315 10/19/22 1352  BP: (!) 142/66 (!) 154/66  Pulse: 86 91  Resp: 20 18  Temp: 36.6 C 36.5 C  SpO2: 100% 99%    Last Pain:  Vitals:   10/19/22 1352  TempSrc: Oral  PainSc:                  [name]

## 2022-10-19 NOTE — Transfer of Care (Signed)
Immediate Anesthesia Transfer of Care Note  Patient: Mary Mullins  Procedure(s) Performed: OPEN LUMBAR DECOMP AND POSTERIOR LATERAL FUSION LUMBAR TWO-THREE, LUMBAR THREE-FOUR  Patient Location: PACU  Anesthesia Type:General  Level of Consciousness: awake and alert   Airway & Oxygen Therapy: Patient Spontanous Breathing and Patient connected to face mask oxygen  Post-op Assessment: Report given to RN, Post -op Vital signs reviewed and stable, and Patient moving all extremities X 4  Post vital signs: Reviewed and stable  Last Vitals:  Vitals Value Taken Time  BP 137/64 (85)   Temp    Pulse 78 10/19/22 1222  Resp 17 10/19/22 1222  SpO2 98 % 10/19/22 1222  Vitals shown include unvalidated device data.  Last Pain:  Vitals:   10/19/22 0613  TempSrc:   PainSc: 0-No pain      Patients Stated Pain Goal: 3 (15/37/94 3276)  Complications: No notable events documented.

## 2022-10-19 NOTE — H&P (Signed)
Providing Compassionate, Quality Care - Together  NEUROSURGERY HISTORY & PHYSICAL   Mary Mullins is an 76 y.o. female.   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  MRI revealed recurrent lumbar stenosis and she has had progressive difficulty standing up straight and walking and standing more than a few moments.  Given this, I offered her surgical intervention which she presents for today, L2-4 revision decompression with posterior lateral instrumentation and fusion.  Past Medical History:  Diagnosis Date   Complication of anesthesia    Took a while to wake up after wrist surgery   Dyslipidemia    Hypertension    Neuromuscular disorder ([place])    Right shoulder 80 percent use    Pulmonary nodule    8 mm pleural based LLL nodule, stabel 05/13/21 CT ([facility])    Past Surgical History:  Procedure Laterality Date   ANKLE FRACTURE SURGERY     L ankle   CATARACT EXTRACTION W/ INTRAOCULAR LENS IMPLANT Bilateral    ENTEROCELE REPAIR  08/21/2021   EYE SURGERY     LUMBAR LAMINECTOMY/DECOMPRESSION MICRODISCECTOMY N/A 10/13/2021   Procedure: OPEN LAMINECTOMY, LUMBAR TWO-THREE, LUMBAR  THREE-FOUR;  Surgeon: Kairie Vangieson, Theodoro Doing, DO;  Location: [facility];  Service: Neurosurgery;  Laterality: N/A;   TONSILLECTOMY     TRIGGER FINGER RELEASE Right 02/17/2015   Procedure:  RIGHT LONG FINGER AND RING FINGER TRIGGER RELEASE;  Surgeon: Milly Jakob, MD;  Location: [place];  Service: Orthopedics;  Laterality: Right;   WRIST FRACTURE SURGERY     R wrist   WRIST SURGERY Right 10/25/2005    Family History  Problem Relation Age of Onset   Heart Problems Mother    Cirrhosis Father    Social History:  reports that she has never smoked. She has never used  smokeless tobacco. She reports current alcohol use. She reports that she does not use drugs.  Allergies: No Known Allergies  Medications Prior to Admission  Medication Sig Dispense Refill   amLODipine (NORVASC) 10 MG tablet Take 10 mg by mouth daily.     HYDROcodone-acetaminophen (NORCO) 7.5-325 MG tablet Take 1 tablet by mouth every 4 (four) hours as needed for moderate pain ((score 4 to 6)). (Patient not taking: Reported on 10/07/2022) 30 tablet 0   methocarbamol (ROBAXIN) 500 MG tablet Take 1 tablet (500 mg total) by mouth every 6 (six) hours as needed for muscle spasms. (Patient not taking: Reported on 10/07/2022) 90 tablet 1    No results found for this or any previous visit (from the past 48 hour(s)). No results found.  ROS All pertinent positives and negatives are listed in HPI above Blood pressure (!) 161/67, pulse 84, temperature 98.6 F (37 C), temperature source Oral, resp. rate 18, height '5\' 9"'$  (1.753 m), weight 90.7 kg, SpO2 96 %. 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  All risks benefits and expected outcomes were discussed and agreed upon.  Informed consent was obtained and witnessed.   Thank  you for allowing me to participate in this patient's care.  Please do not hesitate to call with questions or concerns.   Elwin Sleight, [facility] Cell: [phone]

## 2022-10-20 DIAGNOSIS — M48062 Spinal stenosis, lumbar region with neurogenic claudication: Secondary | ICD-10-CM | POA: Diagnosis not present

## 2022-10-20 MED ORDER — METHOCARBAMOL 500 MG PO TABS: 500.0000 mg | ORAL_TABLET | Freq: Four times a day (QID) | ORAL | 1 refills | Status: DC | PRN

## 2022-10-20 MED ORDER — OXYCODONE HCL 10 MG PO TABS: 10.0000 mg | ORAL_TABLET | Freq: Four times a day (QID) | ORAL | 0 refills | Status: DC | PRN

## 2022-10-20 NOTE — Discharge Instructions (Signed)
Wound Care Remove dressing in 3 days Leave incision open to air. You may shower. Do not scrub directly on incision.  Do not put any creams, lotions, or ointments on incision. Activity Walk each and every day, increasing distance each day. No lifting greater than 5 lbs.  Avoid bending, arching, and twisting. No driving for 2 weeks; may ride as a passenger locally. If provided with back brace, wear when out of bed.  It is not necessary to wear in bed. Diet Resume your normal diet.   Call Your Doctor If Any of These Occur Redness, drainage, or swelling at the wound.  Temperature greater than 101 degrees. Severe pain not relieved by pain medication. Incision starts to come apart. Follow Up Appt Call  [phone]) for problems.  If you have any hardware placed in your spine, you will need an x-ray before your appointment.

## 2022-10-20 NOTE — Discharge Summary (Signed)
  Physician Discharge Summary  Patient ID: Mary Mullins MRN: [id number] DOB/AGE: April 02, 1946 76 y.o.  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 female with recurrent lumbar stenosis and claudication underwent an elective L2-3, L3-4 revision decompression, posterior lateral instrumentation and fusion.  She tolerated surgery well.  Postoperatively her pain was controlled on oral medication, she was ambulating independently.  She was having normal bowel bladder function.  She was tolerating normal diet.  Her incision was clean dry and intact.  She had a normal postoperative hospitalization.  Treatments: Surgery -L2-3, L3-4 revision decompression, posterior lateral instrumentation and fusion  Discharge Exam: Blood pressure (!) 108/56, pulse 93, temperature 99.5 F (37.5 C), temperature source Oral, resp. rate 16, height '5\' 9"'$  (1.753 m), weight 90.7 kg, SpO2 96 %. Awake, alert, oriented x 3 PERRLA Speech fluent, appropriate CN grossly intact 5/5 BUE/BLE Wound c/d/i  Disposition: Discharge disposition: 01-Home or Self Care       Discharge Instructions     Incentive spirometry RT   Complete by: As directed       Allergies as of 10/20/2022   No Known Allergies      Medication List     STOP taking these medications    HYDROcodone-acetaminophen 7.5-325 MG tablet Commonly known as: NORCO       TAKE these medications    amLODipine 10 MG tablet Commonly known as: NORVASC Take 10 mg by mouth daily.   methocarbamol 500 MG tablet Commonly known as: ROBAXIN Take 1 tablet (500 mg total) by mouth every 6 (six) hours as needed for muscle spasms.   Oxycodone HCl 10 MG Tabs Take 1 tablet (10 mg  total) by mouth every 6 (six) hours as needed for severe pain ((score 7 to 10)).        Follow-up Information     Zeric Baranowski C, DO Follow up in 2 week(s).   Contact information: [address] [place] [address] [phone]                 Signed: Theodoro Doing Ayano Douthitt 10/20/2022, 8:48 AM

## 2022-10-20 NOTE — Evaluation (Signed)
Occupational Therapy Evaluation Patient Details Name: Mary Mullins MRN: [id number] DOB: 09/26/1946 Today's Date: 10/20/2022   History of Present Illness Pt is a 76 yo F that underwent  an elective L2-3, L3-4 revision decompression, posterior lateral instrumentation and fusion. on 10/19/22. PMH significant for but not limited to HTN, pulmonary nodule, neuromuscular disorder (R shoulder 80% use), L ankle fracture surgery, enterocele repair, R wrist fracture surgery.   Clinical Impression   PTA patient independent. Admitted for above and limited by problem list below. Educated on back precautions, brace mgmt, safety, ADL compensatory techniques and recommendations.  Pt able to complete ADLs with up to min assist, transfers and mobility using RW with min guard to close supervision.  She requires intermittent cueing for back precautions functionally and activity tolerance, she will have support of spouse at home as needed.  Pt is asking her family if she can borrow DME (BSC, RW) for home.  OT will follow acutely to optimize independence but anticipate no further needs after dc home.      Recommendations for follow up therapy are one component of a multi-disciplinary discharge planning process, led by the attending physician.  Recommendations may be updated based on patient status, additional functional criteria and insurance authorization.   Follow Up Recommendations  No OT follow up     Assistance Recommended at Discharge Intermittent Supervision/Assistance  Patient can return home with the following A little help with walking and/or transfers;A little help with bathing/dressing/bathroom;Help with stairs or ramp for entrance;Assist for transportation;Assistance with cooking/housework    Functional Status Assessment  Patient has had a recent decline in their functional status and demonstrates the ability to make significant improvements in function in a reasonable and predictable amount of  time.  Equipment Recommendations  BSC/3in1;Other (comment) (RW- pending if pt can borrow from daughter)    Recommendations for Other Services       Precautions / Restrictions Precautions Precautions: Back Precaution Booklet Issued: Yes (comment) Precaution Comments: Instructed in back precautions. No BLT Required Braces or Orthoses: Spinal Brace Spinal Brace: Thoracolumbosacral orthotic;Applied in sitting position Restrictions Weight Bearing Restrictions: No      Mobility Bed Mobility Overal bed mobility: Modified Independent             General bed mobility comments: Performed log rolling    Transfers Overall transfer level: Needs assistance Equipment used: Rolling walker (2 wheels) Transfers: Sit to/from Stand Sit to Stand: Supervision (Cues for hand placement)           General transfer comment: no physcial assist needed      Balance Overall balance assessment: Needs assistance Sitting-balance support: No upper extremity supported, Feet supported Sitting balance-Leahy Scale: Fair     Standing balance support: Bilateral upper extremity supported, During functional activity, No upper extremity supported Standing balance-Leahy Scale: Fair Standing balance comment: RW for mobility                           ADL either performed or assessed with clinical judgement   ADL Overall ADL's : Needs assistance/impaired     Grooming: Min guard;Standing           Upper Body Dressing : Set up;Sitting Upper Body Dressing Details (indicate cue type and reason): cueing for brace mgmt Lower Body Dressing: Minimal assistance;Sit to/from stand;Cueing for back precautions;Cueing for compensatory techniques Lower Body Dressing Details (indicate cue type and reason): pt requires assist for socks, able to don pants without  assist, min guard in standing Toilet Transfer: Supervision/safety;Ambulation;Rolling walker (2 wheels) Toilet Transfer Details (indicate  cue type and reason): cueing for hand placement         Functional mobility during ADLs: Min guard;Supervision/safety General ADL Comments: pt declines education on AE, reports "my husband will help me"     Vision Patient Visual Report: No change from baseline       Perception     Praxis      Pertinent Vitals/Pain Pain Assessment Pain Assessment: Faces Faces Pain Scale: Hurts even more Pain Location: low back Pain Descriptors / Indicators: Discomfort, Operative site guarding Pain Intervention(s): Limited activity within patient's tolerance, Monitored during session, Repositioned     Hand Dominance     Extremity/Trunk Assessment Upper Extremity Assessment Upper Extremity Assessment: Generalized weakness   Lower Extremity Assessment Lower Extremity Assessment: Defer to PT evaluation   Cervical / Trunk Assessment Cervical / Trunk Assessment: Back Surgery;Other exceptions (forward flexed. Reports she has been walking like that for a while d/t pain.  I encouraged upright posture)   Communication Communication Communication: No difficulties   Cognition Arousal/Alertness: Awake/alert Behavior During Therapy: WFL for tasks assessed/performed Overall Cognitive Status: Within Functional Limits for tasks assessed                                       General Comments  pt plans to ask daughters if she can borrow DME (RW, [facility])    Exercises     Shoulder Instructions      Home Living Family/patient expects to be discharged to:: Private residence Living Arrangements: Spouse/significant other Available Help at Discharge: Family;Available 24 hours/day Type of Home: House Home Access: Level entry     Home Layout: Two level Alternate Level Stairs-Number of Steps: flight with landing halfway up Alternate Level Stairs-Rails: Can reach both Bathroom Shower/Tub: Occupational psychologist: Handicapped height     Home Equipment: Shower seat;Grab bars  - tub/shower          Prior Functioning/Environment Prior Level of Function : Independent/Modified Independent;Working/employed;Other (comment) (Activity limited by back pain)                        OT Problem List: Decreased strength;Decreased activity tolerance;Pain;Decreased knowledge of precautions;Decreased knowledge of use of DME or AE;Impaired balance (sitting and/or standing)      OT Treatment/Interventions: Self-care/ADL training;Therapeutic exercise;DME and/or AE instruction;Therapeutic activities;Balance training;Patient/family education    OT Goals(Current goals can be found in the care plan section) Acute Rehab OT Goals Patient Stated Goal: home OT Goal Formulation: With patient Time For Goal Achievement: 11/03/22 Potential to Achieve Goals: Good  OT Frequency: Min 2X/week    Co-evaluation              AM-PAC OT "6 Clicks" Daily Activity     Outcome Measure Help from another person eating meals?: None Help from another person taking care of personal grooming?: A Little Help from another person toileting, which includes using toliet, bedpan, or urinal?: A Little Help from another person bathing (including washing, rinsing, drying)?: A Little Help from another person to put on and taking off regular upper body clothing?: A Little Help from another person to put on and taking off regular lower body clothing?: A Little 6 Click Score: 19   End of Session Equipment Utilized During Treatment: Rolling walker (2 wheels);Back brace  Nurse Communication: Mobility status  Activity Tolerance: Patient tolerated treatment well Patient left: with call bell/phone within reach;Other (comment) (seated EOB)  OT Visit Diagnosis: Other abnormalities of gait and mobility (R26.89)                Time: 5248-1859 OT Time Calculation (min): 23 min Charges:  OT General Charges $OT Visit: 1 Visit OT Evaluation $OT Eval Low Complexity: [employer], OT Acute  Rehabilitation Services Office [phone]   Delight Stare 10/20/2022, 10:47 AM

## 2022-10-20 NOTE — Progress Notes (Signed)
Patient alert and oriented, mae's well, voiding adequate amount of urine, swallowing without difficulty, no c/o pain at time of discharge. Patient discharged home with family. Script and discharged instructions given to patient. Patient and family stated understanding of instructions given. Patient has an appointment with Dr. Reatha Armour

## 2022-10-20 NOTE — Evaluation (Signed)
Physical Therapy Evaluation Patient Details Name: Mary Mullins MRN: [id number] DOB: 04-16-1946 Today's Date: 10/20/2022  History of Present Illness  Pt is a 76 yo F that underwent  an elective L2-3, L3-4 revision decompression, posterior lateral instrumentation and fusion. on 10/19/22. PMH significant for but not limited to HTN, pulmonary nodule, neuromuscular disorder (R shoulder 80% use), L ankle fracture surgery, enterocele repair, R wrist fracture surgery.  Clinical Impression  Patient is s/p above surgery resulting in the deficits listed below (see PT Problem List).  I have encouraged the patient to gradually increase activity daily to tolerance.   I have answered all patient's question regarding PT and mobility.    Pt feels ready for DC home today.        Recommendations for follow up therapy are one component of a multi-disciplinary discharge planning process, led by the attending physician.  Recommendations may be updated based on patient status, additional functional criteria and insurance authorization.  Follow Up Recommendations No PT follow up      Assistance Recommended at Discharge Intermittent Supervision/Assistance  Patient can return home with the following       Equipment Recommendations Rolling walker (2 wheels)  Recommendations for Other Services       Functional Status Assessment Patient has had a recent decline in their functional status and demonstrates the ability to make significant improvements in function in a reasonable and predictable amount of time.     Precautions / Restrictions Precautions Precautions: Back Precaution Booklet Issued: Yes (comment) Precaution Comments: Instructed in back precautions. No BLT Required Braces or Orthoses: Spinal Brace Spinal Brace: Thoracolumbosacral orthotic;Applied in sitting position Restrictions Weight Bearing Restrictions: No      Mobility  Bed Mobility Overal bed mobility: Modified Independent              General bed mobility comments: Performed log rolling    Transfers Overall transfer level: Needs assistance Equipment used: Rolling walker (2 wheels) Transfers: Sit to/from Stand Sit to Stand: Supervision (Cues for hand placement)           General transfer comment: no physcial assist needed    Ambulation/Gait Ambulation/Gait assistance: Supervision Gait Distance (Feet): 100 Feet Assistive device: Rolling walker (2 wheels) Gait Pattern/deviations: Step-through pattern, Decreased stride length Gait velocity: decreased     General Gait Details: took several standing rest breaks, mostly due to arm pain  Stairs Stairs: Yes Stairs assistance: Min guard Stair Management: Two rails, Step to pattern, Forwards Number of Stairs: 4 General stair comments: walked down the stairs backwards  Wheelchair Mobility    Modified Rankin (Stroke Patients Only)       Balance                                             Pertinent Vitals/Pain Pain Assessment Pain Assessment: 0-10 Pain Score: 8  Pain Location: low back and in both arms when walking Pain Intervention(s): Limited activity within patient's tolerance, Monitored during session    Home Living Family/patient expects to be discharged to:: Private residence Living Arrangements: Spouse/significant other Available Help at Discharge: Family;Available 24 hours/day Type of Home: House Home Access: Level entry     Alternate Level Stairs-Number of Steps: flight with landing halfway up Home Layout: Two level Home Equipment: None      Prior Function Prior Level of Function : Independent/Modified Independent;Working/employed;Other (comment) (Activity  limited by back pain)                     Hand Dominance        Extremity/Trunk Assessment   Upper Extremity Assessment Upper Extremity Assessment: Defer to OT evaluation    Lower Extremity Assessment Lower Extremity Assessment:  Generalized weakness    Cervical / Trunk Assessment Cervical / Trunk Assessment: Back Surgery;Other exceptions (forward flexed. Reports she has been walking like that for a while d/t pain.  I encouraged upright posture)  Communication   Communication: No difficulties  Cognition Arousal/Alertness: Awake/alert Behavior During Therapy: WFL for tasks assessed/performed Overall Cognitive Status: Within Functional Limits for tasks assessed                                          General Comments General comments (skin integrity, edema, etc.): No family present    Exercises     Assessment/Plan    PT Assessment Patient does not need any further PT services  PT Problem List         PT Treatment Interventions      PT Goals (Current goals can be found in the Care Plan section)       Frequency       Co-evaluation               AM-PAC PT "6 Clicks" Mobility  Outcome Measure Help needed turning from your back to your side while in a flat bed without using bedrails?: None Help needed moving from lying on your back to sitting on the side of a flat bed without using bedrails?: None Help needed moving to and from a bed to a chair (including a wheelchair)?: A Little Help needed standing up from a chair using your arms (e.g., wheelchair or bedside chair)?: A Little Help needed to walk in hospital room?: A Little Help needed climbing 3-5 steps with a railing? : A Little 6 Click Score: 20    End of Session Equipment Utilized During Treatment: Gait belt;Back brace Activity Tolerance: Patient tolerated treatment well Patient left: in bed;Other (comment) (OT in room to assess) Nurse Communication: Mobility status;Other (comment) (Needs RW if she can't borrow one) PT Visit Diagnosis: Unsteadiness on feet (R26.81)    Time: 1308-6578 PT Time Calculation (min) (ACUTE ONLY): 22 min   Charges:   PT Evaluation $PT Eval Moderate Complexity: [place], PT   Acute Rehabilitation Services  Office [phone] 10/20/2022   Melvern Banker 10/20/2022, 9:37 AM

## 2022-10-21 ENCOUNTER — Encounter (HOSPITAL_COMMUNITY): Payer: Self-pay | Admitting: Neurological Surgery

## 2023-02-23 ENCOUNTER — Other Ambulatory Visit: Payer: Self-pay | Admitting: Surgery

## 2023-02-23 DIAGNOSIS — M48062 Spinal stenosis, lumbar region with neurogenic claudication: Secondary | ICD-10-CM

## 2023-03-13 ENCOUNTER — Ambulatory Visit
Admission: RE | Admit: 2023-03-13 | Discharge: 2023-03-13 | Disposition: A | Discharge status: Home / Self Care | Payer: PPO | Source: Ambulatory Visit | Attending: Surgery | Admitting: Surgery

## 2023-03-13 DIAGNOSIS — M48062 Spinal stenosis, lumbar region with neurogenic claudication: Secondary | ICD-10-CM

## 2023-05-17 ENCOUNTER — Ambulatory Visit: Payer: PPO | Admitting: Orthopaedic Surgery

## 2023-05-17 ENCOUNTER — Other Ambulatory Visit (INDEPENDENT_AMBULATORY_CARE_PROVIDER_SITE_OTHER): Payer: PPO

## 2023-05-17 VITALS — Ht 69.0 in | Wt 207.0 lb

## 2023-05-17 DIAGNOSIS — M25552 Pain in left hip: Secondary | ICD-10-CM

## 2023-05-17 DIAGNOSIS — M1612 Unilateral primary osteoarthritis, left hip: Secondary | ICD-10-CM

## 2023-05-17 NOTE — Progress Notes (Signed)
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  She has had 2 extensive lumbar spine surgeries by Dr. Jake Samples here in town and those have gone well.  She still has persistent low back pain but this is more due to facet joint arthritis.  There is no radicular component of her pain and she said she has been offered ablations in her lumbar spine in terms of facet joints.  However she has been dealing with worsening left hip and groin pain for some time now.  Recent x-rays of the lumbar spine showed a little bit of the hip showing arthritis in the hips.  At this point her left hip pain is daily and it is 10 out of 10.  It is detrimentally affecting her mobility, her quality of life and her actives of daily living.  She is not a diabetic.  I was able to review all of her past medical history and medications within epic.  There is been no chest pain or shortness of breath.  She just turned 77 years old but she actually appears younger.  On exam her left hip shows stiffness with internal and external rotation with significant pain in the groin on and on the lateral aspect of her hip and superior lateral with rotation.  It is quite significant.  An AP pelvis and lateral of the left hip shows large osteophytes surrounding the left hip joint.  There are overhanging.  The right hip also has arthritic changes but on exam she is asymptomatic on the right side with full and fluid range of motion and no blocks or rotation.  We had a long and thorough discussion about her x-rays in about her left hip in general.  We discussed hip replacement surgery and talked about the risks and benefits of the surgery as well as what to expect from an intraoperative and postoperative standpoint.  I have encouraged her to also consider the ablations of her lumbar spine facet joints because I think this will help with her back pain and then  hopefully having hip replacement will also improve her posture which can take pressure off of her spine.  We discussed what to expect throughout the course of surgery and I gave her handout about hip replacement surgery.  All questions and concerns were answered and addressed.  We will work on getting this scheduled.

## 2023-06-28 ENCOUNTER — Other Ambulatory Visit: Payer: Self-pay

## 2023-07-06 ENCOUNTER — Inpatient Hospital Stay (HOSPITAL_COMMUNITY): Admission: RE | Admit: 2023-07-06 | Payer: PPO | Source: Ambulatory Visit

## 2023-07-06 ENCOUNTER — Encounter (HOSPITAL_COMMUNITY): Payer: Self-pay

## 2023-07-07 ENCOUNTER — Other Ambulatory Visit: Payer: Self-pay

## 2023-07-07 ENCOUNTER — Emergency Department (HOSPITAL_COMMUNITY): Payer: PPO

## 2023-07-07 ENCOUNTER — Emergency Department (HOSPITAL_COMMUNITY)
Admission: EM | Admit: 2023-07-07 | Discharge: 2023-07-07 | Disposition: A | Discharge status: Home / Self Care | Payer: PPO | Attending: Emergency Medicine | Admitting: Emergency Medicine

## 2023-07-07 ENCOUNTER — Encounter (HOSPITAL_COMMUNITY): Payer: Self-pay

## 2023-07-07 DIAGNOSIS — I10 Essential (primary) hypertension: Secondary | ICD-10-CM | POA: Insufficient documentation

## 2023-07-07 DIAGNOSIS — R39198 Other difficulties with micturition: Secondary | ICD-10-CM | POA: Diagnosis not present

## 2023-07-07 DIAGNOSIS — E876 Hypokalemia: Secondary | ICD-10-CM | POA: Insufficient documentation

## 2023-07-07 DIAGNOSIS — D72819 Decreased white blood cell count, unspecified: Secondary | ICD-10-CM | POA: Insufficient documentation

## 2023-07-07 DIAGNOSIS — E871 Hypo-osmolality and hyponatremia: Secondary | ICD-10-CM | POA: Diagnosis not present

## 2023-07-07 DIAGNOSIS — R1032 Left lower quadrant pain: Secondary | ICD-10-CM | POA: Diagnosis present

## 2023-07-07 LAB — CBC WITH DIFFERENTIAL/PLATELET
Abs Immature Granulocytes: 0 10*3/uL (ref 0.00–0.07)
Basophils Absolute: 0 10*3/uL (ref 0.0–0.1)
Basophils Relative: 1 %
Eosinophils Absolute: 0 10*3/uL (ref 0.0–0.5)
Eosinophils Relative: 1 %
HCT: 39.8 % (ref 36.0–46.0)
Hemoglobin: 13.3 g/dL (ref 12.0–15.0)
Immature Granulocytes: 0 %
Lymphocytes Relative: 39 %
Lymphs Abs: 1.2 10*3/uL (ref 0.7–4.0)
MCH: 29.9 pg (ref 26.0–34.0)
MCHC: 33.4 g/dL (ref 30.0–36.0)
MCV: 89.4 fL (ref 80.0–100.0)
Monocytes Absolute: 0.3 10*3/uL (ref 0.1–1.0)
Monocytes Relative: 11 %
Neutro Abs: 1.4 10*3/uL — ABNORMAL LOW (ref 1.7–7.7)
Neutrophils Relative %: 48 %
Platelets: 222 10*3/uL (ref 150–400)
RBC: 4.45 MIL/uL (ref 3.87–5.11)
RDW: 12.3 % (ref 11.5–15.5)
WBC: 2.9 10*3/uL — ABNORMAL LOW (ref 4.0–10.5)
nRBC: 0 % (ref 0.0–0.2)

## 2023-07-07 LAB — COMPREHENSIVE METABOLIC PANEL
ALT: 39 U/L (ref 0–44)
AST: 27 U/L (ref 15–41)
Albumin: 3.4 g/dL — ABNORMAL LOW (ref 3.5–5.0)
Alkaline Phosphatase: 78 U/L (ref 38–126)
Anion gap: 11 (ref 5–15)
BUN: 15 mg/dL (ref 8–23)
CO2: 23 mmol/L (ref 22–32)
Calcium: 8.4 mg/dL — ABNORMAL LOW (ref 8.9–10.3)
Chloride: 100 mmol/L (ref 98–111)
Creatinine, Ser: 0.87 mg/dL (ref 0.44–1.00)
GFR, Estimated: >60 mL/min (ref >60)
Glucose, Bld: 119 mg/dL — ABNORMAL HIGH (ref 70–99)
Potassium: 3.2 mmol/L — ABNORMAL LOW (ref 3.5–5.1)
Sodium: 134 mmol/L — ABNORMAL LOW (ref 135–145)
Total Bilirubin: 0.5 mg/dL (ref 0.3–1.2)
Total Protein: 6.6 g/dL (ref 6.5–8.1)

## 2023-07-07 LAB — MAGNESIUM: Magnesium: 2.2 mg/dL (ref 1.7–2.4)

## 2023-07-07 LAB — URINALYSIS, W/ REFLEX TO CULTURE (INFECTION SUSPECTED)
Bilirubin Urine: NEGATIVE
Glucose, UA: NEGATIVE mg/dL
Hgb urine dipstick: NEGATIVE
Ketones, ur: 20 mg/dL — AB
Leukocytes,Ua: NEGATIVE
Nitrite: NEGATIVE
Protein, ur: NEGATIVE mg/dL
Specific Gravity, Urine: 1.006 (ref 1.005–1.030)
pH: 7 (ref 5.0–8.0)

## 2023-07-07 MED ORDER — LORAZEPAM 2 MG/ML IJ SOLN
1.0000 mg | Freq: Once | INTRAMUSCULAR | Status: AC
  Administered 2023-07-07: 1 mg via INTRAVENOUS
  Filled 2023-07-07: qty 1

## 2023-07-07 MED ORDER — IOHEXOL 300 MG/ML  SOLN
100.0000 mL | Freq: Once | INTRAMUSCULAR | Status: AC | PRN
  Administered 2023-07-07: 100 mL via INTRAVENOUS

## 2023-07-07 MED ORDER — POTASSIUM CHLORIDE CRYS ER 20 MEQ PO TBCR: 20.0000 meq | EXTENDED_RELEASE_TABLET | Freq: Once | ORAL | 0 refills | Status: DC

## 2023-07-07 MED ORDER — MORPHINE SULFATE (PF) 2 MG/ML IV SOLN: 4.0000 mg | Freq: Once | INTRAVENOUS | Status: DC

## 2023-07-07 NOTE — ED Provider Notes (Signed)
[place] EMERGENCY DEPARTMENT AT [facility] Provider Note   CSN: [id number] Arrival date & time: 07/07/23  0831     History  Chief Complaint  Patient presents with   Urinary Frequency    Mary Mullins is a 77 y.o. female.   Urinary Frequency   77 year old female presents emergency department with complaints of left lower abdominal pain, constipation, urinary retention.  Patient states that she has been having lower abdominal pain for the past couple of years with intermittent worsening of symptoms.  States that when symptoms seem to worsen, she usually uses a laxative to help invoke a bowel movement which relieves symptoms.  States that she began with left lower abdominal pain yesterday that was more intense.  States that she used a laxative in the morning which did produce small bowel movement but has been with persistent pain since then.  Feels like her abdomen is somewhat bloated and feels constipated.  States that she began experiencing feelings of urinary retention yesterday with only urinating about couple of drops of urine overnight.  Reports history of urinary retention similarly before her initial spinal surgery.  Reports associated feelings of nausea with no emesis.  Denies any fever, chills, chest pain, shortness of breath, dysuria, hematuria.  Reports low back pain but nothing more than what she experiences at baseline.  Denies any saddle anesthesia, weakness/sensory deficits in lower extremities, fever, history of IV drug use, prolonged corticosteroid use.  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 Authorizing Provider  potassium chloride SA (KLOR-CON M) 20 MEQ tablet  Take 1 tablet (20 mEq total) by mouth once for 1 dose. 07/07/23 07/07/23 Yes Sherian Maroon A, PA  amLODipine (NORVASC) 10 MG tablet Take 10 mg by mouth daily. 10/05/21   [provider]      Allergies    Patient has no known allergies.    Review of Systems   Review of Systems  Genitourinary:  Positive for frequency.  All other systems reviewed and are negative.   Physical Exam Updated Vital Signs BP (!) 144/82   Pulse 70   Temp 98.3 F (36.8 C) (Oral)   Resp 15   Ht 5\' 9"  (1.753 m)   Wt 93.9 kg   SpO2 97%   BMI 30.57 kg/m  Physical Exam Vitals and nursing note reviewed.  Constitutional:      General: She is not in acute distress.    Appearance: She is well-developed.  HENT:     Head: Normocephalic and atraumatic.  Eyes:     Conjunctiva/sclera: Conjunctivae normal.  Cardiovascular:     Rate and Rhythm: Normal rate and regular rhythm.  Pulmonary:     Effort: Pulmonary effort is normal. No respiratory distress.     Breath sounds: Normal breath sounds.  Abdominal:     Palpations: Abdomen is soft.     Tenderness: There is abdominal tenderness. There is no right CVA tenderness or left CVA tenderness.     Comments: Most abdominal tenderness in left lower quadrant.  Musculoskeletal:     Cervical back: Neck supple.     Comments: Some tenderness midline lumbar spine with paraspinal tenderness.  Patient with symmetric strength in hip flexion/extension, knee flexion/extension, ankle dorsi/plantarflexion.  DTR symmetric at patella.  No  sensory deficits along major nerve distributions of lower extremities.  Skin:    General: Skin is warm and dry.     Capillary Refill: Capillary refill takes less than 2 seconds.  Neurological:     Mental Status: She is alert.  Psychiatric:        Mood and Affect: Mood normal.     ED Results / Procedures / Treatments   Labs (all labs ordered are listed, but only abnormal results are displayed) Labs Reviewed  COMPREHENSIVE  METABOLIC PANEL - Abnormal; Notable for the following components:      Result Value   Sodium 134 (*)    Potassium 3.2 (*)    Glucose, Bld 119 (*)    Calcium 8.4 (*)    Albumin 3.4 (*)    All other components within normal limits  CBC WITH DIFFERENTIAL/PLATELET - Abnormal; Notable for the following components:   WBC 2.9 (*)    Neutro Abs 1.4 (*)    All other components within normal limits  URINALYSIS, W/ REFLEX TO CULTURE (INFECTION SUSPECTED) - Abnormal; Notable for the following components:   Ketones, ur 20 (*)    Bacteria, UA RARE (*)    All other components within normal limits  MAGNESIUM    EKG None  Radiology MR LUMBAR SPINE WO CONTRAST  Result Date: 07/07/2023 CLINICAL DATA:  Low back pain, cauda equina syndrome suspected. Low back pain. History of lumbar surgery. EXAM: MRI LUMBAR SPINE WITHOUT CONTRAST TECHNIQUE: Multiplanar, multisequence MR imaging of the lumbar spine was performed. No intravenous contrast was administered. COMPARISON:  Lumbar spine MRI 03/13/2023 FINDINGS: Segmentation:  Standard. Alignment: Mild lumbar levoscoliosis. Unchanged grade 1 retrolisthesis of L2 on L3 and grade 1 anterolisthesis of L4 on L5. Vertebrae: Unchanged chronic L2 compression fracture. No acute fracture, suspicious marrow lesion, or significant marrow edema. Prior L2-L4 posterior fusion. Conus medullaris and cauda equina: Conus extends to the L2 level. Conus and cauda equina appear normal. Paraspinal and other soft tissues: Postoperative changes in the posterior lumbar soft tissues including an unchanged 4 cm collection in the laminectomy bed at L3-4, possibly a chronic seroma. Disc levels: Disc desiccation in up to mild disc space narrowing throughout the lumbar spine. T12-L1: Negative. L1-2: Mild disc bulging and mild facet hypertrophy without stenosis, unchanged. L2-3: Prior posterior decompression and fusion. Right eccentric disc bulging, endplate spurring, and moderate facet hypertrophy  result in mild right neural foraminal stenosis without spinal stenosis, unchanged. L3-4: Prior posterior decompression and fusion. Disc bulging and moderate facet hypertrophy result in minimal to mild bilateral neural foraminal stenosis without spinal stenosis, unchanged. L4-5: Anterolisthesis with bulging uncovered disc and severe facet and ligamentum flavum hypertrophy result in moderate spinal stenosis and mild bilateral neural foraminal stenosis, unchanged. L5-S1: Mild disc bulging and severe facet hypertrophy result in mild left neural foraminal stenosis without spinal stenosis, unchanged. IMPRESSION: Unchanged lumbar disc and facet degeneration, most notable at L4-5 where there is moderate spinal stenosis. Electronically Signed   By: Sebastian Ache M.D.   On: 07/07/2023 15:23   CT ABDOMEN PELVIS W CONTRAST  Result Date: 07/07/2023 CLINICAL DATA:  Abdominal pain, acute, nonlocalized. Urinary retention. EXAM: CT ABDOMEN AND PELVIS WITH CONTRAST TECHNIQUE: Multidetector CT imaging of the abdomen and pelvis was performed using the standard protocol following bolus administration of intravenous contrast. RADIATION DOSE REDUCTION: This exam was performed according to the departmental dose-optimization program which includes automated exposure control, adjustment of the mA and/or kV according to patient size and/or use of iterative  reconstruction technique. CONTRAST:  OMNIPAQUE IOHEXOL 300 MG/ML  SOLN COMPARISON:  None Available. FINDINGS: Lower chest: No acute abnormality. Hepatobiliary: No focal liver abnormality is seen. No gallstones, gallbladder wall thickening, or biliary dilatation. Pancreas: Unremarkable. No pancreatic ductal dilatation or surrounding inflammatory changes. Spleen: Normal in size without focal abnormality. Adrenals/Urinary Tract: Adrenal glands are unremarkable. Kidneys are normal, without renal calculi, focal lesion, or hydronephrosis. Bladder is decompressed. Stomach/Bowel: Stomach  is within normal limits. Appendix appears normal. Sigmoid diverticulosis. No evidence of bowel wall thickening, distention, or inflammatory changes. Vascular/Lymphatic: Aortic atherosclerosis. No enlarged abdominal or pelvic lymph nodes. Reproductive: Uterus and bilateral adnexa are unremarkable. Other: No abdominal wall hernia or abnormality. No abdominopelvic ascites. Musculoskeletal: Prior L2-L4 laminectomy and posterior spinal fusion. 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 IMPRESSION: 1. No acute abdominopelvic abnormality. 2. Sigmoid diverticulosis without evidence of acute diverticulitis. 3. Prior L2-L4 laminectomy and posterior spinal fusion. 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 Aortic Atherosclerosis (ICD10-I70.0). Electronically Signed   By: Orvan Falconer M.D.   On: 07/07/2023 12:30    Procedures Procedures    Medications Ordered in ED Medications  iohexol (OMNIPAQUE) 300 MG/ML solution 100 mL (100 mLs Intravenous Contrast Given 07/07/23 1121)  LORazepam (ATIVAN) injection 1 mg (1 mg Intravenous Given 07/07/23 1313)    ED Course/ Medical Decision Making/ A&P                                 Medical Decision Making Amount and/or Complexity of Data Reviewed Labs: ordered. Radiology: ordered.  Risk Prescription drug management.   This patient presents to the ED for concern of abdominal pain, urinary tension, this involves an extensive number of treatment options, and is a complaint that carries with it a high risk of complications and morbidity.  The differential diagnosis includes constipation, SBO/LBO, neurogenic bladder, cauda equina, cystitis, bladder outlet obstruction, other   Co morbidities that complicate the patient evaluation  See HPI   Additional history obtained:  Additional history obtained from EMR External records from outside source obtained and reviewed including hospital records   Lab Tests:  I Ordered,  and personally interpreted labs.  The pertinent results include: Hyponatremia, hypokalemia, hypocalcemia of 134, 3.2 and 8.4 respectively.  No transaminitis.  No renal dysfunction.  Leukopenia of 2.9.  No evidence of anemia.  Platelets within range.  UA with rare bacteria 20 ketones but otherwise unremarkable   Imaging Studies ordered:  I ordered imaging studies including CT abdomen pelvis, MRI lumbar spine I independently visualized and interpreted imaging which showed  CT abdomen pelvis: No acute abdominal pelvic abnormality.  Sigmoid diverticulosis.  L2-L4 laminectomy and posterior spinal fusion.  Aortic atherosclerosis. MRI lumbar spine: Unchanged lumbar disc and facet degeneration most noticeable at L4-5 I agree with the radiologist interpretation   Cardiac Monitoring: / EKG:  The patient was maintained on a cardiac monitor.  I personally viewed and interpreted the cardiac monitored which showed an underlying rhythm of: Sinus rhythm   Consultations Obtained:  I requested consultation with attending physician Dr. Charm Barges who was in agreement with treatment plan going forward.    Problem List / ED Course / Critical interventions / Medication management  Abdominal pain, difficulty urinating, leukopenia, hypokalemia I ordered medication including Ativan   Reevaluation of the patient after these medicines showed that the patient improved I have reviewed the patients home medicines  and have made adjustments as needed   Social Determinants of Health:  Denies tobacco, licit drug use   Test / Admission - Considered:  Abdominal pain, difficulty urinating, leukopenia, hypokalemia Vitals signs significant for hypertension with blood pressure 144/82. Otherwise within normal range and stable throughout visit. Laboratory/imaging studies significant for: See above 77 year old female presents emergency department with complaints of left lower abdominal pain as well as feelings of urinary  retention.  Regarding abdominal pain, patient seems to have been experiencing similar abdominal pain for the past few years that has been relieved with invoking a bowel movement.  She reports slightly worsening than baseline of abdominal pain that was not relieved with bowel movement yesterday morning as well as feelings of urinary retention today which prompted visit to the emergency department.  On exam, patient initially with most tenderness in left lower quadrant as well as in suprapubic region.  Initially, concern for mechanical etiology of patient's urinary retention given concern for possible constipation/bowel obstruction/fecal impaction.  Patient CT scan of the abdomen without signs of abnormality.  Given patient's extensive lumbar spine history with multiple surgeries and initially presenting with urinary retention prior to initial surgery, concern for possible cauda equina/spinal cord compression.  MRI imaging was subsequently ordered.  While waiting for MRI imaging, patient was able to void 300 cc independent of any catheterization with some relief of symptoms.  MRI imaging resulted which did not show signs of cauda equina or any acute change from prior imaging performed in May of this year.  Unsure of exact etiology of patient's abdominal pain/urinary retention.  Patient without recent medication changes concerning for anticholinergic side effect.  No evidence of infectious etiology on UA.  CT imaging shows no mechanical obstruction.  Will recommend follow-up with urology in the outpatient setting for reevaluation of temporary urinary retention.  Regarding patient's more chronic abdominal pain, unsure of exact etiology of this as well.  Will recommend follow-up with GI in the outpatient setting.  Patient also found with leukopenia which white blood cells seem to be on the low side of normal at baseline.  Recommend follow-up with primary care/oncology in the outpatient setting.  Treatment plan discussed  at length with patient and she acknowledged understanding was agreeable to said plan.  Patient overall well-appearing, afebrile in no acute distress, tolerating p.o. without difficulty. Worrisome signs and symptoms were discussed with the patient, and the patient acknowledged understanding to return to the ED if noticed. Patient was stable upon discharge.          Final Clinical Impression(s) / ED Diagnoses Final diagnoses:  Left lower quadrant abdominal pain  Leukopenia, unspecified type  Hypokalemia  Difficulty urinating    Rx / DC Orders ED Discharge Orders          Ordered    potassium chloride SA (KLOR-CON M) 20 MEQ tablet   Once        07/07/23 1531              Peter Garter, [place] 07/08/23 0931    Terrilee Files, MD 07/08/23 1735

## 2023-07-07 NOTE — ED Notes (Signed)
Pt was reporting feeling anxious and jittery.  Medication given, Pt was taken to MRI

## 2023-07-07 NOTE — ED Triage Notes (Signed)
Pt brought in by [facility] from home. Pt called out to EMS for urinary retention beginning last night. Pt states last normal urination was sometimes yesterday and that she got up all night having "2 drops of urine" throughout the night. Pt also states feeling constipated and having a "pressure" feeling to LLQ/ groin area.

## 2023-07-07 NOTE — Discharge Instructions (Signed)
As discussed, you did have evidence of low white blood cells.  White blood cells seem to be low at baseline but they are little lower than normal.  Recommend follow-up with your primary care for reassessment of your white blood cells.  Your potassium was also slightly low so we will provide potassium supplementation the outpatient setting for the next few days.  CT imaging of your abdomen did not show signs of abnormality.  MRI imaging of your low back appears stable from prior MRI done in May.  Unsure of exactly what is causing abdominal pain but it seems to be not an emergent pathology.  Will attach information for GI to follow-up with regarding abdominal pain.  Please do not hesitate to return to emergency department if there are worrisome signs and symptoms we discussed become apparent.

## 2023-07-07 NOTE — ED Notes (Signed)
Pt ambulates to the bathroom to void.  

## 2023-07-07 NOTE — ED Notes (Signed)
Pt able to urinate on BP.  Sample sent to the lab.

## 2023-07-11 ENCOUNTER — Encounter (HOSPITAL_COMMUNITY): Payer: Self-pay | Admitting: Internal Medicine

## 2023-07-11 ENCOUNTER — Emergency Department (HOSPITAL_COMMUNITY)
Admission: EM | Admit: 2023-07-11 | Discharge: 2023-07-11 | Discharge status: Against Medical Advice | Payer: PPO | Source: Home / Self Care

## 2023-07-11 ENCOUNTER — Emergency Department (HOSPITAL_COMMUNITY): Payer: PPO

## 2023-07-11 ENCOUNTER — Other Ambulatory Visit: Payer: Self-pay

## 2023-07-11 ENCOUNTER — Inpatient Hospital Stay (HOSPITAL_COMMUNITY)
Admission: EM | Admit: 2023-07-11 | Discharge: 2023-07-13 | DRG: 176 | Disposition: A | Discharge status: Home / Self Care | Payer: PPO | Attending: Internal Medicine | Admitting: Internal Medicine

## 2023-07-11 DIAGNOSIS — Z8616 Personal history of COVID-19: Secondary | ICD-10-CM

## 2023-07-11 DIAGNOSIS — Z5321 Procedure and treatment not carried out due to patient leaving prior to being seen by health care provider: Secondary | ICD-10-CM | POA: Insufficient documentation

## 2023-07-11 DIAGNOSIS — E871 Hypo-osmolality and hyponatremia: Secondary | ICD-10-CM

## 2023-07-11 DIAGNOSIS — I2693 Single subsegmental pulmonary embolism without acute cor pulmonale: Secondary | ICD-10-CM | POA: Diagnosis not present

## 2023-07-11 DIAGNOSIS — R350 Frequency of micturition: Secondary | ICD-10-CM | POA: Insufficient documentation

## 2023-07-11 DIAGNOSIS — R110 Nausea: Secondary | ICD-10-CM | POA: Insufficient documentation

## 2023-07-11 DIAGNOSIS — E860 Dehydration: Secondary | ICD-10-CM | POA: Diagnosis present

## 2023-07-11 DIAGNOSIS — R1030 Lower abdominal pain, unspecified: Secondary | ICD-10-CM | POA: Insufficient documentation

## 2023-07-11 DIAGNOSIS — Z23 Encounter for immunization: Secondary | ICD-10-CM

## 2023-07-11 DIAGNOSIS — U071 COVID-19: Secondary | ICD-10-CM

## 2023-07-11 DIAGNOSIS — I2699 Other pulmonary embolism without acute cor pulmonale: Secondary | ICD-10-CM | POA: Diagnosis not present

## 2023-07-11 DIAGNOSIS — M48061 Spinal stenosis, lumbar region without neurogenic claudication: Secondary | ICD-10-CM | POA: Diagnosis present

## 2023-07-11 DIAGNOSIS — E785 Hyperlipidemia, unspecified: Secondary | ICD-10-CM | POA: Diagnosis present

## 2023-07-11 DIAGNOSIS — Z79899 Other long term (current) drug therapy: Secondary | ICD-10-CM

## 2023-07-11 DIAGNOSIS — R109 Unspecified abdominal pain: Secondary | ICD-10-CM | POA: Diagnosis present

## 2023-07-11 DIAGNOSIS — I10 Essential (primary) hypertension: Secondary | ICD-10-CM

## 2023-07-11 LAB — URINALYSIS, ROUTINE W REFLEX MICROSCOPIC
Bilirubin Urine: NEGATIVE
Glucose, UA: NEGATIVE mg/dL
Glucose, UA: NEGATIVE mg/dL
Hgb urine dipstick: NEGATIVE
Hgb urine dipstick: NEGATIVE
Ketones, ur: 5 mg/dL — AB
Ketones, ur: 20 mg/dL — AB
Leukocytes,Ua: NEGATIVE
Nitrite: NEGATIVE
Nitrite: NEGATIVE
Protein, ur: 30 mg/dL — AB
Protein, ur: 30 mg/dL — AB
Specific Gravity, Urine: 1.026 (ref 1.005–1.030)
Specific Gravity, Urine: 1.044 — ABNORMAL HIGH (ref 1.005–1.030)
pH: 6 (ref 5.0–8.0)
pH: 5 (ref 5.0–8.0)

## 2023-07-11 LAB — COMPREHENSIVE METABOLIC PANEL
ALT: 29 U/L (ref 0–44)
ALT: 27 U/L (ref 0–44)
AST: 25 U/L (ref 15–41)
AST: 19 U/L (ref 15–41)
Albumin: 3.7 g/dL (ref 3.5–5.0)
Albumin: 3.9 g/dL (ref 3.5–5.0)
Alkaline Phosphatase: 85 U/L (ref 38–126)
Alkaline Phosphatase: 86 U/L (ref 38–126)
Anion gap: 11 (ref 5–15)
Anion gap: 8 (ref 5–15)
BUN: 11 mg/dL (ref 8–23)
BUN: 14 mg/dL (ref 8–23)
CO2: 22 mmol/L (ref 22–32)
CO2: 22 mmol/L (ref 22–32)
Calcium: 9.4 mg/dL (ref 8.9–10.3)
Calcium: 9 mg/dL (ref 8.9–10.3)
Chloride: 101 mmol/L (ref 98–111)
Chloride: 100 mmol/L (ref 98–111)
Creatinine, Ser: 0.95 mg/dL (ref 0.44–1.00)
Creatinine, Ser: 0.84 mg/dL (ref 0.44–1.00)
GFR, Estimated: >60 mL/min (ref >60)
GFR, Estimated: >60 mL/min (ref >60)
Glucose, Bld: 134 mg/dL — ABNORMAL HIGH (ref 70–99)
Glucose, Bld: 126 mg/dL — ABNORMAL HIGH (ref 70–99)
Potassium: 3.8 mmol/L (ref 3.5–5.1)
Potassium: 3.5 mmol/L (ref 3.5–5.1)
Sodium: 134 mmol/L — ABNORMAL LOW (ref 135–145)
Sodium: 130 mmol/L — ABNORMAL LOW (ref 135–145)
Total Bilirubin: 0.6 mg/dL (ref 0.3–1.2)
Total Bilirubin: 0.8 mg/dL (ref 0.3–1.2)
Total Protein: 6.8 g/dL (ref 6.5–8.1)
Total Protein: 7.1 g/dL (ref 6.5–8.1)

## 2023-07-11 LAB — CBC
HCT: 43.3 % (ref 36.0–46.0)
HCT: 42.8 % (ref 36.0–46.0)
Hemoglobin: 14.4 g/dL (ref 12.0–15.0)
Hemoglobin: 14.3 g/dL (ref 12.0–15.0)
MCH: 29.9 pg (ref 26.0–34.0)
MCH: 29.5 pg (ref 26.0–34.0)
MCHC: 33.3 g/dL (ref 30.0–36.0)
MCHC: 33.4 g/dL (ref 30.0–36.0)
MCV: 90 fL (ref 80.0–100.0)
MCV: 88.4 fL (ref 80.0–100.0)
Platelets: 284 10*3/uL (ref 150–400)
Platelets: 262 10*3/uL (ref 150–400)
RBC: 4.81 MIL/uL (ref 3.87–5.11)
RBC: 4.84 MIL/uL (ref 3.87–5.11)
RDW: 12.1 % (ref 11.5–15.5)
RDW: 12.2 % (ref 11.5–15.5)
WBC: 6.3 10*3/uL (ref 4.0–10.5)
WBC: 8.1 10*3/uL (ref 4.0–10.5)
nRBC: 0 % (ref 0.0–0.2)
nRBC: 0 % (ref 0.0–0.2)

## 2023-07-11 LAB — LIPASE, BLOOD
Lipase: 24 U/L (ref 11–51)
Lipase: 19 U/L (ref 11–51)

## 2023-07-11 LAB — TROPONIN I (HIGH SENSITIVITY): Troponin I (High Sensitivity): 3 ng/L (ref <18)

## 2023-07-11 MED ORDER — IOHEXOL 350 MG/ML SOLN
100.0000 mL | Freq: Once | INTRAVENOUS | Status: AC | PRN
  Administered 2023-07-11: 100 mL via INTRAVENOUS

## 2023-07-11 MED ORDER — HEPARIN (PORCINE) 25000 UT/250ML-% IV SOLN
1500.0000 [IU]/h | INTRAVENOUS | Status: DC
  Administered 2023-07-11: 1500 [IU]/h via INTRAVENOUS
  Filled 2023-07-11: qty 250

## 2023-07-11 MED ORDER — SODIUM CHLORIDE 0.9% FLUSH
3.0000 mL | Freq: Two times a day (BID) | INTRAVENOUS | Status: DC
  Administered 2023-07-11 – 2023-07-13 (×4): 3 mL via INTRAVENOUS

## 2023-07-11 MED ORDER — ALUM & MAG HYDROXIDE-SIMETH 200-200-20 MG/5ML PO SUSP: 30.0000 mL | Freq: Four times a day (QID) | ORAL | Status: DC | PRN

## 2023-07-11 MED ORDER — SENNOSIDES-DOCUSATE SODIUM 8.6-50 MG PO TABS: 1.0000 | ORAL_TABLET | Freq: Every evening | ORAL | Status: DC | PRN

## 2023-07-11 MED ORDER — HEPARIN BOLUS VIA INFUSION
4000.0000 [IU] | Freq: Once | INTRAVENOUS | Status: AC
  Administered 2023-07-11: 4000 [IU] via INTRAVENOUS
  Filled 2023-07-11: qty 4000

## 2023-07-11 MED ORDER — POTASSIUM CHLORIDE IN NACL 40-0.9 MEQ/L-% IV SOLN
INTRAVENOUS | Status: AC
  Filled 2023-07-11: qty 1000

## 2023-07-11 MED ORDER — ACETAMINOPHEN 325 MG PO TABS
650.0000 mg | ORAL_TABLET | Freq: Four times a day (QID) | ORAL | Status: DC | PRN
  Administered 2023-07-12: 650 mg via ORAL
  Filled 2023-07-11: qty 2

## 2023-07-11 MED ORDER — OXYCODONE HCL 5 MG PO TABS
5.0000 mg | ORAL_TABLET | Freq: Once | ORAL | Status: AC
  Administered 2023-07-11: 5 mg via ORAL
  Filled 2023-07-11: qty 1

## 2023-07-11 MED ORDER — ONDANSETRON HCL 4 MG PO TABS: 4.0000 mg | ORAL_TABLET | Freq: Four times a day (QID) | ORAL | Status: DC | PRN

## 2023-07-11 MED ORDER — HYDROXYZINE HCL 10 MG PO TABS: 10.0000 mg | ORAL_TABLET | Freq: Three times a day (TID) | ORAL | Status: DC | PRN

## 2023-07-11 MED ORDER — FAMOTIDINE 20 MG PO TABS
10.0000 mg | ORAL_TABLET | Freq: Two times a day (BID) | ORAL | Status: DC
  Administered 2023-07-12 – 2023-07-13 (×4): 10 mg via ORAL
  Filled 2023-07-11 (×4): qty 1

## 2023-07-11 MED ORDER — ONDANSETRON HCL 4 MG/2ML IJ SOLN
4.0000 mg | Freq: Four times a day (QID) | INTRAMUSCULAR | Status: DC | PRN
  Administered 2023-07-12: 4 mg via INTRAVENOUS
  Filled 2023-07-11: qty 2

## 2023-07-11 MED ORDER — ACETAMINOPHEN 650 MG RE SUPP: 650.0000 mg | Freq: Four times a day (QID) | RECTAL | Status: DC | PRN

## 2023-07-11 MED ORDER — GUAIFENESIN ER 600 MG PO TB12
600.0000 mg | ORAL_TABLET | Freq: Two times a day (BID) | ORAL | Status: DC
  Administered 2023-07-12 – 2023-07-13 (×4): 600 mg via ORAL
  Filled 2023-07-11 (×4): qty 1

## 2023-07-11 NOTE — ED Triage Notes (Signed)
Pt to ED POV from home. Pt states she was dx with COVID over labor day weekend and was dx with strep throat yesterday. Pt c/o diffuse lower abd pain and urinary frequency x2 weeks. Pt endorses nausea. Pt denies V/D.

## 2023-07-11 NOTE — Progress Notes (Signed)
ANTICOAGULATION CONSULT NOTE - Initial Consult  Pharmacy Consult for heparin Indication: pulmonary embolus  No Known Allergies  Patient Measurements: Height: 5\' 6"  (167.6 cm) Weight: 90.7 kg (200 lb) IBW/kg (Calculated) : 59.3 Heparin Dosing Weight: 90.7kg  Vital Signs: Temp: 97.5 F (36.4 C) (09/16 1959) Temp Source: Oral (09/16 1959) BP: 158/78 (09/16 1511) Pulse Rate: 87 (09/16 1225)  Labs: Recent Labs    07/11/23 1228 07/11/23 1925  HGB 14.4 14.3  HCT 43.3 42.8  PLT 284 262  CREATININE 0.95 0.84  TROPONINIHS  --  3    Estimated Creatinine Clearance: 61.6 mL/min (by C-G formula based on SCr of 0.84 mg/dL).   Medical History: Past Medical History:  Diagnosis Date   Complication of anesthesia    Took a while to wake up after wrist surgery   Dyslipidemia    Hypertension    Neuromuscular disorder (HCC)    Right shoulder 80 percent use    Pulmonary nodule    8 mm pleural based LLL nodule, stabel 05/13/21 CT ([facility])     Assessment: 77 yo female presents with lower abd pian and urinary frequency.  Of note had covid labor day weekend.  Pharmacy to dose heparin drip for PE.  No prior AC noted  CBC WNL  Goal of Therapy:  Heparin level 0.3-0.7 units/ml Monitor platelets by anticoagulation protocol: Yes   Plan:  Heparin bolus 4000 units x 1 Start heparin drip at 1500 units Heparin level in 8 hours Daily CBC   Arley Phenix RPh 07/11/2023, 10:28 PM

## 2023-07-11 NOTE — ED Triage Notes (Addendum)
Pt had Covid Labor Day weekend, she was dx with Strep in her stomach yesterday, pt c/o abd pain with nausea, chills and sweaty.

## 2023-07-11 NOTE — Hospital Course (Signed)
Mary Mullins is a 77 y.o. female with medical history significant for HTN who is admitted with segmental/subsegmental RLL PE and hypoxia with ambulation.

## 2023-07-11 NOTE — ED Notes (Signed)
Pt did not want to wait any longer. Pt encouraged to stay be insisted on leaving. LWBS pt taken OTF

## 2023-07-11 NOTE — ED Provider Triage Note (Signed)
Emergency Medicine Provider Triage Evaluation Note  Mary Mullins , a 77 y.o. female  was evaluated in triage.  Pt complains of abdominal pain radiating up to the chest. Has been having abdominal pain after being diagnosed with covid about 2 weeks ago. Went to [facility] ER and had CT abdomen/pelvis and MRI of lumbar spine due to urinary retention and obstipation. Imaging was reassuring. Pain has gotten progressively worse, starts in lower abdomen and feels it up into her chest. Initially went to [facility] this morning, left due to wait times. Pain worsened while sitting in the waiting room.   Review of Systems  Positive: Abd pain, chest pain, nausea, chills, diaphoresis, urinary retention Negative: Syncope, SOB  Physical Exam  BP (!) 158/78 (BP Location: Left Arm)   Temp (!) 97.5 F (36.4 C) (Oral)   Resp 17   Ht 5\' 6"  (1.676 m)   Wt 90.7 kg   SpO2 100%   BMI 32.28 kg/m  Gen:   Awake, no distress   Resp:  Normal effort  MSK:   Moves extremities without difficulty  Other:  Patient appears very uncomfortable in exam chair, able to ambulate with assistance, pale  Medical Decision Making  Medically screening exam initiated at 3:59 PM.  Appropriate orders placed.  Mary Mullins was informed that the remainder of the evaluation will be completed by another provider, this initial triage assessment does not replace that evaluation, and the importance of remaining in the ED until their evaluation is complete.  Workup initiated, with degree of discomfort and now radiating up to the chest, will add on CT dissection study   [place] T, PA-C 07/11/23 1603

## 2023-07-11 NOTE — ED Provider Notes (Signed)
[place] EMERGENCY DEPARTMENT AT [facility] Provider Note   CSN: [id number] Arrival date & time: 07/11/23  1429     History  Chief Complaint  Patient presents with   Abdominal Pain   Nausea    Mary Mullins is a 77 y.o. female.  Patient with history of lumbar stenosis, hypertension presents today with multiple complaints. She states that for the past week she has felt generally unwell. Originally her symptoms started with abdominal pain and feeling like she was constipated and having urinary retention. She reportedly was up all night trying to have a bowel movement and urinate and was unable to do either. She reports she was not able to pass flatus either and therefore went to [facility] on 9/12 where she had a CT of her abdomen and an MRI of her spine that were unremarkable, she was given referral to specialists and told she should try laxatives to have a bowel movement. She attempted this with no improvement in symptoms. At this time she was not having any chest pain. Did not that she felt short of breath but thought that was due to feeling bloated from her abdominal pain. She went home and did not feel any better, went to urgent care and was reportedly diagnosed with strep throat and started on Augmentin which she has been taking. She denies any improvement in symptoms and states that she is unable to sleep due to her discomfort and feeling short of breath. Today the pain reportedly worsened and moved into her chest. She originally went to [facility] for evaluation, however left and came here due to extended wait times. She notes while waiting in the lobby to be seen her pain has worsened, her daughter notes that at one point she was sweating, grabbing her upper abdomen, and rocking in the chair. She states that her pain has improved but is still present. She denies any history of blood clots, recent travel, recent surgery, leg pain, or leg swelling.   The history is  provided by the patient. No language interpreter was used.  Abdominal Pain Associated symptoms: chest pain and shortness of breath        Home Medications Prior to Admission medications   Medication Sig Start Date End Date Taking? Authorizing Provider  amLODipine (NORVASC) 10 MG tablet Take 10 mg by mouth daily. 10/05/21   [provider]  potassium chloride SA (KLOR-CON M) 20 MEQ tablet Take 1 tablet (20 mEq total) by mouth once for 1 dose. 07/07/23 07/07/23  Peter Garter, PA      Allergies    Patient has no known allergies.    Review of Systems   Review of Systems  Respiratory:  Positive for shortness of breath.   Cardiovascular:  Positive for chest pain.  Gastrointestinal:  Positive for abdominal pain.  All other systems reviewed and are negative.   Physical Exam Updated Vital Signs BP (!) 158/78 (BP Location: Left Arm)   Temp (!) 97.5 F (36.4 C) (Oral)   Resp 17   Ht 5\' 6"  (1.676 m)   Wt 90.7 kg   SpO2 100%   BMI 32.28 kg/m  Physical Exam Vitals and nursing note reviewed.  Constitutional:      General: She is not in acute distress.    Appearance: Normal appearance. She is normal weight. She is not ill-appearing, toxic-appearing or diaphoretic.  HENT:     Head: Normocephalic and atraumatic.  Cardiovascular:     Rate  and Rhythm: Normal rate and regular rhythm.     Heart sounds: Normal heart sounds.  Pulmonary:     Effort: Pulmonary effort is normal. No respiratory distress.     Breath sounds: Normal breath sounds.  Abdominal:     General: Abdomen is flat.     Palpations: Abdomen is soft.     Tenderness: There is generalized abdominal tenderness. There is no guarding or rebound.  Musculoskeletal:        General: No tenderness. Normal range of motion.     Cervical back: Normal range of motion.     Right lower leg: No edema.     Left lower leg: No edema.  Skin:    General: Skin is warm and dry.  Neurological:     General: No focal deficit  present.     Mental Status: She is alert.  Psychiatric:        Mood and Affect: Mood normal.        Behavior: Behavior normal.     ED Results / Procedures / Treatments   Labs (all labs ordered are listed, but only abnormal results are displayed) Labs Reviewed  COMPREHENSIVE METABOLIC PANEL - Abnormal; Notable for the following components:      Result Value   Sodium 130 (*)    Glucose, Bld 126 (*)    All other components within normal limits  URINALYSIS, ROUTINE W REFLEX MICROSCOPIC - Abnormal; Notable for the following components:   APPearance HAZY (*)    Specific Gravity, Urine 1.044 (*)    Bilirubin Urine SMALL (*)    Ketones, ur 20 (*)    Protein, ur 30 (*)    Bacteria, UA RARE (*)    All other components within normal limits  LIPASE, BLOOD  CBC  TROPONIN I (HIGH SENSITIVITY)  TROPONIN I (HIGH SENSITIVITY)    EKG None  Radiology CT Angio Chest/Abd/Pel for Dissection W and/or Wo Contrast  Result Date: 07/11/2023 CLINICAL DATA:  COVID, strep throat, lower abdominal pain, urinary frequency EXAM: CT ANGIOGRAPHY CHEST, ABDOMEN AND PELVIS TECHNIQUE: Non-contrast CT of the chest was initially obtained. Multidetector CT imaging through the chest, abdomen and pelvis was performed using the standard protocol during bolus administration of intravenous contrast. Multiplanar reconstructed images and MIPs were obtained and reviewed to evaluate the vascular anatomy. RADIATION DOSE REDUCTION: This exam was performed according to the departmental dose-optimization program which includes automated exposure control, adjustment of the mA and/or kV according to patient size and/or use of iterative reconstruction technique. CONTRAST:  OMNIPAQUE IOHEXOL 350 MG/ML SOLN COMPARISON:  CT abdomen/pelvis dated 07/07/2023 FINDINGS: CTA CHEST FINDINGS Cardiovascular: On unenhanced CT, there is no evidence of intramural hematoma. Following contrast administration, there is no evidence of thoracic  aortic aneurysm or dissection. Atherosclerotic calcifications of the aortic arch. Although not tailored for evaluation of the pulmonary arteries, there is satisfactory evaluation of the bilateral pulmonary arteries to the segmental level. Isolated segmental/subsegmental right lower lobe pulmonary embolus (series 10/image 77). Overall clot burden is small. The heart is top-normal in size. No pericardial effusion. No findings suspicious for right heart strain. Mediastinum/Nodes: No suspicious mediastinal lymphadenopathy. Visualized thyroid is unremarkable. Lungs/Pleura: Lungs are clear. 8 x 4 mm triangular subpleural nodule in the left lower lobe (series 6 image 114), favoring a triangular subpleural lymph node, benign. No follow-up is recommended. No suspicious pulmonary nodules. No focal consolidation. Mild bilateral lower lobe atelectasis. No pleural effusion or pneumothorax. Musculoskeletal: Degenerative changes of the thoracic spine. Review of  the MIP images confirms the above findings. CTA ABDOMEN AND PELVIS FINDINGS VASCULAR Aorta: No evidence of abdominal aortic aneurysm or dissection. Atherosclerotic calcifications of the aortic arch. Patent. Celiac: Patent.  Mild atherosclerotic calcifications at the origin. SMA: Patent. Renals: Patent bilaterally. Mild atherosclerotic calcifications at the origin on the left. IMA: Patent. Inflow: Patent bilaterally.  Atherosclerotic calcifications. Veins: Grossly unremarkable. Review of the MIP images confirms the above findings. NON-VASCULAR Hepatobiliary: Liver is within limits. Gallbladder is unremarkable. No intrahepatic or extrahepatic duct dilatation. Pancreas: Within normal limits. Spleen: Within normal limits. Adrenals/Urinary Tract: Adrenal glands are within normal limits. Kidneys are within normal limits.  No hydronephrosis. Bladder is underdistended but unremarkable. Stomach/Bowel: Stomach is notable for a tiny hiatal hernia. No evidence of bowel obstruction.  Appendix is not discretely visualized. Sigmoid diverticulosis, without evidence of diverticulitis. Lymphatic: No suspicious abdominopelvic lymphadenopathy. Reproductive: Uterus is limits. Bilateral ovaries are within normal limits. Other: No abdominopelvic ascites. Musculoskeletal: Moderate superior endplate compression fracture deformity at L2. Status post PLIF at L2-4. Degenerative changes of the lumbar spine. Review of the MIP images confirms the above findings. IMPRESSION: Isolated segmental/subsegmental right lower lobe pulmonary embolus. Overall clot burden is small. No evidence of right heart strain. No evidence of thoracoabdominal aortic aneurysm or dissection. Additional ancillary findings as above. Critical Value/emergent results were called by telephone at the time of interpretation on 07/11/2023 at 7:24 pm to provider Dr Criss Alvine, who verbally acknowledged these results. Electronically Signed   By: Charline Bills M.D.   On: 07/11/2023 19:26    Procedures .Critical Care  Performed by: Mary Bandy, PA-C Authorized by: Mary Bandy, PA-C   Critical care provider statement:    Critical care time (minutes):  30   Critical care was necessary to treat or prevent imminent or life-threatening deterioration of the following conditions:  Circulatory failure   Critical care was time spent personally by me on the following activities:  Development of treatment plan with patient or surrogate, discussions with primary provider, discussions with consultants, evaluation of patient's response to treatment, examination of patient, obtaining history from patient or surrogate, ordering and review of laboratory studies, ordering and review of radiographic studies, pulse oximetry, re-evaluation of patient's condition and review of old charts   Care discussed with: admitting provider       Medications Ordered in ED Medications  heparin bolus via infusion 4,000 Units (has no administration in time range)   heparin ADULT infusion 100 units/mL (25000 units/255mL) (has no administration in time range)  oxyCODONE (Oxy IR/ROXICODONE) immediate release tablet 5 mg (5 mg Oral Given 07/11/23 1624)  iohexol (OMNIPAQUE) 350 MG/ML injection 100 mL (100 mLs Intravenous Contrast Given 07/11/23 1709)    ED Course/ Medical Decision Making/ A&P                                 Medical Decision Making Amount and/or Complexity of Data Reviewed Labs: ordered.  Risk Prescription drug management. Decision regarding hospitalization.   This patient is a 77 y.o. female who presents to the ED for concern of chest pain, shortness of breath, abdominal pain, this involves an extensive number of treatment options, and is a complaint that carries with it a high risk of complications and morbidity. The emergent differential diagnosis prior to evaluation includes, but is not limited to,  AAA, ACS, CHF, pericardial effusion/tamponade, pericarditis, myocarditis, aortic dissection, PE, pneumothorax, esophageal spasm or rupture, pneumonia, arrhythmias, GERD,  reflux/PUD, biliary disease, pancreatitis, costochondritis, anxiety, anemia, Bowel obstruction  This is not an exhaustive differential.   Past Medical History / Co-morbidities / Social History:  has a past medical history of Complication of anesthesia, Dyslipidemia, Hypertension, Neuromuscular disorder (HCC), and Pulmonary nodule.  Additional history: Chart reviewed. Pertinent results include: patient seen on 9/12 at [facility], had normal CT scan and lumbar spine MRI, discharged with recommendations for close outpatient follow-up and return precautions  Physical Exam: Physical exam performed. The pertinent findings include: per above, lung sounds CTA, generalized abdominal TTP, no leg pain or leg swelling  Lab Tests: I ordered, and personally interpreted labs.  The pertinent results include:  Na 130, troponin WNL. No other acute laboratory abnormalities   Imaging  Studies: I ordered imaging studies including CTA dissection study. I independently visualized and interpreted imaging which showed   Isolated segmental/subsegmental right lower lobe pulmonary embolus. Overall clot burden is small. No evidence of right heart strain.   No evidence of thoracoabdominal aortic aneurysm or dissection.  I agree with the radiologist interpretation.   Cardiac Monitoring:  The patient was maintained on a cardiac monitor.  My attending physician Dr. Particia Nearing viewed and interpreted the cardiac monitored which showed an underlying rhythm of: new RBBB, no STEMI. I agree with this interpretation.   Medications: I ordered medication including oxycodone for pain, heparin for PE. Reevaluation of the patient after these medicines showed that the patient improved. I have reviewed the patients home medicines and have made adjustments as needed   Disposition: After consideration of the diagnostic results and the patients response to treatment, I feel that patient will require admission for PE. Initially, given low clot burden, presumed patient might be able to be discharged home with Elliquis and close follow-up. However, upon ambulation with pulse oximetry, patient desaturated to 85% on room air and became tachycardic, short of breath and lightheaded. Given this, she will require admission. Heparin ordered. Discussed plan with patient who is understanding and amenable with this plan.  Discussed patient with hospitalist Dr. Allena Katz who accepts patient for admission.   This is a shared visit with supervising physician Dr. Particia Nearing who has independently evaluated patient & provided guidance in evaluation/management/disposition, in agreement with care   Final Clinical Impression(s) / ED Diagnoses Final diagnoses:  Single subsegmental pulmonary embolism without acute [facility])    Rx / DC Orders ED Discharge Orders     None         Vear Clock 07/11/23  2319    Jacalyn Lefevre, MD 07/11/23 2353

## 2023-07-11 NOTE — ED Notes (Signed)
Ambulated pt with SpO2, pt started off fine HR 88 and SpO2 99. Throughout the walk pt started getting light-headed and dizzy. Pts SpO2 decreased to 85 and HR 106.

## 2023-07-11 NOTE — H&P (Signed)
History and Physical    Mary Mullins HQI:[id number] DOB: Feb 17, 1946 DOA: 07/11/2023  PCP: Mattie Marlin, DO  Patient coming from: Home  I have personally briefly reviewed patient's old medical records in [facility]  Chief Complaint: Abdominal pain, nausea, heartburn, fatigue, cough  HPI: Mary Mullins is a 77 y.o. female with medical history significant for HTN who presented to the ED for evaluation of abdominal pain, nausea, heartburn, fatigue, cough.  Patient tested positive for COVID-19 on 06/28/2023.  Since then she has had poor oral intake and loss of taste.  She has not been eating much these last 2 weeks and states has eaten almost nothing the last week.  She has not been able to keep up with fluid hydration.  She had been having abdominal pain with heartburn/reflux symptoms.  She reports diminished urine output.  She has cough productive of clear sputum.  1 week ago she was treated with a course of azithromycin for sore throat.  She was seen at [facility], ED on 9/12 for her symptoms.  CT abdomen/pelvis with contrast was negative for any acute abdominopelvic abnormality.  She also underwent MRI L-spine which showed unchanged lumbar disc and facet degeneration, most notable at L4-5 where there is moderate spinal stenosis.  She says she was seen at urgent care yesterday and diagnosed with "strep in the stomach."  She was prescribed Augmentin.  She says while in the triage area she developed sweats.  She says this happened again during her CT scan after receiving contrast.  She says she developed sweats and feeling of pain in her stomach.  She denies any recent travel, surgery, tobacco use.  ED Course  Labs/Imaging on admission: I have personally reviewed following labs and imaging studies.  Initial vitals showed BP 158/78, pulse 82, RR 17, temp 97.5 F, SpO2 100% on room air.  Labs show WBC 8.1, hemoglobin 14.3, platelets 262,000, sodium 130, potassium 3.5,  bicarb 22, BUN 14, creatinine 0.84, serum glucose 126, LFTs within normal limits, lipase 19, troponin 3.  Urinalysis shows 20 ketones, 30 protein, negative nitrites and leukocytes, 0-5 RBCs and WBCs, rare bacteria.  CTA chest/abdomen/pelvis showed isolated segmental/subsegmental right lower lobe pulmonary embolus.  Overall clot burden is small.  No evidence of right heart strain.  No evidence of thoracoabdominal aortic aneurysm or dissection.  Patient was ambulated in the ED and while walking became symptomatic with lightheadedness, dizziness, shortness of breath.  SpO2 dropped to 85% and HR increased to 106.  Patient was started on IV heparin.  The hospitalist service was consulted to admit due to hypoxia with ambulation.  Review of Systems: All systems reviewed and are negative except as documented in history of present illness above.   Past Medical History:  Diagnosis Date   Complication of anesthesia    Took a while to wake up after wrist surgery   Dyslipidemia    Hypertension    Neuromuscular disorder (HCC)    Right shoulder 80 percent use    Pulmonary nodule    8 mm pleural based LLL nodule, stabel 05/13/21 CT ([facility])    Past Surgical History:  Procedure Laterality Date   ANKLE FRACTURE SURGERY     L ankle   CATARACT EXTRACTION W/ INTRAOCULAR LENS IMPLANT Bilateral    ENTEROCELE REPAIR  08/21/2021   EYE SURGERY     LUMBAR LAMINECTOMY/DECOMPRESSION MICRODISCECTOMY N/A 10/13/2021   Procedure: OPEN LAMINECTOMY, LUMBAR TWO-THREE, LUMBAR  THREE-FOUR;  Surgeon: Bethann Goo, DO;  Location: [facility] OR;  Service: Neurosurgery;  Laterality: N/A;   TONSILLECTOMY     TRIGGER FINGER RELEASE Right 02/17/2015   Procedure:  RIGHT LONG FINGER AND RING FINGER TRIGGER RELEASE;  Surgeon: Mack Hook, MD;  Location: [facility];  Service: Orthopedics;  Laterality: Right;   WRIST FRACTURE SURGERY     R wrist   WRIST SURGERY Right 10/25/2005    Social History:  reports that she  has never smoked. She has never used smokeless tobacco. She reports current alcohol use. She reports that she does not use drugs.  No Known Allergies  Family History  Problem Relation Age of Onset   Heart Problems Mother    Cirrhosis Father      Prior to Admission medications   Medication Sig Start Date End Date Taking? Authorizing Provider  amLODipine (NORVASC) 10 MG tablet Take 10 mg by mouth daily. 10/05/21   [provider]  potassium chloride SA (KLOR-CON M) 20 MEQ tablet Take 1 tablet (20 mEq total) by mouth once for 1 dose. 07/07/23 07/07/23  Peter Garter, PA    Physical Exam: Vitals:   07/11/23 2045 07/11/23 2100 07/11/23 2115 07/11/23 2300  BP: (!) 167/83 (!) 175/86 (!) 169/88   Pulse: 74 77  87  Resp: 15 17 19 20   Temp:      TempSrc:      SpO2: 97% 99%  100%  Weight:      Height:       Constitutional: Resting in bed, NAD, calm, comfortable Eyes: EOMI, lids and conjunctivae normal ENMT: Mucous membranes are moist. Posterior pharynx clear of any exudate or lesions.Normal dentition.  Neck: normal, supple, no masses. Respiratory: clear to auscultation bilaterally, no wheezing, no crackles. Normal respiratory effort. No accessory muscle use.  Cardiovascular: Regular rate and rhythm, no murmurs / rubs / gallops. No extremity edema. 2+ pedal pulses. Abdomen: Mild epigastric tenderness, no masses palpated.  Musculoskeletal: no clubbing / cyanosis. No joint deformity upper and lower extremities. Good ROM, no contractures. Normal muscle tone.  Skin: no rashes, lesions, ulcers. No induration Neurologic: Sensation intact. Strength 5/5 in all 4.  Psychiatric: Alert and oriented x 3. Normal mood.   EKG: Personally reviewed. Sinus rhythm, rate 77, RBBB and LAFB, no acute ischemic changes.  No prior for comparison.  Assessment/Plan Principal Problem:   Pulmonary embolism (HCC) Active Problems:   Hyponatremia   Hypertension   COVID-19 virus infection   Mary Mullins is a 77 y.o. female with medical history significant for HTN who is admitted with segmental/subsegmental RLL PE and hypoxia with ambulation. *** Assessment and Plan: Segmental/subsegmental RLL pulmonary embolus: Seen on CT imaging with small clot burden, no evidence of right heart strain.  Admitted due to developing hypoxia down to 85% while ambulating.  Saturating well while at rest.  Not sure small PE totally contributing to hypoxia.  Imaging is otherwise negative for pneumonia, effusion, inflammatory process.  Lungs clear on exam. -Started on IV heparin, potentially transition to oral anticoagulation tomorrow -incentive spirometer, flutter valve -Check pulse ox with ambulation in the morning  Hyponatremia: Mild with sodium 130.  She is dehydrated from poor oral intake.  Started on IV fluid hydration overnight.  Abdominal pain: Imaging is negative for acute pathology.  LFTs and lipase normal.  Nonspecific abdominal pain and diminished urine output.  Probably related to dehydration and poor oral intake over the last week.  UA not strongly suspicious for UTI. -Continue IV fluid hydration overnight -  Start on oral Pepcid, Maalox prn -Monitor UOP, bladder scan as needed  Recent COVID-19 viral infection: Tested positive 06/28/2023.  Has some lingering mild cough and has been dehydrated.  Imaging negative for pneumonia or inflammatory process.  Continue supportive care as above.  Hypertension: BP moderately elevated.  She has not been taking her antihypertensives over the last week due to feeling sick.  Restart amlodipine***   DVT prophylaxis: IV heparin Code Status: Full code, confirmed with patient on admission Family Communication: Daughter at bedside Disposition Plan: From home and likely discharge to home in 1-2 days Consults called: None Severity of Illness: The appropriate patient status for this patient is OBSERVATION. Observation status is judged to be reasonable and  necessary in order to provide the required intensity of service to ensure the patient's safety. The patient's presenting symptoms, physical exam findings, and initial radiographic and laboratory data in the context of their medical condition is felt to place them at decreased risk for further clinical deterioration. Furthermore, it is anticipated that the patient will be medically stable for discharge from the hospital within 2 midnights of admission.   Darreld Mclean MD [facility]  If 7PM-7AM, please contact night-coverage [email or web address]  07/11/2023, 11:37 PM

## 2023-07-12 DIAGNOSIS — Z8616 Personal history of COVID-19: Secondary | ICD-10-CM | POA: Diagnosis not present

## 2023-07-12 DIAGNOSIS — I2699 Other pulmonary embolism without acute cor pulmonale: Secondary | ICD-10-CM | POA: Diagnosis present

## 2023-07-12 DIAGNOSIS — M48061 Spinal stenosis, lumbar region without neurogenic claudication: Secondary | ICD-10-CM | POA: Diagnosis present

## 2023-07-12 DIAGNOSIS — E871 Hypo-osmolality and hyponatremia: Secondary | ICD-10-CM | POA: Diagnosis present

## 2023-07-12 DIAGNOSIS — R109 Unspecified abdominal pain: Secondary | ICD-10-CM | POA: Diagnosis present

## 2023-07-12 DIAGNOSIS — Z23 Encounter for immunization: Secondary | ICD-10-CM | POA: Diagnosis present

## 2023-07-12 DIAGNOSIS — Z79899 Other long term (current) drug therapy: Secondary | ICD-10-CM | POA: Diagnosis not present

## 2023-07-12 DIAGNOSIS — E860 Dehydration: Secondary | ICD-10-CM | POA: Diagnosis present

## 2023-07-12 DIAGNOSIS — I10 Essential (primary) hypertension: Secondary | ICD-10-CM | POA: Diagnosis present

## 2023-07-12 DIAGNOSIS — I2693 Single subsegmental pulmonary embolism without acute cor pulmonale: Secondary | ICD-10-CM | POA: Diagnosis present

## 2023-07-12 DIAGNOSIS — E785 Hyperlipidemia, unspecified: Secondary | ICD-10-CM | POA: Diagnosis present

## 2023-07-12 LAB — CBC
HCT: 39.5 % (ref 36.0–46.0)
Hemoglobin: 12.9 g/dL (ref 12.0–15.0)
MCH: 29.5 pg (ref 26.0–34.0)
MCHC: 32.7 g/dL (ref 30.0–36.0)
MCV: 90.4 fL (ref 80.0–100.0)
Platelets: 240 10*3/uL (ref 150–400)
RBC: 4.37 MIL/uL (ref 3.87–5.11)
RDW: 12.3 % (ref 11.5–15.5)
WBC: 8.7 10*3/uL (ref 4.0–10.5)
nRBC: 0 % (ref 0.0–0.2)

## 2023-07-12 LAB — TROPONIN I (HIGH SENSITIVITY): Troponin I (High Sensitivity): 4 ng/L (ref <18)

## 2023-07-12 LAB — BASIC METABOLIC PANEL
Anion gap: 8 (ref 5–15)
BUN: 12 mg/dL (ref 8–23)
CO2: 27 mmol/L (ref 22–32)
Calcium: 8.9 mg/dL (ref 8.9–10.3)
Chloride: 100 mmol/L (ref 98–111)
Creatinine, Ser: 0.87 mg/dL (ref 0.44–1.00)
GFR, Estimated: >60 mL/min (ref >60)
Glucose, Bld: 118 mg/dL — ABNORMAL HIGH (ref 70–99)
Potassium: 3.8 mmol/L (ref 3.5–5.1)
Sodium: 135 mmol/L (ref 135–145)

## 2023-07-12 LAB — HEPARIN LEVEL (UNFRACTIONATED)
Heparin Unfractionated: >1.1 [IU]/mL — ABNORMAL HIGH (ref 0.30–0.70)
Heparin Unfractionated: 0.63 [IU]/mL (ref 0.30–0.70)

## 2023-07-12 LAB — PROTIME-INR
INR: 1 (ref 0.8–1.2)
Prothrombin Time: 13.5 s (ref 11.4–15.2)

## 2023-07-12 LAB — APTT: aPTT: 24 s (ref 24–36)

## 2023-07-12 MED ORDER — ENSURE ENLIVE PO LIQD
237.0000 mL | Freq: Two times a day (BID) | ORAL | Status: DC
  Administered 2023-07-13: 237 mL via ORAL

## 2023-07-12 MED ORDER — PNEUMOCOCCAL 20-VAL CONJ VACC 0.5 ML IM SUSY
0.5000 mL | PREFILLED_SYRINGE | INTRAMUSCULAR | Status: AC
  Administered 2023-07-13: 0.5 mL via INTRAMUSCULAR
  Filled 2023-07-12: qty 0.5

## 2023-07-12 MED ORDER — HEPARIN (PORCINE) 25000 UT/250ML-% IV SOLN
1100.0000 [IU]/h | INTRAVENOUS | Status: DC
  Administered 2023-07-12: 1200 [IU]/h via INTRAVENOUS
  Filled 2023-07-12: qty 250

## 2023-07-12 MED ORDER — AMLODIPINE BESYLATE 10 MG PO TABS
10.0000 mg | ORAL_TABLET | Freq: Every day | ORAL | Status: DC
  Administered 2023-07-12 – 2023-07-13 (×2): 10 mg via ORAL
  Filled 2023-07-12: qty 1
  Filled 2023-07-12: qty 2

## 2023-07-12 NOTE — Progress Notes (Signed)
ANTICOAGULATION CONSULT NOTE - Follow Up Consult  Pharmacy Consult for heparin Indication: acute pulmonary embolus  No Known Allergies  Patient Measurements: Height: 5\' 6"  (167.6 cm) Weight: 90.7 kg (200 lb) IBW/kg (Calculated) : 59.3 Heparin Dosing Weight: 79 kg  Vital Signs: Temp: 98.3 F (36.8 C) (09/17 0924) Temp Source: Oral (09/17 0924) BP: 142/119 (09/17 0924) Pulse Rate: 88 (09/17 0924)  Labs: Recent Labs    07/11/23 1228 07/11/23 1925 07/11/23 2339 07/12/23 0631 07/12/23 0821  HGB 14.4 14.3  --  12.9  --   HCT 43.3 42.8  --  39.5  --   PLT 284 262  --  240  --   APTT  --   --  24  --   --   LABPROT  --   --  13.5  --   --   INR  --   --  1.0  --   --   HEPARINUNFRC  --   --   --   --  >1.10*  CREATININE 0.95 0.84  --  0.87  --   TROPONINIHS  --  3 4  --   --     Estimated Creatinine Clearance: 59.5 mL/min (by C-G formula based on SCr of 0.87 mg/dL).   Assessment: Patient is a 77 y.o F with recent COVID infection who presented to the ED on 07/11/23 with c/o abdominal pain, generalized weakness, cough and nausea. Chest CT on 07/11/23 showed acute RLL PE with no evidence of RHS. She is currently on heparin drip for VTE treatment.  Today, 07/12/2023: - heparin level collected at 8:21a is supra-therapeutic >1.10 with drip infusing at 1500 units/hr. Per pt's RN, heparin drip is infusing in the right arm and heparin level was collected on the left arm. No issues with IV line and no bleeding noted. - cbc ok   Goal of Therapy:  Heparin level 0.3-0.7 units/ml Monitor platelets by anticoagulation protocol: Yes   Plan:  - hold heparin drip for 1 hour, then resume back at 1200 units/hr - check 8 hr heparin level - monitor for s/sx bleeding   Mary Mullins P 07/12/2023,9:46 AM

## 2023-07-12 NOTE — Progress Notes (Signed)
   07/12/23 0045  Chest Physiotherapy Tx  CPT Delivery Source Flutter valve  $ Expiratory Vibration Device Administration  Initial  CPT Duration 7  CPT Chest Site Full range  Post-Treatment Respirations 18  Cough None  Position Sitting (Side of Bed in ED)  CPT Treatment Tolerance Tolerated well   RT note: Flutter started while pt. in ED due to questionable d/c. Pt. able to use independently.

## 2023-07-12 NOTE — ED Notes (Signed)
ED TO INPATIENT HANDOFF REPORT  Name/Age/Gender Mary Mullins 77 y.o. female  Code Status    Code Status Orders  (From admission, onward)           Start     Ordered   07/11/23 2315  Full code  Continuous       Question:  By:  Answer:  Consent: discussion documented in EHR   07/11/23 2316           Code Status History     Date Active Date Inactive Code Status Order ID Comments User Context   10/19/2022 1343 10/20/2022 2020 Full Code [id number]  Bethann Goo, DO Inpatient   10/13/2021 1300 10/14/2021 1709 Full Code [id number]  Dawley, Alan Mulder, DO Inpatient      Advance Directive Documentation    Flowsheet Row Most Recent Value  Type of Advance Directive Healthcare Power of Attorney, Living will  Pre-existing out of facility DNR order (yellow form or pink MOST form) --  "MOST" Form in Place? --       Home/SNF/Other Home  Chief Complaint Pulmonary embolism (HCC) [I26.99]  Level of Care/Admitting Diagnosis ED Disposition     ED Disposition  Admit   Condition  --   Comment  Hospital Area: [facility] [[id number]]  Level of Care: Telemetry [5]  Admit to tele based on following criteria: Monitor for Ischemic changes  May admit patient to [facility] or [facility] if equivalent level of care is available:: No  Covid Evaluation: Confirmed COVID Positive  Diagnosis: Pulmonary embolism [facility]) [[id number]]  Admitting Physician: Phoebe Perch  Attending Physician: Willeen Niece Weber.Bonier  Certification:: I certify this patient will need inpatient services for at least 2 midnights          Medical History Past Medical History:  Diagnosis Date   Complication of anesthesia    Took a while to wake up after wrist surgery   Dyslipidemia    Hypertension    Neuromuscular disorder (HCC)    Right shoulder 80 percent use    Pulmonary nodule    8 mm pleural based LLL nodule, stabel 05/13/21 CT ([facility])    Allergies No Known  Allergies  IV Location/Drains/Wounds Patient Lines/Drains/Airways Status     Active Line/Drains/Airways     Name Placement date Placement time Site Days   Peripheral IV 07/11/23 20 G 1" Posterior;Right Forearm 07/11/23  2340  Forearm  1   Peripheral IV 07/12/23 22 G 1" Anterior;Right Forearm 07/12/23  1028  Forearm  less than 1            Labs/Imaging Results for orders placed or performed during the hospital encounter of 07/11/23 (from the past 48 hour(s))  Urinalysis, Routine w reflex microscopic -Urine, Unspecified Source     Status: Abnormal   Collection Time: 07/11/23  3:51 PM  Result Value Ref Range   Color, Urine YELLOW YELLOW   APPearance HAZY (A) CLEAR   Specific Gravity, Urine 1.044 (H) 1.005 - 1.030   pH 5.0 5.0 - 8.0   Glucose, UA NEGATIVE NEGATIVE mg/dL   Hgb urine dipstick NEGATIVE NEGATIVE   Bilirubin Urine SMALL (A) NEGATIVE   Ketones, ur 20 (A) NEGATIVE mg/dL   Protein, ur 30 (A) NEGATIVE mg/dL   Nitrite NEGATIVE NEGATIVE   Leukocytes,Ua NEGATIVE NEGATIVE   RBC / HPF 0-5 0 - 5 RBC/hpf   WBC, UA 0-5 0 - 5 WBC/hpf   Bacteria, UA RARE (A) NONE SEEN  Squamous Epithelial / HPF 0-5 0 - 5 /HPF   Mucus PRESENT    Hyaline Casts, UA PRESENT    Ca Oxalate Crys, UA PRESENT     Comment: Performed at [facility], [address], [place], [place] [address]  Lipase, blood     Status: None   Collection Time: 07/11/23  7:25 PM  Result Value Ref Range   Lipase 19 11 - 51 U/L    Comment: Performed at [facility], [address], [place], [place] [address]  Comprehensive metabolic panel     Status: Abnormal   Collection Time: 07/11/23  7:25 PM  Result Value Ref Range   Sodium 130 (L) 135 - 145 mmol/L   Potassium 3.5 3.5 - 5.1 mmol/L   Chloride 100 98 - 111 mmol/L   CO2 22 22 - 32 mmol/L   Glucose, Bld 126 (H) 70 - 99 mg/dL    Comment: Glucose reference range applies only to samples taken after fasting for at least 8 hours.    BUN 14 8 - 23 mg/dL   Creatinine, Ser 8.29 0.44 - 1.00 mg/dL   Calcium 9.0 8.9 - 56.2 mg/dL   Total Protein 7.1 6.5 - 8.1 g/dL   Albumin 3.9 3.5 - 5.0 g/dL   AST 19 15 - 41 U/L   ALT 27 0 - 44 U/L   Alkaline Phosphatase 86 38 - 126 U/L   Total Bilirubin 0.8 0.3 - 1.2 mg/dL   GFR, Estimated >13 >08 mL/min    Comment: (NOTE) Calculated using the CKD-EPI Creatinine Equation (2021)    Anion gap 8 5 - 15    Comment: Performed at [facility], [address] [place], [place] [address]  CBC     Status: None   Collection Time: 07/11/23  7:25 PM  Result Value Ref Range   WBC 8.1 4.0 - 10.5 K/uL   RBC 4.84 3.87 - 5.11 MIL/uL   Hemoglobin 14.3 12.0 - 15.0 g/dL   HCT 69.6 29.5 - 28.4 %   MCV 88.4 80.0 - 100.0 fL   MCH 29.5 26.0 - 34.0 pg   MCHC 33.4 30.0 - 36.0 g/dL   RDW 13.2 44.0 - 10.2 %   Platelets 262 150 - 400 K/uL   nRBC 0.0 0.0 - 0.2 %    Comment: Performed at [facility], [address] [place], [place] [address]  Troponin I (High Sensitivity)     Status: None   Collection Time: 07/11/23  7:25 PM  Result Value Ref Range   Troponin I (High Sensitivity) 3 <18 ng/L    Comment: (NOTE) Elevated high sensitivity troponin I (hsTnI) values and significant  changes across serial measurements may suggest ACS but many other  chronic and acute conditions are known to elevate hsTnI results.  Refer to the "Links" section for chest pain algorithms and additional  guidance. Performed at [facility], [address], [place], [place] [address]   Troponin I (High Sensitivity)     Status: None   Collection Time: 07/11/23 11:39 PM  Result Value Ref Range   Troponin I (High Sensitivity) 4 <18 ng/L    Comment: (NOTE) Elevated high sensitivity troponin I (hsTnI) values and significant  changes across serial measurements may suggest ACS but many other  chronic and acute conditions are known to elevate hsTnI results.  Refer to the  "Links" section for chest pain algorithms and additional  guidance. Performed at  [facility], [address], [place], [place] [address]   APTT     Status: None   Collection Time: 07/11/23 11:39 PM  Result Value Ref Range   aPTT 24 24 - 36 seconds    Comment: Performed at [facility], [address], [place], [place] [address]  Protime-INR     Status: None   Collection Time: 07/11/23 11:39 PM  Result Value Ref Range   Prothrombin Time 13.5 11.4 - 15.2 seconds   INR 1.0 0.8 - 1.2    Comment: (NOTE) INR goal varies based on device and disease states. Performed at [facility], [address], [place], [place] [address]   CBC     Status: None   Collection Time: 07/12/23  6:31 AM  Result Value Ref Range   WBC 8.7 4.0 - 10.5 K/uL   RBC 4.37 3.87 - 5.11 MIL/uL   Hemoglobin 12.9 12.0 - 15.0 g/dL   HCT 25.9 56.3 - 87.5 %   MCV 90.4 80.0 - 100.0 fL   MCH 29.5 26.0 - 34.0 pg   MCHC 32.7 30.0 - 36.0 g/dL   RDW 64.3 32.9 - 51.8 %   Platelets 240 150 - 400 K/uL   nRBC 0.0 0.0 - 0.2 %    Comment: Performed at [facility], [address], [place], [place] [address]  Basic metabolic panel     Status: Abnormal   Collection Time: 07/12/23  6:31 AM  Result Value Ref Range   Sodium 135 135 - 145 mmol/L   Potassium 3.8 3.5 - 5.1 mmol/L   Chloride 100 98 - 111 mmol/L   CO2 27 22 - 32 mmol/L   Glucose, Bld 118 (H) 70 - 99 mg/dL    Comment: Glucose reference range applies only to samples taken after fasting for at least 8 hours.   BUN 12 8 - 23 mg/dL   Creatinine, Ser 0.63 0.44 - 1.00 mg/dL   Calcium 8.9 8.9 - 01.6 mg/dL   GFR, Estimated >01 >09 mL/min    Comment: (NOTE) Calculated using the CKD-EPI Creatinine Equation (2021)    Anion gap 8 5 - 15    Comment: Performed at [facility], [address] [place], [place] [address]  Heparin level (unfractionated)     Status: Abnormal    Collection Time: 07/12/23  8:21 AM  Result Value Ref Range   Heparin Unfractionated >1.10 (H) 0.30 - 0.70 IU/mL    Comment: (NOTE) The clinical reportable range upper limit is being lowered to >1.10 to align with the FDA approved guidance for the current laboratory assay.  If heparin results are below expected values, and patient dosage has  been confirmed, suggest follow up testing of antithrombin III levels. Performed at [facility], [address], [place], [place] [address]    CT Angio Chest/Abd/Pel for Dissection W and/or Wo Contrast  Result Date: 07/11/2023 CLINICAL DATA:  COVID, strep throat, lower abdominal pain, urinary frequency EXAM: CT ANGIOGRAPHY CHEST, ABDOMEN AND PELVIS TECHNIQUE: Non-contrast CT of the chest was initially obtained. Multidetector CT imaging through the chest, abdomen and pelvis was performed using the standard protocol during bolus administration of intravenous contrast. Multiplanar reconstructed images and MIPs were obtained and reviewed to evaluate the vascular anatomy. RADIATION DOSE REDUCTION: This exam was performed according to the departmental dose-optimization program which includes automated exposure control, adjustment of the mA and/or kV according to patient size and/or use of  iterative reconstruction technique. CONTRAST:  OMNIPAQUE IOHEXOL 350 MG/ML SOLN COMPARISON:  CT abdomen/pelvis dated 07/07/2023 FINDINGS: CTA CHEST FINDINGS Cardiovascular: On unenhanced CT, there is no evidence of intramural hematoma. Following contrast administration, there is no evidence of thoracic aortic aneurysm or dissection. Atherosclerotic calcifications of the aortic arch. Although not tailored for evaluation of the pulmonary arteries, there is satisfactory evaluation of the bilateral pulmonary arteries to the segmental level. Isolated segmental/subsegmental right lower lobe pulmonary embolus (series 10/image 77). Overall clot burden is small. The  heart is top-normal in size. No pericardial effusion. No findings suspicious for right heart strain. Mediastinum/Nodes: No suspicious mediastinal lymphadenopathy. Visualized thyroid is unremarkable. Lungs/Pleura: Lungs are clear. 8 x 4 mm triangular subpleural nodule in the left lower lobe (series 6 image 114), favoring a triangular subpleural lymph node, benign. No follow-up is recommended. No suspicious pulmonary nodules. No focal consolidation. Mild bilateral lower lobe atelectasis. No pleural effusion or pneumothorax. Musculoskeletal: Degenerative changes of the thoracic spine. Review of the MIP images confirms the above findings. CTA ABDOMEN AND PELVIS FINDINGS VASCULAR Aorta: No evidence of abdominal aortic aneurysm or dissection. Atherosclerotic calcifications of the aortic arch. Patent. Celiac: Patent.  Mild atherosclerotic calcifications at the origin. SMA: Patent. Renals: Patent bilaterally. Mild atherosclerotic calcifications at the origin on the left. IMA: Patent. Inflow: Patent bilaterally.  Atherosclerotic calcifications. Veins: Grossly unremarkable. Review of the MIP images confirms the above findings. NON-VASCULAR Hepatobiliary: Liver is within limits. Gallbladder is unremarkable. No intrahepatic or extrahepatic duct dilatation. Pancreas: Within normal limits. Spleen: Within normal limits. Adrenals/Urinary Tract: Adrenal glands are within normal limits. Kidneys are within normal limits.  No hydronephrosis. Bladder is underdistended but unremarkable. Stomach/Bowel: Stomach is notable for a tiny hiatal hernia. No evidence of bowel obstruction. Appendix is not discretely visualized. Sigmoid diverticulosis, without evidence of diverticulitis. Lymphatic: No suspicious abdominopelvic lymphadenopathy. Reproductive: Uterus is limits. Bilateral ovaries are within normal limits. Other: No abdominopelvic ascites. Musculoskeletal: Moderate superior endplate compression fracture deformity at L2. Status post PLIF  at L2-4. Degenerative changes of the lumbar spine. Review of the MIP images confirms the above findings. IMPRESSION: Isolated segmental/subsegmental right lower lobe pulmonary embolus. Overall clot burden is small. No evidence of right heart strain. No evidence of thoracoabdominal aortic aneurysm or dissection. Additional ancillary findings as above. Critical Value/emergent results were called by telephone at the time of interpretation on 07/11/2023 at 7:24 pm to provider Dr Criss Alvine, who verbally acknowledged these results. Electronically Signed   By: Charline Bills M.D.   On: 07/11/2023 19:26    Pending Labs Unresulted Labs (From admission, onward)     Start     Ordered   07/12/23 1800  Heparin level (unfractionated)  Once-Timed,   TIMED        07/12/23 0901   07/12/23 0500  CBC  Daily,   R      07/11/23 2231            Vitals/Pain Today's Vitals   07/12/23 0700 07/12/23 0924 07/12/23 1016 07/12/23 1306  BP: 132/77 (!) 142/119 133/67 134/77  Pulse:  88  72  Resp: 18 17  17   Temp: 98.8 F (37.1 C) 98.3 F (36.8 C)  98.2 F (36.8 C)  TempSrc:  Oral  Oral  SpO2:  100%  100%  Weight:      Height:      PainSc:        Isolation Precautions No active isolations  Medications Medications  sodium chloride flush (NS) 0.9 % injection  3 mL (3 mLs Intravenous Given 07/12/23 1020)  hydrOXYzine (ATARAX) tablet 10 mg (has no administration in time range)  0.9 % NaCl with KCl 40 mEq / L  infusion ( Intravenous New Bag/Given 07/12/23 1028)  acetaminophen (TYLENOL) tablet 650 mg (650 mg Oral Given 07/12/23 0827)    Or  acetaminophen (TYLENOL) suppository 650 mg ( Rectal See Alternative 07/12/23 0827)  ondansetron (ZOFRAN) tablet 4 mg ( Oral See Alternative 07/12/23 0150)    Or  ondansetron (ZOFRAN) injection 4 mg (4 mg Intravenous Given 07/12/23 0150)  senna-docusate (Senokot-S) tablet 1 tablet (has no administration in time range)  guaiFENesin (MUCINEX) 12 hr tablet 600 mg (600 mg Oral  Given 07/12/23 1016)  alum & mag hydroxide-simeth (MAALOX/MYLANTA) 200-200-20 MG/5ML suspension 30 mL (has no administration in time range)  famotidine (PEPCID) tablet 10 mg (10 mg Oral Given 07/12/23 1015)  amLODipine (NORVASC) tablet 10 mg (10 mg Oral Given 07/12/23 1016)  heparin ADULT infusion 100 units/mL (25000 units/260mL) (1,200 Units/hr Intravenous New Bag/Given 07/12/23 1011)  oxyCODONE (Oxy IR/ROXICODONE) immediate release tablet 5 mg (5 mg Oral Given 07/11/23 1624)  iohexol (OMNIPAQUE) 350 MG/ML injection 100 mL (100 mLs Intravenous Contrast Given 07/11/23 1709)  heparin bolus via infusion 4,000 Units (4,000 Units Intravenous Bolus from Bag 07/11/23 2354)    Mobility walks

## 2023-07-12 NOTE — Progress Notes (Signed)
ANTICOAGULATION CONSULT NOTE - Follow Up Consult  Pharmacy Consult for heparin Indication: acute pulmonary embolus  No Known Allergies  Patient Measurements: Height: 5\' 6"  (167.6 cm) Weight: 90.7 kg (200 lb) IBW/kg (Calculated) : 59.3 Heparin Dosing Weight: 79 kg  Vital Signs: Temp: 98.7 F (37.1 C) (09/17 1758) Temp Source: Oral (09/17 1306) BP: 149/84 (09/17 1758) Pulse Rate: 82 (09/17 1758)  Labs: Recent Labs    07/11/23 1228 07/11/23 1925 07/11/23 2339 07/12/23 0631 07/12/23 0821 07/12/23 1909  HGB 14.4 14.3  --  12.9  --   --   HCT 43.3 42.8  --  39.5  --   --   PLT 284 262  --  240  --   --   APTT  --   --  24  --   --   --   LABPROT  --   --  13.5  --   --   --   INR  --   --  1.0  --   --   --   HEPARINUNFRC  --   --   --   --  >1.10* 0.63  CREATININE 0.95 0.84  --  0.87  --   --   TROPONINIHS  --  3 4  --   --   --     Estimated Creatinine Clearance: 59.5 mL/min (by C-G formula based on SCr of 0.87 mg/dL).   Assessment: Patient is a 77 y.o F with recent COVID infection who presented to the ED on 07/11/23 with c/o abdominal pain, generalized weakness, cough and nausea. Chest CT on 07/11/23 showed acute RLL PE with no evidence of RHS. She is currently on heparin drip for VTE treatment.  Today, 07/12/2023: - PM heparin level = 0.63 units/mL, now therapeutic - CBC WNL - no bleeding or infusion issues noted per nursing  Goal of Therapy:  Heparin level 0.3-0.7 units/ml Monitor platelets by anticoagulation protocol: Yes   Plan:  - Continue heparin infusion at 1200 units/hr - Check heparin level in 8 hours to ensure remains within therapeutic range - Daily CBC, heparin level while on heparin infusion - Monitor closely for s/sx of bleeding - Noted plans to transition to Apixaban tomorrow     Greer Pickerel, PharmD, BCPS Clinical Pharmacist 07/12/2023 8:17 PM

## 2023-07-12 NOTE — Progress Notes (Signed)
PROGRESS NOTE    Mary Mullins  WUJ:[id number] DOB: 1945/11/27 DOA: 07/11/2023 PCP: Mattie Marlin, DO   Brief Narrative:  This 77 y.o. female with medical history significant for HTN who presented to the ED for evaluation of abdominal pain, nausea, heartburn, fatigue, cough. Patient tested positive for COVID-19 on 06/28/2023.  She has decreased oral intake and loss of taste and has eaten very little last week.  She has been having abdominal pain with heart reflux symptoms.  She also reports cough productive of clear sputum.  She has completed the course of azithromycin for sore throat.  Patient was seen in the ED on 9/12, CT A/P was negative for acute abnormality.  Patient was discharged.  Patient presented to the ED again with same symptoms.  CTA chest abdomen pelvis showed isolated segmental subsegmental right lower lobe pulmonary embolism.  No evidence of right heart strain.  Patient became symptomatic with lightheadedness and her saturation dropped to 85% while walking.  Patient was started on IV heparin and admitted for further evaluation.  Assessment & Plan:   Principal Problem:   Pulmonary embolism (HCC) Active Problems:   Hyponatremia   Hypertension   COVID-19 virus infection  Acute hypoxic respiratory failure: Segmental/subsegmental RLL pulmonary embolus: Patient presented with abdominal pain, nausea fatigue, cough.   She was COVID-positive on 07/25/2023. CTA chest shows pulmonary embolism, no evidence of right heart strain.  She was found to be hypoxic with SpO2 85% on room air while ambulating Saturating well while at rest.  Not sure small PE totally contributing to hypoxia.  Imaging is otherwise negative for pneumonia, effusion, inflammatory process.  Lungs clear on exam. Continue IV heparin, will transition to Eliquis tomorrow. Incentive spirometer, flutter valve Check pulse ox with ambulation in the morning   Hyponatremia: Could be due to dehydration from decreased  oral intake. Serum sodium improved with hydration.   Abdominal pain: Imaging is negative for acute pathology.  LFTs and lipase normal.   Nonspecific abdominal pain and diminished urine output.   Probably related to dehydration and poor oral intake over the last week.   UA not strongly suspicious for UTI. Continue IV fluid resuscitation Continue oral Pepcid, Maalox prn -Monitor UOP, bladder scan as needed   Recent COVID-19 viral infection: Tested positive 06/28/2023.  She has some lingering mild cough and has been dehydrated.   Imaging negative for pneumonia or inflammatory process.  Continue supportive care as above.   Hypertension: BP moderately elevated.   She has not been taking her antihypertensives over the last week due to feeling sick.   Resume amlodipine 10 mg daily.     DVT prophylaxis: Heparin IV Code Status: Full code Family Communication: No family at bed side Disposition Plan:   Status is: Inpatient Remains inpatient appropriate because: Admitted for pulmonary embolism requiring IV heparin.    Consultants:  None  Procedures: CTA chest/Abdomen/Pelvis  Antimicrobials:  Anti-infectives (From admission, onward)    None      Subjective: Patient was seen and examined at bedside.  Overnight events noted.  She feels weak and fatigued. Lying comfortably, denies any chest pain or shortness of breath.  Objective: Vitals:   07/12/23 0700 07/12/23 0924 07/12/23 1016 07/12/23 1306  BP: 132/77 (!) 142/119 133/67 134/77  Pulse:  88  72  Resp: 18 17  17   Temp: 98.8 F (37.1 C) 98.3 F (36.8 C)  98.2 F (36.8 C)  TempSrc:  Oral  Oral  SpO2:  100%  100%  Weight:      Height:        Intake/Output Summary (Last 24 hours) at 07/12/2023 1519 Last data filed at 07/12/2023 1010 Gross per 24 hour  Intake 177.14 ml  Output --  Net 177.14 ml   Filed Weights   07/11/23 1513  Weight: 90.7 kg    Examination:  General exam: Appears calm and comfortable ,  deconditioned, not in any acute distress. Respiratory system: Clear to auscultation. Respiratory effort normal. RR 15 Cardiovascular system: S1 & S2 heard, RRR. No JVD, murmurs. No pedal edema. Gastrointestinal system: Abdomen is nondistended, soft and nontender.  Normal bowel sounds heard. Central nervous system: Alert and oriented X3. No focal neurological deficits. Extremities: Symmetric 5 x 5 power. Skin: No rashes, lesions or ulcers Psychiatry: Judgement and insight appear normal. Mood & affect appropriate.     Data Reviewed: I have personally reviewed following labs and imaging studies  CBC: Recent Labs  Lab 07/07/23 0928 07/11/23 1228 07/11/23 1925 07/12/23 0631  WBC 2.9* 6.3 8.1 8.7  NEUTROABS 1.4*  --   --   --   HGB 13.3 14.4 14.3 12.9  HCT 39.8 43.3 42.8 39.5  MCV 89.4 90.0 88.4 90.4  PLT 222 284 262 240   Basic Metabolic Panel: Recent Labs  Lab 07/07/23 0928 07/11/23 1228 07/11/23 1925 07/12/23 0631  NA 134* 134* 130* 135  K 3.2* 3.8 3.5 3.8  CL 100 101 100 100  CO2 23 22 22 27   GLUCOSE 119* 134* 126* 118*  BUN 15 11 14 12   CREATININE 0.87 0.95 0.84 0.87  CALCIUM 8.4* 9.4 9.0 8.9  MG 2.2  --   --   --    GFR: Estimated Creatinine Clearance: 59.5 mL/min (by C-G formula based on SCr of 0.87 mg/dL). Liver Function Tests: Recent Labs  Lab 07/07/23 0928 07/11/23 1228 07/11/23 1925  AST 27 25 19   ALT 39 29 27  ALKPHOS 78 85 86  BILITOT 0.5 0.6 0.8  PROT 6.6 6.8 7.1  ALBUMIN 3.4* 3.7 3.9   Recent Labs  Lab 07/11/23 1228 07/11/23 1925  LIPASE 24 19   No results for input(s): "AMMONIA" in the last 168 hours. Coagulation Profile: Recent Labs  Lab 07/11/23 2339  INR 1.0   Cardiac Enzymes: No results for input(s): "CKTOTAL", "CKMB", "CKMBINDEX", "TROPONINI" in the last 168 hours. BNP (last 3 results) No results for input(s): "PROBNP" in the last 8760 hours. HbA1C: No results for input(s): "HGBA1C" in the last 72 hours. CBG: No results for  input(s): "GLUCAP" in the last 168 hours. Lipid Profile: No results for input(s): "CHOL", "HDL", "LDLCALC", "TRIG", "CHOLHDL", "LDLDIRECT" in the last 72 hours. Thyroid Function Tests: No results for input(s): "TSH", "T4TOTAL", "FREET4", "T3FREE", "THYROIDAB" in the last 72 hours. Anemia Panel: No results for input(s): "VITAMINB12", "FOLATE", "FERRITIN", "TIBC", "IRON", "RETICCTPCT" in the last 72 hours. Sepsis Labs: No results for input(s): "PROCALCITON", "LATICACIDVEN" in the last 168 hours.  No results found for this or any previous visit (from the past 240 hour(s)).   Radiology Studies: CT Angio Chest/Abd/Pel for Dissection W and/or Wo Contrast  Result Date: 07/11/2023 CLINICAL DATA:  COVID, strep throat, lower abdominal pain, urinary frequency EXAM: CT ANGIOGRAPHY CHEST, ABDOMEN AND PELVIS TECHNIQUE: Non-contrast CT of the chest was initially obtained. Multidetector CT imaging through the chest, abdomen and pelvis was performed using the standard protocol during bolus administration of intravenous contrast. Multiplanar reconstructed images and MIPs were obtained and reviewed to evaluate the vascular anatomy.  RADIATION DOSE REDUCTION: This exam was performed according to the departmental dose-optimization program which includes automated exposure control, adjustment of the mA and/or kV according to patient size and/or use of iterative reconstruction technique. CONTRAST:  OMNIPAQUE IOHEXOL 350 MG/ML SOLN COMPARISON:  CT abdomen/pelvis dated 07/07/2023 FINDINGS: CTA CHEST FINDINGS Cardiovascular: On unenhanced CT, there is no evidence of intramural hematoma. Following contrast administration, there is no evidence of thoracic aortic aneurysm or dissection. Atherosclerotic calcifications of the aortic arch. Although not tailored for evaluation of the pulmonary arteries, there is satisfactory evaluation of the bilateral pulmonary arteries to the segmental level. Isolated segmental/subsegmental  right lower lobe pulmonary embolus (series 10/image 77). Overall clot burden is small. The heart is top-normal in size. No pericardial effusion. No findings suspicious for right heart strain. Mediastinum/Nodes: No suspicious mediastinal lymphadenopathy. Visualized thyroid is unremarkable. Lungs/Pleura: Lungs are clear. 8 x 4 mm triangular subpleural nodule in the left lower lobe (series 6 image 114), favoring a triangular subpleural lymph node, benign. No follow-up is recommended. No suspicious pulmonary nodules. No focal consolidation. Mild bilateral lower lobe atelectasis. No pleural effusion or pneumothorax. Musculoskeletal: Degenerative changes of the thoracic spine. Review of the MIP images confirms the above findings. CTA ABDOMEN AND PELVIS FINDINGS VASCULAR Aorta: No evidence of abdominal aortic aneurysm or dissection. Atherosclerotic calcifications of the aortic arch. Patent. Celiac: Patent.  Mild atherosclerotic calcifications at the origin. SMA: Patent. Renals: Patent bilaterally. Mild atherosclerotic calcifications at the origin on the left. IMA: Patent. Inflow: Patent bilaterally.  Atherosclerotic calcifications. Veins: Grossly unremarkable. Review of the MIP images confirms the above findings. NON-VASCULAR Hepatobiliary: Liver is within limits. Gallbladder is unremarkable. No intrahepatic or extrahepatic duct dilatation. Pancreas: Within normal limits. Spleen: Within normal limits. Adrenals/Urinary Tract: Adrenal glands are within normal limits. Kidneys are within normal limits.  No hydronephrosis. Bladder is underdistended but unremarkable. Stomach/Bowel: Stomach is notable for a tiny hiatal hernia. No evidence of bowel obstruction. Appendix is not discretely visualized. Sigmoid diverticulosis, without evidence of diverticulitis. Lymphatic: No suspicious abdominopelvic lymphadenopathy. Reproductive: Uterus is limits. Bilateral ovaries are within normal limits. Other: No abdominopelvic ascites.  Musculoskeletal: Moderate superior endplate compression fracture deformity at L2. Status post PLIF at L2-4. Degenerative changes of the lumbar spine. Review of the MIP images confirms the above findings. IMPRESSION: Isolated segmental/subsegmental right lower lobe pulmonary embolus. Overall clot burden is small. No evidence of right heart strain. No evidence of thoracoabdominal aortic aneurysm or dissection. Additional ancillary findings as above. Critical Value/emergent results were called by telephone at the time of interpretation on 07/11/2023 at 7:24 pm to provider Dr Criss Alvine, who verbally acknowledged these results. Electronically Signed   By: Charline Bills M.D.   On: 07/11/2023 19:26    Scheduled Meds:  amLODipine  10 mg Oral Daily   famotidine  10 mg Oral BID   guaiFENesin  600 mg Oral BID   sodium chloride flush  3 mL Intravenous Q12H   Continuous Infusions:  heparin 1,200 Units/hr (07/12/23 1011)     LOS: 0 days    Time spent: 50 mins    Willeen Niece, MD [facility]   If 7PM-7AM, please contact night-coverage

## 2023-07-13 ENCOUNTER — Other Ambulatory Visit (HOSPITAL_COMMUNITY): Payer: Self-pay

## 2023-07-13 DIAGNOSIS — I2693 Single subsegmental pulmonary embolism without acute cor pulmonale: Secondary | ICD-10-CM | POA: Diagnosis not present

## 2023-07-13 MED ORDER — RIVAROXABAN (XARELTO) VTE STARTER PACK (15 & 20 MG)
ORAL_TABLET | ORAL | 0 refills | Status: DC
  Filled 2023-07-13: qty 51, 30d supply, fill #0

## 2023-07-13 MED ORDER — RIVAROXABAN (XARELTO) VTE STARTER PACK (15 & 20 MG): ORAL_TABLET | ORAL | 0 refills | Status: DC

## 2023-07-13 MED ORDER — RIVAROXABAN 15 MG PO TABS
15.0000 mg | ORAL_TABLET | Freq: Two times a day (BID) | ORAL | Status: DC
  Administered 2023-07-13: 15 mg via ORAL
  Filled 2023-07-13: qty 1

## 2023-07-13 MED ORDER — RIVAROXABAN 20 MG PO TABS: 20.0000 mg | ORAL_TABLET | Freq: Every day | ORAL | Status: DC

## 2023-07-13 NOTE — Progress Notes (Addendum)
I discussed / reviewed the pharmacy note by Dr. Glenetta Hew, PharmD candidate, and I agree with the student's findings and plans as documented. - Copay $47  Mary Mullins S. Mary Mullins, PharmD, BCPS Clinical Staff Pharmacist [email or web address]    ANTICOAGULATION CONSULT NOTE - Initial Consult  Pharmacy Consult for Heparin >> Xarelto Indication: acute pulmonary embolus  No Known Allergies  Patient Measurements: Height: 5\' 6"  (167.6 cm) Weight: 90.7 kg (200 lb) IBW/kg (Calculated) : 59.3  Vital Signs: Temp: 98.1 F (36.7 C) (09/18 0501) Temp Source: Oral (09/18 0047) BP: 137/81 (09/18 0501) Pulse Rate: 71 (09/18 0501)  Labs: Recent Labs    07/11/23 1228 07/11/23 1925 07/11/23 2339 07/12/23 0631 07/12/23 0821 07/12/23 1909 07/13/23 0557  HGB 14.4 14.3  --  12.9  --   --  12.1  HCT 43.3 42.8  --  39.5  --   --  38.1  PLT 284 262  --  240  --   --  210  APTT  --   --  24  --   --   --   --   LABPROT  --   --  13.5  --   --   --   --   INR  --   --  1.0  --   --   --   --   HEPARINUNFRC  --   --   --   --  >1.10* 0.63 0.71*  CREATININE 0.95 0.84  --  0.87  --   --   --   TROPONINIHS  --  3 4  --   --   --   --     Estimated Creatinine Clearance: 59.5 mL/min (by C-G formula based on SCr of 0.87 mg/dL).   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 Chest CT on 07/11/23 showed acute RLL PE with no evidence of RHS. Has been on heparin drip for VTE treatment and now transitioning to Xarelto.  Today, 07/13/23: - HL 0.71, slightly supratherapeutic - CBC WNL - no bleeding or  infusion issues noted per nursing  Goal of Therapy:  Therapeutic oral anticoagulation Monitor platelets by anticoagulation protocol: Yes   Plan: - Stop heparin drip at the time first dose of Xarelto is administered  - Xarelto 15 mg PO BID with food x 21 days followed by 20 mg PO once daily with largest meal of the day  Mary Mullins 07/13/2023,7:51 AM

## 2023-07-13 NOTE — TOC Benefit Eligibility Note (Signed)
Patient Product/process development scientist completed.    The patient is insured through [employer]/ [employer]. Patient has Medicare and is not eligible for a copay card, but may be able to apply for patient assistance, if available.    Ran test claim for Xarelto 20 mg and the current 30 day co-pay is $47.00.   This test claim was processed through [facility]- copay amounts may vary at other pharmacies due to pharmacy/plan contracts, or as the patient moves through the different stages of their insurance plan.     Roland Earl, CPHT Pharmacy Technician III Certified Patient Advocate [facility] Patient Advocate Team Direct Number: [phone]  Fax: [phone]

## 2023-07-13 NOTE — Plan of Care (Signed)

## 2023-07-13 NOTE — Plan of Care (Signed)

## 2023-07-13 NOTE — Discharge Instructions (Addendum)
Information on my medicine - XARELTO (rivaroxaban)  This medication education was reviewed with me or my healthcare representative as part of my discharge preparation.  WHY WAS XARELTO PRESCRIBED FOR YOU? Xarelto was prescribed to treat blood clots that may have been found in the veins of your legs (deep vein thrombosis) or in your lungs (pulmonary embolism) and to reduce the risk of them occurring again.  What do you need to know about Xarelto? The starting dose is one 15 mg tablet taken TWICE daily with food for the FIRST 21 DAYS then on 08/03/23  the dose is changed to one 20 mg tablet taken ONCE A DAY with your evening meal.  DO NOT stop taking Xarelto without talking to the health care provider who prescribed the medication.  Refill your prescription for 20 mg tablets before you run out.  After discharge, you should have regular check-up appointments with your healthcare provider that is prescribing your Xarelto.  In the future your dose may need to be changed if your kidney function changes by a significant amount.  What do you do if you miss a dose? If you are taking Xarelto TWICE DAILY and you miss a dose, take it as soon as you remember. You may take two 15 mg tablets (total 30 mg) at the same time then resume your regularly scheduled 15 mg twice daily the next day.  If you are taking Xarelto ONCE DAILY and you miss a dose, take it as soon as you remember on the same day then continue your regularly scheduled once daily regimen the next day. Do not take two doses of Xarelto at the same time.   Important Safety Information Xarelto is a blood thinner medicine that can cause bleeding. You should call your healthcare provider right away if you experience any of the following: Bleeding from an injury or your nose that does not stop. Unusual colored urine (red or dark brown) or unusual colored stools (red or black). Unusual bruising for unknown reasons. A serious fall or if you  hit your head (even if there is no bleeding).  Some medicines may interact with Xarelto and might increase your risk of bleeding while on Xarelto. To help avoid this, consult your healthcare provider or pharmacist prior to using any new prescription or non-prescription medications, including herbals, vitamins, non-steroidal anti-inflammatory drugs (NSAIDs) and supplements.  This website has more information on Xarelto: [email or web address].[employer].

## 2023-07-13 NOTE — Progress Notes (Signed)
ANTICOAGULATION CONSULT NOTE - Follow Up Consult  Pharmacy Consult for heparin Indication: acute pulmonary embolus  No Known Allergies  Patient Measurements: Height: 5\' 6"  (167.6 cm) Weight: 90.7 kg (200 lb) IBW/kg (Calculated) : 59.3 Heparin Dosing Weight: 79 kg  Vital Signs: Temp: 98.1 F (36.7 C) (09/18 0501) Temp Source: Oral (09/18 0047) BP: 137/81 (09/18 0501) Pulse Rate: 71 (09/18 0501)  Labs: Recent Labs    07/11/23 1228 07/11/23 1925 07/11/23 2339 07/12/23 0631 07/12/23 0821 07/12/23 1909 07/13/23 0557  HGB 14.4 14.3  --  12.9  --   --  12.1  HCT 43.3 42.8  --  39.5  --   --  38.1  PLT 284 262  --  240  --   --  210  APTT  --   --  24  --   --   --   --   LABPROT  --   --  13.5  --   --   --   --   INR  --   --  1.0  --   --   --   --   HEPARINUNFRC  --   --   --   --  >1.10* 0.63 0.71*  CREATININE 0.95 0.84  --  0.87  --   --   --   TROPONINIHS  --  3 4  --   --   --   --     Estimated Creatinine Clearance: 59.5 mL/min (by C-G formula based on SCr of 0.87 mg/dL).   Assessment: Patient is a 77 y.o F with recent COVID infection who presented to the ED on 07/11/23 with c/o abdominal pain, generalized weakness, cough and nausea. Chest CT on 07/11/23 showed acute RLL PE with no evidence of RHS. She is currently on heparin drip for VTE treatment.  Today, 07/13/2023: - PM heparin level = 0.71 units/mL, slightly  supratherapeutic - CBC WNL - no bleeding or infusion issues noted per nursing  Goal of Therapy:  Heparin level 0.3-0.7 units/ml Monitor platelets by anticoagulation protocol: Yes   Plan:  - Decrease  heparin infusion to 1100 units/hr - Check heparin level in 8 hours after rate change - Daily CBC, heparin level while on heparin infusion - Monitor closely for s/sx of bleeding - Noted plans to transition to Apixaban today    Adalberto Cole, PharmD, BCPS 07/13/2023 6:56 AM

## 2023-07-13 NOTE — Progress Notes (Signed)
   07/13/23 0908  TOC Brief Assessment  Insurance and Status Reviewed  Patient has primary care physician Yes  Home environment has been reviewed Home w/ spouse  Prior level of function: Independent  Prior/Current Home Services No current home services  Social Determinants of Health Reivew SDOH reviewed no interventions necessary  Readmission risk has been reviewed Yes  Transition of care needs no transition of care needs at this time

## 2023-07-13 NOTE — Progress Notes (Signed)
Physician Discharge Summary  Mary Mullins ZOX:[id number] DOB: [birth year] DOA: 07/11/2023  PCP: Mattie Marlin, DO  Admit date: 07/11/2023 Discharge date: 07/13/2023  Time spent:  Recommendations for Outpatient Follow-up:  PCP in 1 week   Discharge Diagnoses:  Principal Problem:   Pulmonary embolism (HCC) Active Problems:   Hyponatremia   Hypertension   COVID-19 virus infection   Discharge Condition: Improved  Diet recommendation: Low-sodium  Filed Weights   07/11/23 1513  Weight: 90.7 kg    History of present illness:  Mary Mullins is a 77 y.o. female with medical history significant for HTN who presented to the ED for evaluation of abdominal pain, nausea, heartburn, fatigue, cough.   Patient tested positive for COVID-19 on 06/28/2023.  Since then she has had poor oral intake and loss of taste.  She has not been eating much these last 2 weeks and states has eaten almost nothing the last week.  She has not been able to keep up with fluid hydration.  She had been having abdominal pain with heartburn/reflux symptoms.  She reports diminished urine output.  She has cough productive of clear sputum.  1 week ago she was treated with a course of azithromycin for sore throat.   She was seen at [facility], ED on 9/12 for her symptoms.  CT abdomen/pelvis with contrast was negative for any acute abdominopelvic abnormality.  She also underwent MRI L-spine which showed unchanged lumbar disc and facet degeneration, most notable at L4-5 where there is moderate spinal stenosis.   She says she was seen at urgent care yesterday and diagnosed with "strep in the stomach."  She was prescribed Augmentin.   She says while in the triage area she developed sweats.  She says this happened again during her CT scan after receiving contrast.  She says she developed sweats and feeling of pain in her stomach.  Hospital Course:   Segmental/subsegmental RLL pulmonary embolus: Seen on CT  imaging with small clot burden, no evidence of right heart strain.  -Treated with IV heparin, transition to oral Xarelto, discharged home in a stable condition -Recommend anticoagulation for 3 months as this is likely provoked from recent COVID   Hyponatremia: Improved with hydration   Abdominal pain: Imaging is negative for acute pathology.  LFTs and lipase normal.  Likely secondary to recent antibiotics and COVID, improved with supportive care   Recent COVID-19 viral infection: Tested positive 06/28/2023.  Has some lingering mild cough and has been dehydrated.  Imaging negative for pneumonia or inflammatory process.  Supportive care   Hypertension: Continue loaded pain  Discharge Exam: Vitals:   07/13/23 0047 07/13/23 0501  BP: 123/72 137/81  Pulse: 76 71  Resp: 18   Temp: 98.5 F (36.9 C) 98.1 F (36.7 C)  SpO2: 97% 98%    Gen: Awake, Alert, Oriented X 3,  HEENT: no JVD Lungs: Good air movement bilaterally, CTAB CVS: S1S2/RRR Abd: soft, Non tender, non distended, BS present Extremities: No edema Skin: no new rashes on exposed skin   Discharge Instructions   Discharge Instructions     Diet - low sodium heart healthy   Complete by: As directed    Increase activity slowly   Complete by: As directed       Allergies as of 07/13/2023   No Known Allergies      Medication List     STOP taking these medications    amoxicillin-clavulanate 875-125 MG tablet Commonly known as: AUGMENTIN   azithromycin  250 MG tablet Commonly known as: ZITHROMAX   potassium chloride SA 20 MEQ tablet Commonly known as: KLOR-CON M       TAKE these medications    albuterol 108 (90 Base) MCG/ACT inhaler Commonly known as: VENTOLIN HFA Inhale 2 puffs into the lungs every 6 (six) hours as needed for wheezing or shortness of breath.   amLODipine 10 MG tablet Commonly known as: NORVASC Take 10 mg by mouth daily.   benzonatate 200 MG capsule Commonly known as: TESSALON Take  200 mg by mouth 2 (two) times daily as needed for cough.   HYDROcodone bit-homatropine 5-1.5 MG/5ML syrup Commonly known as: HYCODAN Take 5 mLs by mouth every 6 (six) hours as needed.   Rivaroxaban Starter Pack (15 mg and 20 mg) Commonly known as: XARELTO STARTER PACK Follow package directions: Take one 15mg  tablet by mouth twice a day. On day 22, switch to one 20mg  tablet once a day. Take with food.       No Known Allergies    The results of significant diagnostics from this hospitalization (including imaging, microbiology, ancillary and laboratory) are listed below for reference.    Significant Diagnostic Studies: CT Angio Chest/Abd/Pel for Dissection W and/or Wo Contrast  Result Date: 07/11/2023 CLINICAL DATA:  COVID, strep throat, lower abdominal pain, urinary frequency EXAM: CT ANGIOGRAPHY CHEST, ABDOMEN AND PELVIS TECHNIQUE: Non-contrast CT of the chest was initially obtained. Multidetector CT imaging through the chest, abdomen and pelvis was performed using the standard protocol during bolus administration of intravenous contrast. Multiplanar reconstructed images and MIPs were obtained and reviewed to evaluate the vascular anatomy. RADIATION DOSE REDUCTION: This exam was performed according to the departmental dose-optimization program which includes automated exposure control, adjustment of the mA and/or kV according to patient size and/or use of iterative reconstruction technique. CONTRAST:  OMNIPAQUE IOHEXOL 350 MG/ML SOLN COMPARISON:  CT abdomen/pelvis dated 07/07/2023 FINDINGS: CTA CHEST FINDINGS Cardiovascular: On unenhanced CT, there is no evidence of intramural hematoma. Following contrast administration, there is no evidence of thoracic aortic aneurysm or dissection. Atherosclerotic calcifications of the aortic arch. Although not tailored for evaluation of the pulmonary arteries, there is satisfactory evaluation of the bilateral pulmonary arteries to the segmental level.  Isolated segmental/subsegmental right lower lobe pulmonary embolus (series 10/image 77). Overall clot burden is small. The heart is top-normal in size. No pericardial effusion. No findings suspicious for right heart strain. Mediastinum/Nodes: No suspicious mediastinal lymphadenopathy. Visualized thyroid is unremarkable. Lungs/Pleura: Lungs are clear. 8 x 4 mm triangular subpleural nodule in the left lower lobe (series 6 image 114), favoring a triangular subpleural lymph node, benign. No follow-up is recommended. No suspicious pulmonary nodules. No focal consolidation. Mild bilateral lower lobe atelectasis. No pleural effusion or pneumothorax. Musculoskeletal: Degenerative changes of the thoracic spine. Review of the MIP images confirms the above findings. CTA ABDOMEN AND PELVIS FINDINGS VASCULAR Aorta: No evidence of abdominal aortic aneurysm or dissection. Atherosclerotic calcifications of the aortic arch. Patent. Celiac: Patent.  Mild atherosclerotic calcifications at the origin. SMA: Patent. Renals: Patent bilaterally. Mild atherosclerotic calcifications at the origin on the left. IMA: Patent. Inflow: Patent bilaterally.  Atherosclerotic calcifications. Veins: Grossly unremarkable. Review of the MIP images confirms the above findings. NON-VASCULAR Hepatobiliary: Liver is within limits. Gallbladder is unremarkable. No intrahepatic or extrahepatic duct dilatation. Pancreas: Within normal limits. Spleen: Within normal limits. Adrenals/Urinary Tract: Adrenal glands are within normal limits. Kidneys are within normal limits.  No hydronephrosis. Bladder is underdistended but unremarkable. Stomach/Bowel: Stomach is notable for  a tiny hiatal hernia. No evidence of bowel obstruction. Appendix is not discretely visualized. Sigmoid diverticulosis, without evidence of diverticulitis. Lymphatic: No suspicious abdominopelvic lymphadenopathy. Reproductive: Uterus is limits. Bilateral ovaries are within normal limits. Other: No  abdominopelvic ascites. Musculoskeletal: Moderate superior endplate compression fracture deformity at L2. Status post PLIF at L2-4. Degenerative changes of the lumbar spine. Review of the MIP images confirms the above findings. IMPRESSION: Isolated segmental/subsegmental right lower lobe pulmonary embolus. Overall clot burden is small. No evidence of right heart strain. No evidence of thoracoabdominal aortic aneurysm or dissection. Additional ancillary findings as above. Critical Value/emergent results were called by telephone at the time of interpretation on 07/11/2023 at 7:24 pm to provider Dr Criss Alvine, who verbally acknowledged these results. Electronically Signed   By: Charline Bills M.D.   On: 07/11/2023 19:26   MR LUMBAR SPINE WO CONTRAST  Result Date: 07/07/2023 CLINICAL DATA:  Low back pain, cauda equina syndrome suspected. Low back pain. History of lumbar surgery. EXAM: MRI LUMBAR SPINE WITHOUT CONTRAST TECHNIQUE: Multiplanar, multisequence MR imaging of the lumbar spine was performed. No intravenous contrast was administered. COMPARISON:  Lumbar spine MRI 03/13/2023 FINDINGS: Segmentation:  Standard. Alignment: Mild lumbar levoscoliosis. Unchanged grade 1 retrolisthesis of L2 on L3 and grade 1 anterolisthesis of L4 on L5. Vertebrae: Unchanged chronic L2 compression fracture. No acute fracture, suspicious marrow lesion, or significant marrow edema. Prior L2-L4 posterior fusion. Conus medullaris and cauda equina: Conus extends to the L2 level. Conus and cauda equina appear normal. Paraspinal and other soft tissues: Postoperative changes in the posterior lumbar soft tissues including an unchanged 4 cm collection in the laminectomy bed at L3-4, possibly a chronic seroma. Disc levels: Disc desiccation in up to mild disc space narrowing throughout the lumbar spine. T12-L1: Negative. L1-2: Mild disc bulging and mild facet hypertrophy without stenosis, unchanged. L2-3: Prior posterior decompression and  fusion. Right eccentric disc bulging, endplate spurring, and moderate facet hypertrophy result in mild right neural foraminal stenosis without spinal stenosis, unchanged. L3-4: Prior posterior decompression and fusion. Disc bulging and moderate facet hypertrophy result in minimal to mild bilateral neural foraminal stenosis without spinal stenosis, unchanged. L4-5: Anterolisthesis with bulging uncovered disc and severe facet and ligamentum flavum hypertrophy result in moderate spinal stenosis and mild bilateral neural foraminal stenosis, unchanged. L5-S1: Mild disc bulging and severe facet hypertrophy result in mild left neural foraminal stenosis without spinal stenosis, unchanged. IMPRESSION: Unchanged lumbar disc and facet degeneration, most notable at L4-5 where there is moderate spinal stenosis. Electronically Signed   By: Sebastian Ache M.D.   On: 07/07/2023 15:23   CT ABDOMEN PELVIS W CONTRAST  Result Date: 07/07/2023 CLINICAL DATA:  Abdominal pain, acute, nonlocalized. Urinary retention. EXAM: CT ABDOMEN AND PELVIS WITH CONTRAST TECHNIQUE: Multidetector CT imaging of the abdomen and pelvis was performed using the standard protocol following bolus administration of intravenous contrast. RADIATION DOSE REDUCTION: This exam was performed according to the departmental dose-optimization program which includes automated exposure control, adjustment of the mA and/or kV according to patient size and/or use of iterative reconstruction technique. CONTRAST:  OMNIPAQUE IOHEXOL 300 MG/ML  SOLN COMPARISON:  None Available. FINDINGS: Lower chest: No acute abnormality. Hepatobiliary: No focal liver abnormality is seen. No gallstones, gallbladder wall thickening, or biliary dilatation. Pancreas: Unremarkable. No pancreatic ductal dilatation or surrounding inflammatory changes. Spleen: Normal in size without focal abnormality. Adrenals/Urinary Tract: Adrenal glands are unremarkable. Kidneys are normal, without renal  calculi, focal lesion, or hydronephrosis. Bladder is decompressed. Stomach/Bowel: Stomach is within  normal limits. Appendix appears normal. Sigmoid diverticulosis. No evidence of bowel wall thickening, distention, or inflammatory changes. Vascular/Lymphatic: Aortic atherosclerosis. No enlarged abdominal or pelvic lymph nodes. Reproductive: Uterus and bilateral adnexa are unremarkable. Other: No abdominal wall hernia or abnormality. No abdominopelvic ascites. Musculoskeletal: Prior L2-L4 laminectomy and posterior spinal fusion. 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 IMPRESSION: 1. No acute abdominopelvic abnormality. 2. Sigmoid diverticulosis without evidence of acute diverticulitis. 3. Prior L2-L4 laminectomy and posterior spinal fusion. 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 Aortic Atherosclerosis (ICD10-I70.0). Electronically Signed   By: Orvan Falconer M.D.   On: 07/07/2023 12:30    Microbiology: No results found for this or any previous visit (from the past 240 hour(s)).   Labs: Basic Metabolic Panel: Recent Labs  Lab 07/07/23 0928 07/11/23 1228 07/11/23 1925 07/12/23 0631  NA 134* 134* 130* 135  K 3.2* 3.8 3.5 3.8  CL 100 101 100 100  CO2 23 22 22 27   GLUCOSE 119* 134* 126* 118*  BUN 15 11 14 12   CREATININE 0.87 0.95 0.84 0.87  CALCIUM 8.4* 9.4 9.0 8.9  MG 2.2  --   --   --    Liver Function Tests: Recent Labs  Lab 07/07/23 0928 07/11/23 1228 07/11/23 1925  AST 27 25 19   ALT 39 29 27  ALKPHOS 78 85 86  BILITOT 0.5 0.6 0.8  PROT 6.6 6.8 7.1  ALBUMIN 3.4* 3.7 3.9   Recent Labs  Lab 07/11/23 1228 07/11/23 1925  LIPASE 24 19   No results for input(s): "AMMONIA" in the last 168 hours. CBC: Recent Labs  Lab 07/07/23 0928 07/11/23 1228 07/11/23 1925 07/12/23 0631 07/13/23 0557  WBC 2.9* 6.3 8.1 8.7 5.0  NEUTROABS 1.4*  --   --   --   --   HGB 13.3 14.4 14.3 12.9 12.1  HCT 39.8 43.3 42.8 39.5 38.1  MCV 89.4 90.0 88.4  90.4 93.8  PLT 222 284 262 240 210   Cardiac Enzymes: No results for input(s): "CKTOTAL", "CKMB", "CKMBINDEX", "TROPONINI" in the last 168 hours. BNP: BNP (last 3 results) No results for input(s): "BNP" in the last 8760 hours.  ProBNP (last 3 results) No results for input(s): "PROBNP" in the last 8760 hours.  CBG: No results for input(s): "GLUCAP" in the last 168 hours.     Signed:  Zannie Cove MD.  [facility] 07/13/2023, 11:02 AM

## 2023-07-15 ENCOUNTER — Encounter (HOSPITAL_COMMUNITY): Admission: RE | Payer: Self-pay | Source: Home / Self Care

## 2023-07-15 ENCOUNTER — Ambulatory Visit (HOSPITAL_COMMUNITY): Admission: RE | Admit: 2023-07-15 | Payer: PPO | Source: Home / Self Care | Admitting: Orthopaedic Surgery

## 2023-07-15 DIAGNOSIS — M1612 Unilateral primary osteoarthritis, left hip: Secondary | ICD-10-CM

## 2023-07-15 SURGERY — ARTHROPLASTY, HIP, TOTAL, ANTERIOR APPROACH
Anesthesia: Choice | Site: Hip | Laterality: Left

## 2023-07-22 NOTE — Discharge Summary (Signed)
Physician Discharge Summary  Mary Mullins BJY:[id number] DOB: 1946-05-14 DOA: 07/11/2023   PCP: Mattie Marlin, DO   Admit date: 07/11/2023 Discharge date: 07/13/2023   Time spent:   Recommendations for Outpatient Follow-up:  PCP in 1 week     Discharge Diagnoses:  Principal Problem:   Pulmonary embolism (HCC) Active Problems:   Hyponatremia   Hypertension   COVID-19 virus infection     Discharge Condition: Improved   Diet recommendation: Low-sodium      Filed Weights    07/11/23 1513  Weight: 90.7 kg      History of present illness:  Mary Mullins is a 77 y.o. female with medical history significant for HTN who presented to the ED for evaluation of abdominal pain, nausea, heartburn, fatigue, cough.   Patient tested positive for COVID-19 on 06/28/2023.  Since then she has had poor oral intake and loss of taste.  She has not been eating much these last 2 weeks and states has eaten almost nothing the last week.  She has not been able to keep up with fluid hydration.  She had been having abdominal pain with heartburn/reflux symptoms.  She reports diminished urine output.  She has cough productive of clear sputum.  1 week ago she was treated with a course of azithromycin for sore throat.   She was seen at [facility] ED on 9/12 for her symptoms.  CT abdomen/pelvis with contrast was negative for any acute abdominopelvic abnormality.  She also underwent MRI L-spine which showed unchanged lumbar disc and facet degeneration, most notable at L4-5 where there is moderate spinal stenosis.   She says she was seen at urgent care yesterday and diagnosed with "strep in the stomach."  She was prescribed Augmentin.   She says while in the triage area she developed sweats.  She says this happened again during her CT scan after receiving contrast.  She says she developed sweats and feeling of pain in her stomach.   Hospital Course:    Segmental/subsegmental RLL pulmonary  embolus: Seen on CT imaging with small clot burden, no evidence of right heart strain.  -Treated with IV heparin, transition to oral Xarelto, discharged home in a stable condition -Recommend anticoagulation for 3 months as this is likely provoked from recent COVID   Hyponatremia: Improved with hydration   Abdominal pain: Imaging is negative for acute pathology.  LFTs and lipase normal.  Likely secondary to recent antibiotics and COVID, improved with supportive care   Recent COVID-19 viral infection: Tested positive 06/28/2023.  Has some lingering mild cough and has been dehydrated.  Imaging negative for pneumonia or inflammatory process.  Supportive care   Hypertension: Continue loaded pain   Discharge Exam:     Vitals:    07/13/23 0047 07/13/23 0501  BP: 123/72 137/81  Pulse: 76 71  Resp: 18    Temp: 98.5 F (36.9 C) 98.1 F (36.7 C)  SpO2: 97% 98%      Gen: Awake, Alert, Oriented X 3,  HEENT: no JVD Lungs: Good air movement bilaterally, CTAB CVS: S1S2/RRR Abd: soft, Non tender, non distended, BS present Extremities: No edema Skin: no new rashes on exposed skin    Discharge Instructions     Discharge Instructions       Diet - low sodium heart healthy   Complete by: As directed      Increase activity slowly   Complete by: As directed  Allergies as of 07/13/2023   No Known Allergies         Medication List       STOP taking these medications     amoxicillin-clavulanate 875-125 MG tablet Commonly known as: AUGMENTIN    azithromycin 250 MG tablet Commonly known as: ZITHROMAX    potassium chloride SA 20 MEQ tablet Commonly known as: KLOR-CON M           TAKE these medications     albuterol 108 (90 Base) MCG/ACT inhaler Commonly known as: VENTOLIN HFA Inhale 2 puffs into the lungs every 6 (six) hours as needed for wheezing or shortness of breath.    amLODipine 10 MG tablet Commonly known as: NORVASC Take 10 mg by mouth daily.     benzonatate 200 MG capsule Commonly known as: TESSALON Take 200 mg by mouth 2 (two) times daily as needed for cough.    HYDROcodone bit-homatropine 5-1.5 MG/5ML syrup Commonly known as: HYCODAN Take 5 mLs by mouth every 6 (six) hours as needed.    Rivaroxaban Starter Pack (15 mg and 20 mg) Commonly known as: XARELTO STARTER PACK Follow package directions: Take one 15mg  tablet by mouth twice a day. On day 22, switch to one 20mg  tablet once a day. Take with food.           Allergies  No Known Allergies         The results of significant diagnostics from this hospitalization (including imaging, microbiology, ancillary and laboratory) are listed below for reference.     Significant Diagnostic Studies:  Imaging Results  CT Angio Chest/Abd/Pel for Dissection W and/or Wo Contrast   Result Date: 07/11/2023 CLINICAL DATA:  COVID, strep throat, lower abdominal pain, urinary frequency EXAM: CT ANGIOGRAPHY CHEST, ABDOMEN AND PELVIS TECHNIQUE: Non-contrast CT of the chest was initially obtained. Multidetector CT imaging through the chest, abdomen and pelvis was performed using the standard protocol during bolus administration of intravenous contrast. Multiplanar reconstructed images and MIPs were obtained and reviewed to evaluate the vascular anatomy. RADIATION DOSE REDUCTION: This exam was performed according to the departmental dose-optimization program which includes automated exposure control, adjustment of the mA and/or kV according to patient size and/or use of iterative reconstruction technique. CONTRAST:  OMNIPAQUE IOHEXOL 350 MG/ML SOLN COMPARISON:  CT abdomen/pelvis dated 07/07/2023 FINDINGS: CTA CHEST FINDINGS Cardiovascular: On unenhanced CT, there is no evidence of intramural hematoma. Following contrast administration, there is no evidence of thoracic aortic aneurysm or dissection. Atherosclerotic calcifications of the aortic arch. Although not tailored for evaluation of the  pulmonary arteries, there is satisfactory evaluation of the bilateral pulmonary arteries to the segmental level. Isolated segmental/subsegmental right lower lobe pulmonary embolus (series 10/image 77). Overall clot burden is small. The heart is top-normal in size. No pericardial effusion. No findings suspicious for right heart strain. Mediastinum/Nodes: No suspicious mediastinal lymphadenopathy. Visualized thyroid is unremarkable. Lungs/Pleura: Lungs are clear. 8 x 4 mm triangular subpleural nodule in the left lower lobe (series 6 image 114), favoring a triangular subpleural lymph node, benign. No follow-up is recommended. No suspicious pulmonary nodules. No focal consolidation. Mild bilateral lower lobe atelectasis. No pleural effusion or pneumothorax. Musculoskeletal: Degenerative changes of the thoracic spine. Review of the MIP images confirms the above findings. CTA ABDOMEN AND PELVIS FINDINGS VASCULAR Aorta: No evidence of abdominal aortic aneurysm or dissection. Atherosclerotic calcifications of the aortic arch. Patent. Celiac: Patent.  Mild atherosclerotic calcifications at the origin. SMA: Patent. Renals: Patent bilaterally. Mild atherosclerotic calcifications at the origin  on the left. IMA: Patent. Inflow: Patent bilaterally.  Atherosclerotic calcifications. Veins: Grossly unremarkable. Review of the MIP images confirms the above findings. NON-VASCULAR Hepatobiliary: Liver is within limits. Gallbladder is unremarkable. No intrahepatic or extrahepatic duct dilatation. Pancreas: Within normal limits. Spleen: Within normal limits. Adrenals/Urinary Tract: Adrenal glands are within normal limits. Kidneys are within normal limits.  No hydronephrosis. Bladder is underdistended but unremarkable. Stomach/Bowel: Stomach is notable for a tiny hiatal hernia. No evidence of bowel obstruction. Appendix is not discretely visualized. Sigmoid diverticulosis, without evidence of diverticulitis. Lymphatic: No suspicious  abdominopelvic lymphadenopathy. Reproductive: Uterus is limits. Bilateral ovaries are within normal limits. Other: No abdominopelvic ascites. Musculoskeletal: Moderate superior endplate compression fracture deformity at L2. Status post PLIF at L2-4. Degenerative changes of the lumbar spine. Review of the MIP images confirms the above findings. IMPRESSION: Isolated segmental/subsegmental right lower lobe pulmonary embolus. Overall clot burden is small. No evidence of right heart strain. No evidence of thoracoabdominal aortic aneurysm or dissection. Additional ancillary findings as above. Critical Value/emergent results were called by telephone at the time of interpretation on 07/11/2023 at 7:24 pm to provider Dr Criss Alvine, who verbally acknowledged these results. Electronically Signed   By: Charline Bills M.D.   On: 07/11/2023 19:26    MR LUMBAR SPINE WO CONTRAST   Result Date: 07/07/2023 CLINICAL DATA:  Low back pain, cauda equina syndrome suspected. Low back pain. History of lumbar surgery. EXAM: MRI LUMBAR SPINE WITHOUT CONTRAST TECHNIQUE: Multiplanar, multisequence MR imaging of the lumbar spine was performed. No intravenous contrast was administered. COMPARISON:  Lumbar spine MRI 03/13/2023 FINDINGS: Segmentation:  Standard. Alignment: Mild lumbar levoscoliosis. Unchanged grade 1 retrolisthesis of L2 on L3 and grade 1 anterolisthesis of L4 on L5. Vertebrae: Unchanged chronic L2 compression fracture. No acute fracture, suspicious marrow lesion, or significant marrow edema. Prior L2-L4 posterior fusion. Conus medullaris and cauda equina: Conus extends to the L2 level. Conus and cauda equina appear normal. Paraspinal and other soft tissues: Postoperative changes in the posterior lumbar soft tissues including an unchanged 4 cm collection in the laminectomy bed at L3-4, possibly a chronic seroma. Disc levels: Disc desiccation in up to mild disc space narrowing throughout the lumbar spine. T12-L1: Negative. L1-2:  Mild disc bulging and mild facet hypertrophy without stenosis, unchanged. L2-3: Prior posterior decompression and fusion. Right eccentric disc bulging, endplate spurring, and moderate facet hypertrophy result in mild right neural foraminal stenosis without spinal stenosis, unchanged. L3-4: Prior posterior decompression and fusion. Disc bulging and moderate facet hypertrophy result in minimal to mild bilateral neural foraminal stenosis without spinal stenosis, unchanged. L4-5: Anterolisthesis with bulging uncovered disc and severe facet and ligamentum flavum hypertrophy result in moderate spinal stenosis and mild bilateral neural foraminal stenosis, unchanged. L5-S1: Mild disc bulging and severe facet hypertrophy result in mild left neural foraminal stenosis without spinal stenosis, unchanged. IMPRESSION: Unchanged lumbar disc and facet degeneration, most notable at L4-5 where there is moderate spinal stenosis. Electronically Signed   By: Sebastian Ache M.D.   On: 07/07/2023 15:23    CT ABDOMEN PELVIS W CONTRAST   Result Date: 07/07/2023 CLINICAL DATA:  Abdominal pain, acute, nonlocalized. Urinary retention. EXAM: CT ABDOMEN AND PELVIS WITH CONTRAST TECHNIQUE: Multidetector CT imaging of the abdomen and pelvis was performed using the standard protocol following bolus administration of intravenous contrast. RADIATION DOSE REDUCTION: This exam was performed according to the departmental dose-optimization program which includes automated exposure control, adjustment of the mA and/or kV according to patient size and/or use of iterative reconstruction  technique. CONTRAST:  OMNIPAQUE IOHEXOL 300 MG/ML  SOLN COMPARISON:  None Available. FINDINGS: Lower chest: No acute abnormality. Hepatobiliary: No focal liver abnormality is seen. No gallstones, gallbladder wall thickening, or biliary dilatation. Pancreas: Unremarkable. No pancreatic ductal dilatation or surrounding inflammatory changes. Spleen: Normal in size  without focal abnormality. Adrenals/Urinary Tract: Adrenal glands are unremarkable. Kidneys are normal, without renal calculi, focal lesion, or hydronephrosis. Bladder is decompressed. Stomach/Bowel: Stomach is within normal limits. Appendix appears normal. Sigmoid diverticulosis. No evidence of bowel wall thickening, distention, or inflammatory changes. Vascular/Lymphatic: Aortic atherosclerosis. No enlarged abdominal or pelvic lymph nodes. Reproductive: Uterus and bilateral adnexa are unremarkable. Other: No abdominal wall hernia or abnormality. No abdominopelvic ascites. Musculoskeletal: Prior L2-L4 laminectomy and posterior spinal fusion. 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 IMPRESSION: 1. No acute abdominopelvic abnormality. 2. Sigmoid diverticulosis without evidence of acute diverticulitis. 3. Prior L2-L4 laminectomy and posterior spinal fusion. 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 Aortic Atherosclerosis (ICD10-I70.0). Electronically Signed   By: Orvan Falconer M.D.   On: 07/07/2023 12:30       Microbiology: No results found for this or any previous visit (from the past 240 hour(s)).    Labs: Basic Metabolic Panel: Last Labs        Recent Labs  Lab 07/07/23 0928 07/11/23 1228 07/11/23 1925 07/12/23 0631  NA 134* 134* 130* 135  K 3.2* 3.8 3.5 3.8  CL 100 101 100 100  CO2 23 22 22 27   GLUCOSE 119* 134* 126* 118*  BUN 15 11 14 12   CREATININE 0.87 0.95 0.84 0.87  CALCIUM 8.4* 9.4 9.0 8.9  MG 2.2  --   --   --       Liver Function Tests: Last Labs       Recent Labs  Lab 07/07/23 0928 07/11/23 1228 07/11/23 1925  AST 27 25 19   ALT 39 29 27  ALKPHOS 78 85 86  BILITOT 0.5 0.6 0.8  PROT 6.6 6.8 7.1  ALBUMIN 3.4* 3.7 3.9      Last Labs      Recent Labs  Lab 07/11/23 1228 07/11/23 1925  LIPASE 24 19      Last Labs  No results for input(s): "AMMONIA" in the last 168 hours.   CBC: Last Labs         Recent Labs  Lab  07/07/23 0928 07/11/23 1228 07/11/23 1925 07/12/23 0631 07/13/23 0557  WBC 2.9* 6.3 8.1 8.7 5.0  NEUTROABS 1.4*  --   --   --   --   HGB 13.3 14.4 14.3 12.9 12.1  HCT 39.8 43.3 42.8 39.5 38.1  MCV 89.4 90.0 88.4 90.4 93.8  PLT 222 284 262 240 210      Cardiac Enzymes: Last Labs  No results for input(s): "CKTOTAL", "CKMB", "CKMBINDEX", "TROPONINI" in the last 168 hours.   BNP: BNP (last 3 results) Recent Labs (within last 365 days)  No results for input(s): "BNP" in the last 8760 hours.     ProBNP (last 3 results) Recent Labs (within last 365 days)  No results for input(s): "PROBNP" in the last 8760 hours.     CBG: Last Labs  No results for input(s): "GLUCAP" in the last 168 hours.           Signed:   Zannie Cove MD.  [facility] 07/13/2023, 11:02 AM

## 2023-07-28 ENCOUNTER — Encounter: Payer: PPO | Admitting: Orthopaedic Surgery

## 2023-10-12 ENCOUNTER — Ambulatory Visit: Payer: PPO | Admitting: Cardiovascular Disease

## 2023-11-11 ENCOUNTER — Encounter: Payer: Self-pay | Admitting: Cardiovascular Disease

## 2023-11-11 ENCOUNTER — Ambulatory Visit: Payer: PPO | Attending: Cardiovascular Disease | Admitting: Cardiovascular Disease

## 2023-11-11 VITALS — BP 114/66 | HR 80 | Ht 67.0 in | Wt 207.0 lb

## 2023-11-11 DIAGNOSIS — Z01810 Encounter for preprocedural cardiovascular examination: Secondary | ICD-10-CM | POA: Diagnosis not present

## 2023-11-11 DIAGNOSIS — E7800 Pure hypercholesterolemia, unspecified: Secondary | ICD-10-CM

## 2023-11-11 DIAGNOSIS — I452 Bifascicular block: Secondary | ICD-10-CM

## 2023-11-11 DIAGNOSIS — I700 Atherosclerosis of aorta: Secondary | ICD-10-CM | POA: Diagnosis not present

## 2023-11-11 DIAGNOSIS — Z86711 Personal history of pulmonary embolism: Secondary | ICD-10-CM

## 2023-11-11 DIAGNOSIS — I10 Essential (primary) hypertension: Secondary | ICD-10-CM

## 2023-11-11 NOTE — Progress Notes (Signed)
Cardiology Office Note:    Date:  11/11/2023   ID:  [name], DOB 08/24/1946, MRN [id number]  PCP:  Mattie Marlin, DO   [facility] Providers Cardiologist:  None     Referring MD: Mattie Marlin, DO   Chief Complaint  Patient presents with   Consult  Mary Mullins is a 78 y.o. female who is being seen today for the evaluation of preop CV risk at the request of [name], [name], DO. and Dr. [name], MD   History of Present Illness:    Mary Mullins is a 78 y.o. female with a history of hypercholesterolemia, hypertension, pulmonary embolism (PE) following a COVID-19 infection, presents for consultation regarding the discontinuation of Xarelto and preop cardiovascular risk evaluation (plans to have bilateral hip replacement).   The PE was identified as a single, small clot in the lung, incidentally discovered during a lung scan performed to exclude aortic dissection when she presented with chest pain in the setting of COVID-19.  Also noted on the same scan was aortic atherosclerosis with a normal caliber aorta, but no dissection.  There is no noticeable calcification of the coronary arteries on my review.  She has been on Xarelto for four months. The patient expresses a desire to cease Xarelto therapy.  She has no other history of previous venous thromboembolic abnormalities.  The patient is also scheduled for hip replacement surgery, which has been delayed due to the PE and subsequent anticoagulation therapy. The patient reports significant hip pain and mobility issues, which are more limiting than any cardiovascular symptoms.  She denies any chest pain or shortness of breath with activity.  She can climb a flight of stairs without difficulty breathing, but has a lot of pain in her hips and back.  The patient has a history of hypertension, controlled with amlodipine, and hyperlipidemia, managed with rosuvastatin. The patient denies any history of  smoking and does not have diabetes. The patient's cholesterol levels were last checked in 2023, with an LDL of 111.  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 This has been present since at least 2022 on review of description of tracings in care everywhere. The patient is asymptomatic from this electrical abnormality.  He is the patient has a family history of heart disease, with all parents and grandparents having died of heart-related problems.  Past Medical History:  Diagnosis Date   Complication of anesthesia    Took a while to wake up after wrist surgery   Dyslipidemia    Hypertension    Neuromuscular disorder (HCC)    Right shoulder 80 percent use    Pulmonary nodule    8 mm pleural based LLL nodule, stabel 05/13/21 CT ([facility])    Past Surgical History:  Procedure Laterality Date   ANKLE FRACTURE SURGERY     L ankle   CATARACT EXTRACTION W/ INTRAOCULAR LENS IMPLANT Bilateral    ENTEROCELE REPAIR  08/21/2021   EYE SURGERY     LUMBAR LAMINECTOMY/DECOMPRESSION MICRODISCECTOMY N/A 10/13/2021   Procedure: OPEN LAMINECTOMY, LUMBAR TWO-THREE, LUMBAR  THREE-FOUR;  Surgeon: Dawley, Alan Mulder, DO;  Location: [facility] OR;  Service: Neurosurgery;  Laterality: N/A;   TONSILLECTOMY     TRIGGER FINGER RELEASE Right 02/17/2015   Procedure:  RIGHT LONG FINGER AND RING FINGER TRIGGER RELEASE;  Surgeon: Mack Hook, MD;  Location: [facility];  Service: Orthopedics;  Laterality: Right;  WRIST FRACTURE SURGERY     R wrist   WRIST SURGERY Right 10/25/2005    Current Medications: Current Meds  Medication Sig   amLODipine (NORVASC) 10 MG tablet Take 10 mg by mouth daily.   rosuvastatin (CRESTOR) 10 MG tablet Take 1 tablet by mouth daily.   [DISCONTINUED] XARELTO 20 MG TABS tablet Take 20 mg by mouth in the morning.     Allergies:   Patient has no known allergies.   Social History   Socioeconomic  History   Marital status: Married    Spouse name: Mary Mullins   Number of children: 2   Years of education: HS   Highest education level: Not on file  Occupational History    Employer: OTHER    Comment: self-employed  Tobacco Use   Smoking status: Never    Passive exposure: Never   Smokeless tobacco: Never  Vaping Use   Vaping status: Never Used  Substance and Sexual Activity   Alcohol use: Yes    Alcohol/week: 0.0 standard drinks of alcohol    Comment: 1 glass of wine daily   Drug use: No   Sexual activity: Not on file  Other Topics Concern   Not on file  Social History Narrative   Patient lives at home with spouse.   Caffeine use: 3 cups daily   Social Drivers of Health   Financial Resource Strain: Low Risk  (07/07/2022)   [facility] prior to 12/25/2022[facility] visits prior to 12/25/2022.   Overall Financial Resource Strain (CARDIA)    Difficulty of Paying Living Expenses: Not hard at all  Food Insecurity: Low Risk  (09/06/2023)   [facility]   Hunger Vital Sign    Worried About Running Out of Food in the Last Year: Never true    Ran Out of Food in the Last Year: Never true  Transportation Needs: No Transportation Needs (09/06/2023)   Received from [employer]    In the past 12 months, has lack of reliable transportation kept you from medical appointments, meetings, work or from getting things needed for daily living? : No  Physical Activity: Inactive (07/07/2022)   [facility] prior to 12/25/2022., [facility] visits prior to 12/25/2022.   Exercise Vital Sign    Days of Exercise per Week: 0 days    Minutes of Exercise per Session: 0 min  Stress: No Stress Concern Present (07/07/2022)   [facility] visits prior to  12/25/2022., [facility] visits prior to 12/25/2022.   [employer] of Occupational Health - Occupational Stress Questionnaire    Feeling of Stress : Not at all  Social Connections: Moderately Isolated (07/07/2022)   [facility] from [facility] visits prior to 12/25/2022., [facility] visits prior to 12/25/2022.   Social Advertising account executive [NHANES]    Frequency of Communication with Friends and Family: More than three times a week    Frequency of Social Gatherings with Friends and Family: Once a week    Attends Religious Services: Never    Database administrator or Organizations: No    Attends Banker Meetings: Never    Marital Status: Married     Family History: The  patient's family history includes Cirrhosis in her father; Heart Problems in her mother.  ROS:   Please see the history of present illness.     All other systems reviewed and are negative.  EKGs/Labs/Other Studies Reviewed:    The following studies were reviewed today:  EKG Interpretation Date/Time:  Friday November 11 2023 13:46:00 EST Ventricular Rate:  80 PR Interval:  172 QRS Duration:  136 QT Interval:  422 QTC Calculation: 486 R Axis:   -48  Text Interpretation: Normal sinus rhythm Right bundle branch block Left anterior fascicular block Bifascicular block Minimal voltage criteria for LVH, may be normal variant ( R in aVL ) When compared with ECG of 11-Jul-2023 19:50, PREVIOUS ECG IS PRESENT Confirmed by Greydis Stlouis [phone]) on 11/11/2023 1:52:27 PM    Recent Labs: 07/07/2023: Magnesium 2.2 07/11/2023: ALT 27 07/12/2023: BUN 12; Creatinine, Ser 0.87; Potassium 3.8; Sodium 135 07/13/2023: Hemoglobin 12.1; Platelets 210  Recent Lipid Panel No results found for: "CHOL", "TRIG", "HDL", "CHOLHDL", "VLDL", "LDLCALC", "LDLDIRECT"    Risk Assessment/Calculations:                 Physical Exam:    VS:  BP 114/66 (BP Location: Left Arm, Patient Position: Sitting, Cuff Size: Large)   Pulse 80   Ht 5\' 7"  (1.702 m)   Wt 207 lb (93.9 kg)   SpO2 96%   BMI 32.42 kg/m     Wt Readings from Last 3 Encounters:  11/11/23 207 lb (93.9 kg)  07/11/23 200 lb (90.7 kg)  07/07/23 207 lb 0.2 oz (93.9 kg)     GEN: Mildly obese, well nourished, well developed in no acute distress HEENT: Normal NECK: No JVD; No carotid bruits LYMPHATICS: No lymphadenopathy CARDIAC: RRR, no murmurs, rubs, gallops RESPIRATORY:  Clear to auscultation without rales, wheezing or rhonchi  ABDOMEN: Soft, non-tender, non-distended MUSCULOSKELETAL:  No edema; No deformity  SKIN: Warm and dry NEUROLOGIC:  Alert and oriented x 3 PSYCHIATRIC:  Normal affect   ASSESSMENT:    1. History of pulmonary embolism   2. Bifascicular block   3. Atherosclerosis of aorta (HCC)   4. Pure hypercholesterolemia   5. Primary hypertension   6. Preoperative cardiovascular examination    PLAN:    In order of problems listed above:  Hx of PE: This was discovered incidentally, was essentially asymptomatic and occurred in the setting of COVID-19 infection (provoked thromboembolic event).  I think 3 months of oral anticoagulant is sufficient and she can stop the Xarelto now.  Discussed the fact that it is important to prevent DVT/PE with hip surgery but I think her risk of these complications is average and will defer the exact operative prophylaxis to her orthopedic surgeon (SCDs, low dose ASA or DOAC). Bifascicular Block:  right bundle branch block and left anterior fascicular block, present since at least December 2023. Asymptomatic, and the condition does not currently impact heart function. However, there is a potential risk of developing complete heart block in the future, which would necessitate a pacemaker. Discussed the signs of complete heart block, including dizziness and fatigue, and the  potential need for a pacemaker. Aortic Atherosclerosis:  calcified plaques noted on imaging. Discussed the importance of maintaining LDL below 100 mg/dL to prevent progression. Hyperlipidemia: on rosuvastatin. LDL was 169 mg/dL before treatment and 259 mg/dL in the most recent labs from 2023. Given their family history of coronary problems, it is recommended to keep LDL below 100 mg/dL. Discussed the difficulty of achieving this target  without medication and the importance of continuing rosuvastatin. Hypertension: Well-controlled hypertension managed with amlodipine. Preop CV eval: low overall CV risk.  Standard DVT prophylaxis.  Follow-up - Follow up with primary care for cholesterol test - Contact cardiologist if experiencing symptoms like dizziness or irregular heartbeats.          Medication Adjustments/Labs and Tests Ordered: Current medicines are reviewed at length with the patient today.  Concerns regarding medicines are outlined above.  Orders Placed This Encounter  Procedures   Lipid panel   EKG 12-Lead   No orders of the defined types were placed in this encounter.   Patient Instructions  Medication Instructions:  STOP XARELTO *If you need a refill on your cardiac medications before your next appointment, please call your pharmacy*   Lab Work: Lipid panel- Fasting lab work- can get drawn at PCP If you have labs (blood work) drawn today and your tests are completely normal, you will receive your results only by: MyChart Message (if you have MyChart) OR A paper copy in the mail If you have any lab test that is abnormal or we need to change your treatment, we will call you to review the results.   Follow-Up: At [facility], you and your health needs are our priority.  As part of our continuing mission to provide you with exceptional heart care, we have created designated Provider Care Teams.  These Care Teams include your primary Cardiologist (physician) and  Advanced Practice Providers (APPs -  Physician Assistants and Nurse Practitioners) who all work together to provide you with the care you need, when you need it.  We recommend signing up for the patient portal called "MyChart".  Sign up information is provided on this After Visit Summary.  MyChart is used to connect with patients for Virtual Visits (Telemedicine).  Patients are able to view lab/test results, encounter notes, upcoming appointments, etc.  Non-urgent messages can be sent to your provider as well.   To learn more about what you can do with MyChart, go to [email or web address].    Your next appointment:    Follow up as needed  Provider:   Dr Royann Shivers          Signed, Thurmon Fair, MD  11/11/2023 5:51 PM    [facility]

## 2023-11-11 NOTE — Patient Instructions (Addendum)
Medication Instructions:  STOP XARELTO *If you need a refill on your cardiac medications before your next appointment, please call your pharmacy*   Lab Work: Lipid panel- Fasting lab work- can get drawn at PCP If you have labs (blood work) drawn today and your tests are completely normal, you will receive your results only by: MyChart Message (if you have MyChart) OR A paper copy in the mail If you have any lab test that is abnormal or we need to change your treatment, we will call you to review the results.   Follow-Up: At [facility], you and your health needs are our priority.  As part of our continuing mission to provide you with exceptional heart care, we have created designated Provider Care Teams.  These Care Teams include your primary Cardiologist (physician) and Advanced Practice Providers (APPs -  Physician Assistants and Nurse Practitioners) who all work together to provide you with the care you need, when you need it.  We recommend signing up for the patient portal called "MyChart".  Sign up information is provided on this After Visit Summary.  MyChart is used to connect with patients for Virtual Visits (Telemedicine).  Patients are able to view lab/test results, encounter notes, upcoming appointments, etc.  Non-urgent messages can be sent to your provider as well.   To learn more about what you can do with MyChart, go to [email or web address].    Your next appointment:    Follow up as needed  Provider:   Dr Royann Shivers

## 2023-11-23 ENCOUNTER — Ambulatory Visit: Payer: PPO | Admitting: Orthopaedic Surgery

## 2023-11-23 VITALS — Ht 67.0 in | Wt 207.0 lb

## 2023-11-23 DIAGNOSIS — M25552 Pain in left hip: Secondary | ICD-10-CM

## 2023-11-23 DIAGNOSIS — M25551 Pain in right hip: Secondary | ICD-10-CM | POA: Diagnosis not present

## 2023-11-23 MED ORDER — LIDOCAINE HCL 1 % IJ SOLN
3.0000 mL | INTRAMUSCULAR | Status: AC | PRN
Start: 2023-11-23 — End: 2023-11-23
  Administered 2023-11-23: 3 mL

## 2023-11-23 MED ORDER — METHYLPREDNISOLONE ACETATE 40 MG/ML IJ SUSP
40.0000 mg | INTRAMUSCULAR | Status: AC | PRN
Start: 2023-11-23 — End: 2023-11-23
  Administered 2023-11-23: 40 mg via INTRA_ARTICULAR

## 2023-11-23 NOTE — Progress Notes (Signed)
The patient has still not seen before.  She is 78 years old and we originally saw her for hip pain for left hip.  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  She has had spine surgery as well.  Back in the fall she was admitted to the hospital with severe COVID.  She ended up having a small PE and was on Xarelto for 4 months.  She is now off of blood thinners.  She comes in with no groin pain but has bilateral hip pain as well as back pain and pain in the posterior aspect of her pelvis.  She was hoping to discuss hip replacement surgery but I really want to get a good sense of where her pain is truly coming from.  When she walks she actually walks not standing straight up and leans slightly forward.  She points to the posterior aspect of her pelvis her low back is the main source of her pain but also on the lateral aspect of both hips and it radiates down into both feet.  She does see her spine specialist but has been a while.  Again she denies any groin pain.  I can easily put her left hip and right hip through internal extra rotation with no pain in the groin and no blocks to rotation.  She has significant pain over the right hip trochanteric area but also the posterior pelvis and the IT band and this is the same on both sides.  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  There is joint space narrowing and osteophytes.  I did recommend a steroid injection of her right hip trochanteric area today which was the point of maximum tenderness because I want to see how she responds to this injection.  I would also like her to have an appointment with my partner Dr. Shon Baton in the next 1 to 2 weeks so he can evaluate her in terms of considering an intra-articular steroid injection in her right hip and potentially even a lateral trochanteric injection over the left hip.  I am still trying to get a sense of whether or not a hip  replacement to truly help her.  Her physical therapist believes that this is all driven by her hips but I am just not certain and convinced that that is the case.  She did tolerate the steroid injection well over the right hip trochanteric area.  When she sees Dr. Shon Baton I would like him to get her back to me a few weeks later.  She agrees with the treatment plan.    Procedure Note  Patient: Mary Mullins             Date of Birth: 1946-09-24           MRN: [id number]             Visit Date: 11/23/2023  Procedures: Visit Diagnoses:  1. Pain of left hip   2. Pain of right hip     Large Joint Inj: R greater trochanter on 11/23/2023 10:55 AM Indications: pain and diagnostic evaluation Details: 22 G 1.5 in needle, lateral approach  Arthrogram: No  Medications: 3 mL lidocaine 1 %; 40 mg methylPREDNISolone acetate 40 MG/ML Outcome: tolerated well, no immediate complications Procedure, treatment alternatives, risks and benefits explained, specific risks discussed. Consent was given by the patient. Immediately prior to procedure a time out was  called to verify the correct patient, procedure, equipment, support staff and site/side marked as required. Patient was prepped and draped in the usual sterile fashion.

## 2023-11-29 ENCOUNTER — Other Ambulatory Visit: Payer: Self-pay

## 2023-11-29 ENCOUNTER — Ambulatory Visit (INDEPENDENT_AMBULATORY_CARE_PROVIDER_SITE_OTHER): Payer: PPO | Admitting: Sports Medicine

## 2023-11-29 DIAGNOSIS — M160 Bilateral primary osteoarthritis of hip: Secondary | ICD-10-CM | POA: Diagnosis not present

## 2023-11-29 DIAGNOSIS — M1611 Unilateral primary osteoarthritis, right hip: Secondary | ICD-10-CM | POA: Diagnosis not present

## 2023-11-29 DIAGNOSIS — G8929 Other chronic pain: Secondary | ICD-10-CM

## 2023-11-29 DIAGNOSIS — M25551 Pain in right hip: Secondary | ICD-10-CM | POA: Diagnosis not present

## 2023-11-29 DIAGNOSIS — M25552 Pain in left hip: Secondary | ICD-10-CM | POA: Diagnosis not present

## 2023-11-29 DIAGNOSIS — M5441 Lumbago with sciatica, right side: Secondary | ICD-10-CM

## 2023-11-29 DIAGNOSIS — Z981 Arthrodesis status: Secondary | ICD-10-CM

## 2023-11-29 DIAGNOSIS — M48062 Spinal stenosis, lumbar region with neurogenic claudication: Secondary | ICD-10-CM | POA: Diagnosis not present

## 2023-11-29 DIAGNOSIS — M5442 Lumbago with sciatica, left side: Secondary | ICD-10-CM

## 2023-11-29 NOTE — Progress Notes (Signed)
 Mary Mullins - 78 y.o. female MRN [id number]  Date of birth: 1946-10-06  Office Visit Note: Visit Date: 11/29/2023 PCP: [name], [name], DO Referred by: [name], [name], DO  Subjective: Chief Complaint  Patient presents with   Lower Back - Pain   HPI: Mary Mullins is a pleasant 78 y.o. female who presents today for chronic low back pain and lateral hip/leg pain.  She presents today to discuss possible pain coming from her hips.  She is status post lumbar fusion from L2-L4 with prior laminectomy. Her spine physician who performed her surgeries is Dr. Carollee with [facility].  Per her report, she does have hip arthritis and they recommended she seek evaluation to see if her pain was coming from her hips.  She did see Dr. Vernetta on 11/23/2023 and did have a right greater trochanteric injection which really did not give her any relief.  Kynslie tells me today that most of her pain is localized over both the right and left side of the low back that will radiate down the side of her legs into the front of her legs to her knees.  At times this radiating pain will go into the toes.  She has been in formalized physical therapy for about 1 year and has made some progress although she still walks bent forward at the hips and has difficulty standing up straight.  Pertinent ROS were reviewed with the patient and found to be negative unless otherwise specified above in HPI.   Assessment & Plan: Visit Diagnoses:  1. Spinal stenosis of lumbar region with neurogenic claudication   2. Bilateral hip pain   3. Bilateral primary osteoarthritis of hip   4. History of lumbar fusion   5. Chronic bilateral low back pain with bilateral sciatica    Plan: Impression is chronic low back pain with spinal stenosis, prior lumbar fusion from L2-L4 with laminectomies who is having pain that wraps from the back to the lateral hips and into the thighs.  I did review her hip x-rays as well which show at  least moderate arthritis about the hips, but her exam does not really suggest that her pain is emanating from the hips, it seems very much so coming from the back.  We discussed however wanting to rule this out completely, so through shared decision making we did proceed with an ultrasound-guided right intra-articular hip injection.  She will pay attention over the next 1-2 week to see if this improves her pain at all and to what extent.  If she does not have much improvement, as she did not have much improvement with a greater trochanteric injection, we can with much more certainty rule out the pain coming from the hips and likely emanating from her spine.  She will follow-up with Dr. Vernetta back in about 2 weeks per his recommendation for feedback moving forward.  Likely will ultimately get her back to her spine physician.  Follow-up: Return in about 2 weeks (around 12/13/2023) for with Dr. Lorane Gaskins (hips/back).   Meds & Orders: No orders of the defined types were placed in this encounter.   Orders Placed This Encounter  Procedures   Large Joint Inj   US  Guided Needle Placement - No Linked Charges     Procedures: Large Joint Inj: R hip joint on 11/29/2023 9:35 AM Indications: pain Details: 22 G 3.5 in needle, ultrasound-guided anterior approach Medications: 4 mL lidocaine  1 %; 40 mg methylPREDNISolone  acetate 40 MG/ML Outcome:  tolerated well, no immediate complications  Procedure: US -guided intra-articular hip injection, Right After discussion on risks/benefits/indications and informed verbal consent was obtained, a timeout was performed. Patient was lying supine on exam table. The hip was cleaned with betadine and alcohol swabs. Then utilizing ultrasound guidance, the patient's femoral head and neck junction was identified and subsequently injected with 4:1 lidocaine :depomedrol via an in-plane approach with ultrasound visualization of the injectate administered into the hip joint.  Patient tolerated procedure well without immediate complications.  Procedure, treatment alternatives, risks and benefits explained, specific risks discussed. Consent was given by the patient. Immediately prior to procedure a time out was called to verify the correct patient, procedure, equipment, support staff and site/side marked as required. Patient was prepped and draped in the usual sterile fashion.          Clinical History: No specialty comments available.  She reports that she has never smoked. She has never been exposed to tobacco smoke. She has never used smokeless tobacco. No results for input(s): HGBA1C, LABURIC in the last 8760 hours.  Objective:    Physical Exam  Gen: Well-appearing, in no acute distress; non-toxic CV: Well-perfused. Warm.  Resp: Breathing unlabored on room air; no wheezing. Psych: Fluid speech in conversation; appropriate affect; normal thought process  Ortho Exam - Lumbar: Patient walks with a forward flexion about the hips leaning forward.  She has pain both near the midline and of bilateral paraspinal musculature from the L2-L5 level.  - Bilateral hips: There is some TTP around the greater trochanteric region bilaterally although she has relatively well-preserved hip abduction.  Her hips move rather fluidly with internal and external logroll.  There is mild pain with both FADIR and FABER testing but this localizes to the posterior aspect of the hip/buttock as she has no pain within the groin bilaterally.  Negative Stinchfield test bilaterally.  Imaging:  Narrative & Impression  CLINICAL DATA:  Low back pain, cauda equina syndrome suspected. Low back pain. History of lumbar surgery.   EXAM: MRI LUMBAR SPINE WITHOUT CONTRAST   TECHNIQUE: Multiplanar, multisequence MR imaging of the lumbar spine was performed. No intravenous contrast was administered.   COMPARISON:  Lumbar spine MRI 03/13/2023   FINDINGS: Segmentation:  Standard.    Alignment: Mild lumbar levoscoliosis. Unchanged grade 1 retrolisthesis of L2 on L3 and grade 1 anterolisthesis of L4 on L5.   Vertebrae: Unchanged chronic L2 compression fracture. No acute fracture, suspicious marrow lesion, or significant marrow edema. Prior L2-L4 posterior fusion.   Conus medullaris and cauda equina: Conus extends to the L2 level. Conus and cauda equina appear normal.   Paraspinal and other soft tissues: Postoperative changes in the posterior lumbar soft tissues including an unchanged 4 cm collection in the laminectomy bed at L3-4, possibly a chronic seroma.   Disc levels:   Disc desiccation in up to mild disc space narrowing throughout the lumbar spine.   T12-L1: Negative.   L1-2: Mild disc bulging and mild facet hypertrophy without stenosis, unchanged.   L2-3: Prior posterior decompression and fusion. Right eccentric disc bulging, endplate spurring, and moderate facet hypertrophy result in mild right neural foraminal stenosis without spinal stenosis, unchanged.   L3-4: Prior posterior decompression and fusion. Disc bulging and moderate facet hypertrophy result in minimal to mild bilateral neural foraminal stenosis without spinal stenosis, unchanged.   L4-5: Anterolisthesis with bulging uncovered disc and severe facet and ligamentum flavum hypertrophy result in moderate spinal stenosis and mild bilateral neural foraminal stenosis, unchanged.   L5-S1:  Mild disc bulging and severe facet hypertrophy result in mild left neural foraminal stenosis without spinal stenosis, unchanged.   IMPRESSION: Unchanged lumbar disc and facet degeneration, most notable at L4-5 where there is moderate spinal stenosis.     Electronically Signed   By: Dasie Hamburg M.D.   On: 07/07/2023 15:23    Narrative & Impression  CLINICAL DATA:  Abdominal pain, acute, nonlocalized. Urinary retention.   EXAM: CT ABDOMEN AND PELVIS WITH CONTRAST   TECHNIQUE: Multidetector CT  imaging of the abdomen and pelvis was performed using the standard protocol following bolus administration of intravenous contrast.   RADIATION DOSE REDUCTION: This exam was performed according to the departmental dose-optimization program which includes automated exposure control, adjustment of the mA and/or kV according to patient size and/or use of iterative reconstruction technique.   CONTRAST:  OMNIPAQUE  IOHEXOL  300 MG/ML  SOLN   COMPARISON:  None Available.   FINDINGS: Lower chest: No acute abnormality.   Hepatobiliary: No focal liver abnormality is seen. No gallstones, gallbladder wall thickening, or biliary dilatation.   Pancreas: Unremarkable. No pancreatic ductal dilatation or surrounding inflammatory changes.   Spleen: Normal in size without focal abnormality.   Adrenals/Urinary Tract: Adrenal glands are unremarkable. Kidneys are normal, without renal calculi, focal lesion, or hydronephrosis. Bladder is decompressed.   Stomach/Bowel: Stomach is within normal limits. Appendix appears normal. Sigmoid diverticulosis. No evidence of bowel wall thickening, distention, or inflammatory changes.   Vascular/Lymphatic: Aortic atherosclerosis. No enlarged abdominal or pelvic lymph nodes.   Reproductive: Uterus and bilateral adnexa are unremarkable.   Other: No abdominal wall hernia or abnormality. No abdominopelvic ascites.   Musculoskeletal: Prior L2-L4 laminectomy and posterior spinal fusion. 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   IMPRESSION: 1. No acute abdominopelvic abnormality. 2. Sigmoid diverticulosis without evidence of acute diverticulitis. 3. Prior L2-L4 laminectomy and posterior spinal fusion. 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   Aortic Atherosclerosis (ICD10-I70.0).     Electronically Signed   By: Ryan Chess M.D.   On: 07/07/2023 12:30   *Independent review and interpretation of hip  x-ray from 05/17/2023 was performed by myself today which demonstrates moderate to severe osteoarthritic change of bilateral hips with joint space narrowing and periarticular osteophytes.  The left has a large overhanging rim osteophyte.  There is some calcification of the superior aspect of bilateral greater trochanter as well.  Past Medical/Family/Surgical/Social History: Medications & Allergies reviewed per EMR, new medications updated. Patient Active Problem List   Diagnosis Date Noted   Pulmonary embolism (HCC) 07/11/2023   Hyponatremia 07/11/2023   COVID-19 virus infection 07/11/2023   Hypertension    Unilateral primary osteoarthritis, left hip 05/17/2023   Lumbar spinal stenosis 10/19/2022   Lumbar stenosis with neurogenic claudication 10/13/2021   Past Medical History:  Diagnosis Date   Complication of anesthesia    Took a while to wake up after wrist surgery   Dyslipidemia    Hypertension    Neuromuscular disorder (HCC)    Right shoulder 80 percent use    Pulmonary nodule    8 mm pleural based LLL nodule, stabel 05/13/21 CT ([facility])   Family History  Problem Relation Age of Onset   Heart Problems Mother    Cirrhosis Father    Past Surgical History:  Procedure Laterality Date   ANKLE FRACTURE SURGERY     L ankle   CATARACT EXTRACTION W/ INTRAOCULAR LENS IMPLANT Bilateral    ENTEROCELE REPAIR  08/21/2021   EYE SURGERY     LUMBAR LAMINECTOMY/DECOMPRESSION MICRODISCECTOMY N/A 10/13/2021   Procedure: OPEN LAMINECTOMY, LUMBAR TWO-THREE, LUMBAR  THREE-FOUR;  Surgeon: Dawley, Lani BROCKS, DO;  Location: [facility];  Service: Neurosurgery;  Laterality: N/A;   TONSILLECTOMY     TRIGGER FINGER RELEASE Right 02/17/2015   Procedure:  RIGHT LONG FINGER AND RING FINGER TRIGGER RELEASE;  Surgeon: Alm Hummer, MD;  Location: [facility];  Service: Orthopedics;  Laterality: Right;   WRIST FRACTURE SURGERY     R wrist   WRIST SURGERY Right 10/25/2005   Social History    Occupational History    Employer: OTHER    Comment: self-employed  Tobacco Use   Smoking status: Never    Passive exposure: Never   Smokeless tobacco: Never  Vaping Use   Vaping status: Never Used  Substance and Sexual Activity   Alcohol use: Yes    Alcohol/week: 0.0 standard drinks of alcohol    Comment: 1 glass of wine daily   Drug use: No   Sexual activity: Not on file

## 2023-11-29 NOTE — Progress Notes (Signed)
 Patient says that she has had lower back pain that goes down the side and front of her legs, sometimes down to the toes. She does not have numbness or tingling in the legs. She says that she has been in physical therapy for about a year and is able to stand up straighter, although she cannot stand up completely straight. She says that she has constant pain that does get sharp the longer she is on her feet. She says that sitting is okay but she has pain when laying down and when she changes position while laying down. She got an injection at her appointment last week and said that it helped with her pain when laying down but otherwise did not seem to make a difference in her symptoms.

## 2023-11-30 MED ORDER — METHYLPREDNISOLONE ACETATE 40 MG/ML IJ SUSP
40.0000 mg | INTRAMUSCULAR | Status: AC | PRN
Start: 2023-11-29 — End: 2023-11-29
  Administered 2023-11-29: 40 mg via INTRA_ARTICULAR

## 2023-11-30 MED ORDER — LIDOCAINE HCL 1 % IJ SOLN
4.0000 mL | INTRAMUSCULAR | Status: AC | PRN
Start: 2023-11-29 — End: 2023-11-29
  Administered 2023-11-29: 4 mL

## 2023-12-01 ENCOUNTER — Other Ambulatory Visit: Payer: Self-pay

## 2023-12-13 ENCOUNTER — Telehealth: Payer: Self-pay | Admitting: Radiology

## 2023-12-13 ENCOUNTER — Encounter: Payer: Self-pay | Admitting: Radiology

## 2023-12-13 ENCOUNTER — Telehealth: Payer: Self-pay

## 2023-12-13 NOTE — Telephone Encounter (Signed)
error 

## 2023-12-13 NOTE — Telephone Encounter (Signed)
 Patient called regarding scheduled appointment for tomorrow afternoon.  She received message about [place] closing early.  She is not sure she needs to keep appointment.  She stated injections have not helped.  She continues to have same pain.  She will see Dr. Jake Samples at [facility] on 01/02/24 for follow up.   Wants to know Dr. Eliberto Ivory opinion.

## 2023-12-14 ENCOUNTER — Ambulatory Visit: Payer: PPO | Admitting: Orthopaedic Surgery

## 2023-12-14 NOTE — Telephone Encounter (Signed)
Pt aware of the below message 

## 2023-12-16 ENCOUNTER — Ambulatory Visit (HOSPITAL_COMMUNITY): Admit: 2023-12-16 | Payer: PPO | Admitting: Orthopaedic Surgery

## 2023-12-16 DIAGNOSIS — M1612 Unilateral primary osteoarthritis, left hip: Secondary | ICD-10-CM

## 2023-12-16 SURGERY — TOTAL HIP ARTHROPLASTY ANTERIOR APPROACH
Anesthesia: Choice | Laterality: Left

## 2023-12-29 ENCOUNTER — Encounter: Payer: PPO | Admitting: Orthopaedic Surgery

## 2024-05-01 ENCOUNTER — Other Ambulatory Visit: Payer: Self-pay | Admitting: Student

## 2024-05-01 DIAGNOSIS — M5416 Radiculopathy, lumbar region: Secondary | ICD-10-CM

## 2024-05-02 NOTE — Discharge Instructions (Signed)

## 2024-05-03 ENCOUNTER — Ambulatory Visit
Admission: RE | Admit: 2024-05-03 | Discharge: 2024-05-03 | Disposition: A | Discharge status: Home / Self Care | Source: Ambulatory Visit | Attending: Student | Admitting: Student

## 2024-05-03 DIAGNOSIS — M5416 Radiculopathy, lumbar region: Secondary | ICD-10-CM

## 2024-05-03 MED ORDER — ONDANSETRON HCL 4 MG/2ML IJ SOLN: 4.0000 mg | Freq: Once | INTRAMUSCULAR | Status: DC | PRN

## 2024-05-03 MED ORDER — IOPAMIDOL (ISOVUE-M 200) INJECTION 41%
18.0000 mL | Freq: Once | INTRAMUSCULAR | Status: AC
  Administered 2024-05-03: 18 mL via INTRATHECAL

## 2024-05-03 MED ORDER — DIAZEPAM 5 MG PO TABS: 5.0000 mg | ORAL_TABLET | Freq: Once | ORAL | Status: DC

## 2024-05-03 MED ORDER — MEPERIDINE HCL 50 MG/ML IJ SOLN: 50.0000 mg | Freq: Once | INTRAMUSCULAR | Status: DC | PRN

## 2024-07-20 ENCOUNTER — Encounter: Payer: Self-pay | Admitting: Neurosurgery

## 2024-07-20 DIAGNOSIS — M47816 Spondylosis without myelopathy or radiculopathy, lumbar region: Secondary | ICD-10-CM | POA: Insufficient documentation

## 2024-07-20 DIAGNOSIS — M5416 Radiculopathy, lumbar region: Secondary | ICD-10-CM | POA: Insufficient documentation

## 2024-07-20 DIAGNOSIS — M47894 Other spondylosis, thoracic region: Secondary | ICD-10-CM | POA: Insufficient documentation

## 2024-07-20 DIAGNOSIS — K5904 Chronic idiopathic constipation: Secondary | ICD-10-CM | POA: Insufficient documentation

## 2024-07-20 DIAGNOSIS — S32020A Wedge compression fracture of second lumbar vertebra, initial encounter for closed fracture: Secondary | ICD-10-CM | POA: Insufficient documentation

## 2024-07-20 DIAGNOSIS — M25559 Pain in unspecified hip: Secondary | ICD-10-CM | POA: Insufficient documentation

## 2024-07-20 DIAGNOSIS — M546 Pain in thoracic spine: Secondary | ICD-10-CM | POA: Insufficient documentation

## 2024-07-20 DIAGNOSIS — M51369 Other intervertebral disc degeneration, lumbar region without mention of lumbar back pain or lower extremity pain: Secondary | ICD-10-CM | POA: Insufficient documentation

## 2024-07-20 DIAGNOSIS — S32009K Unspecified fracture of unspecified lumbar vertebra, subsequent encounter for fracture with nonunion: Secondary | ICD-10-CM | POA: Insufficient documentation

## 2024-07-20 NOTE — Progress Notes (Addendum)
 Referring Physician:  Macarthur Elouise SQUIBB, DO [id number] [place],  [place] [address]  Primary Physician:  [name], [name], DO  History of Present Illness: 07/24/2024 Mary Mullins is here today with a chief complaint of worsening back and leg pain.  She experiences severe lower back pain radiating down her legs, with a sensation of her muscles turning into 'concrete'. The pain worsens when standing from a sitting position, requiring small steps before walking normally. She can walk about ten feet before needing support. Without a shopping cart, she is unable to walk for more than a few minutes.  Physical therapy twice a week provides temporary relief, but pain returns after sitting in her car. Multiple injections have been ineffective.   Back pain is more prominent than leg pain, starting in the lower back and traveling down her legs, sometimes reaching her ankles. Significant pain is also present in the buttocks. She has a history of a compression fracture and underwent a myelogram two months ago. An MRI was performed a year ago.    Bowel/Bladder Dysfunction: none  Conservative measures: HEP Physical therapy: Is participating with [facility]-started 01/2024-still going. Multimodal medical therapy including regular antiinflammatories: Robaxin , Hydrocodone , Gabapentin, Oxycodone . Injections: 03/26/2024-ESI Right L4-L5 02/13/2024--Caudal Epidural injection 01/17/2024--MBB Bilateral T12-L1 L1-L2 03/28/2023--MBBB-Bilateral L4-L5 L5-S1 08/19/2022--TFESI Bilateral L2-L3  Past Surgery: 10/13/2021---LUMBAR LAMINECTOMY/DECOMPRESSION MICRODISCECTOMY -L2-L4 by Dr Carollee 10/19/2022- posterior lumbar fusion L2-L4-revision decompression by Dr Dawley  I have utilized the care everywhere function in epic to review the outside records available from external health systems.   Review of Systems:  A 10 point review of systems is negative, except for the pertinent positives and negatives  detailed in the HPI.  Past Medical History: Past Medical History:  Diagnosis Date   Complication of anesthesia    Took a while to wake up after wrist surgery   Dyslipidemia    Hypertension    Neuromuscular disorder (HCC)    Right shoulder 80 percent use    Pulmonary nodule    8 mm pleural based LLL nodule, stabel 05/13/21 CT ([facility])    Past Surgical History: Past Surgical History:  Procedure Laterality Date   ANKLE FRACTURE SURGERY     L ankle   BACK SURGERY  10/19/2022   Revision decompression, posterior lateral instrumentation adn fusion- L2-3, L3-4 Dr Dawley   CATARACT EXTRACTION W/ INTRAOCULAR LENS IMPLANT Bilateral    ENTEROCELE REPAIR  08/21/2021   EYE SURGERY     LUMBAR LAMINECTOMY/DECOMPRESSION MICRODISCECTOMY N/A 10/13/2021   Procedure: OPEN LAMINECTOMY, LUMBAR TWO-THREE, LUMBAR  THREE-FOUR;  Surgeon: Dawley, Lani BROCKS, DO;  Location: [facility];  Service: Neurosurgery;  Laterality: N/A;   TONSILLECTOMY     TRIGGER FINGER RELEASE Right 02/17/2015   Procedure:  RIGHT LONG FINGER AND RING FINGER TRIGGER RELEASE;  Surgeon: Alm Hummer, MD;  Location: [facility];  Service: Orthopedics;  Laterality: Right;   WRIST FRACTURE SURGERY     R wrist   WRIST SURGERY Right 10/25/2005    Allergies: Allergies as of 07/24/2024   (No Known Allergies)    Medications:  Current Outpatient Medications:    amLODipine  (NORVASC ) 10 MG tablet, Take 10 mg by mouth daily., Disp: , Rfl:    albuterol (VENTOLIN HFA) 108 (90 Base) MCG/ACT inhaler, Inhale 2 puffs into the lungs every 6 (six) hours as needed for wheezing or shortness of breath. (Patient not taking: Reported on 11/11/2023), Disp: , Rfl:    HYDROcodone  bit-homatropine (HYCODAN) 5-1.5 MG/5ML  syrup, Take 5 mLs by mouth every 6 (six) hours as needed. (Patient not taking: Reported on 11/11/2023), Disp: , Rfl:    methocarbamol  (ROBAXIN ) 500 MG tablet, Take 500 mg by mouth as needed for muscle spasms. (Patient not taking:  Reported on 11/11/2023), Disp: , Rfl:    RIVAROXABAN  (XARELTO ) VTE STARTER PACK (15 & 20 MG), Follow package directions: Take one 15mg  tablet by mouth twice a day. On day 22, switch to one 20mg  tablet once a day. Take with food. (Patient not taking: Reported on 11/11/2023), Disp: 51 each, Rfl: 0   rosuvastatin  (CRESTOR ) 10 MG tablet, Take 1 tablet by mouth daily., Disp: , Rfl:   Social History: Social History   Tobacco Use   Smoking status: Never    Passive exposure: Never   Smokeless tobacco: Never  Vaping Use   Vaping status: Never Used  Substance Use Topics   Alcohol use: Yes    Alcohol/week: 0.0 standard drinks of alcohol    Comment: 1 glass of wine daily   Drug use: No    Family Medical History: Family History  Problem Relation Age of Onset   Heart Problems Mother    Cirrhosis Father     Physical Examination: There were no vitals filed for this visit.  General: Patient is in no apparent distress. Attention to examination is appropriate.  Neck:   Supple.  Full range of motion.  Respiratory: Patient is breathing without any difficulty.   NEUROLOGICAL:     Awake, alert, oriented to person, place, and time.  Speech is clear and fluent.   Cranial Nerves: Pupils equal round and reactive to light.  Facial tone is symmetric.  Facial sensation is symmetric. Shoulder shrug is symmetric. Tongue protrusion is midline.  There is no pronator drift.  Strength: Side Biceps Triceps Deltoid Interossei Grip Wrist Ext. Wrist Flex.  R 5 5 5 5 5 5 5   L 5 5 5 5 5 5 5    Side Iliopsoas Quads Hamstring PF DF EHL  R 5 5 5 5 5 5   L 5 5 5 5 5 5    Reflexes are 1+ and symmetric at the biceps, triceps, brachioradialis, patella and achilles.   Hoffman's is absent.   Bilateral upper and lower extremity sensation is intact to light touch.    No evidence of dysmetria noted.  Gait is antalgic.     Medical Decision Making  Imaging: CT Myelogram 05/04/2024 IMPRESSION: 1. Prior L2-L4  posterior fusion with loosening of the L2 and L4 screws and no definite solid arthrodesis. 2. Unchanged moderate spinal stenosis at L4-5. 3. Unchanged mild-to-moderate right neural foraminal stenosis at L2-3. 4.  Aortic Atherosclerosis (ICD10-I70.0).     Electronically Signed   By: Dasie Hamburg M.D.   On: 05/04/2024 13:05  I have personally reviewed the images and agree with the above interpretation.  Assessment and Plan: Mary Mullins is a pleasant 78 y.o. 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  She has no convincing fusion mass.  She has tried and failed conservative management.  At this point, no further conservative management is indicated.  Have recommended surgical intervention.  I have recommended L2-5 posterior spinal fusion with L4-5 decompression.  Given her failed fusion previously, I would recommend using bone morphogenetic protein.  I reviewed this with her including the risks inherent.  I discussed the planned procedure at  length with the patient, including the risks, benefits, alternatives, and indications. The risks discussed include but are not limited to bleeding, infection, need for reoperation, spinal fluid leak, stroke, vision loss, anesthetic complication, coma, paralysis, and even death. I also described in detail that improvement was not guaranteed.  The patient expressed understanding of these risks, and asked that we proceed with surgery. I described the surgery in layman's terms, and gave ample opportunity for questions, which were answered to the best of my ability.   I spent a total of 30 minutes in this patient's care today. This time was spent reviewing pertinent records including imaging studies, obtaining and confirming history, performing a directed evaluation, formulating and discussing my recommendations, and documenting  the visit within the medical record.   If this does not help, spinal cord stimulation may be an option.   Thank you for involving me in the care of this patient.      Malon Branton K. Clois MD, [facility]

## 2024-07-24 ENCOUNTER — Ambulatory Visit: Admitting: Neurosurgery

## 2024-07-24 ENCOUNTER — Encounter: Payer: Self-pay | Admitting: Neurosurgery

## 2024-07-24 VITALS — BP 130/84 | Ht 67.0 in | Wt 199.4 lb

## 2024-07-24 DIAGNOSIS — M960 Pseudarthrosis after fusion or arthrodesis: Secondary | ICD-10-CM

## 2024-07-24 DIAGNOSIS — M48062 Spinal stenosis, lumbar region with neurogenic claudication: Secondary | ICD-10-CM

## 2024-07-24 DIAGNOSIS — M51362 Other intervertebral disc degeneration, lumbar region with discogenic back pain and lower extremity pain: Secondary | ICD-10-CM | POA: Diagnosis not present

## 2024-07-24 DIAGNOSIS — M48061 Spinal stenosis, lumbar region without neurogenic claudication: Secondary | ICD-10-CM | POA: Diagnosis not present

## 2024-07-24 DIAGNOSIS — M51369 Other intervertebral disc degeneration, lumbar region without mention of lumbar back pain or lower extremity pain: Secondary | ICD-10-CM

## 2024-07-24 NOTE — Addendum Note (Signed)
 Addended by: Thurza Kwiecinski on: 07/24/2024 02:10 PM   Modules accepted: Orders

## 2024-07-24 NOTE — Patient Instructions (Addendum)
 Please see below for information in regards to your upcoming surgery:   Planned surgery: L2-5 posterior spinal fusion, L4-5 posterior spinal decompression   Surgery date: 08/24/24 at [facility] (Medical Mall: [address], [place], [place] [address]) - you will find out your arrival time the business day before your surgery.   Pre-op appointment at [facility] Pre-admit Testing: you will receive a call with a date/time for this appointment. If you are scheduled for an in person appointment, Pre-admit Testing is located on the first floor of the [facility] building, [address], [address]. During this appointment, they will advise you which medications you can take the morning of surgery, and which medications you will need to hold for surgery. Labs (such as blood work, EKG) may be done at your pre-op appointment. You are not required to fast for these labs. Should you need to change your pre-op appointment, please call Pre-admit testing at [phone].     Surgical clearance: we will send a clearance form to Dr Shawn [name]. They may wish to see you in their office prior to signing the clearance form. If so, they may call you to schedule an appointment.     Brace: You will need to bring the brace to the hospital on the day of surgery. [facility] will contact you regarding an appointment for the brace you will use after surgery. If it is getting close to your surgery date and you have not received an appointment with [facility], please reach out to us .  Their number is [phone] (for the [facility] location) should you miss their call or have an issue with your brace after surgery.     NSAIDS (Non-steroidal anti-inflammatory drugs): because you are having a fusion, please avoid taking any NSAIDS (examples: ibuprofen, motrin, aleve, naproxen, meloxicam, diclofenac) for 3 months after surgery. Celebrex is an exception and is OK to take, if  prescribed. Tylenol  is not an NSAID.    Common restrictions after spine surgery: No bending, lifting, or twisting ("BLT"). Avoid lifting objects heavier than 10 pounds for the first 6 weeks after surgery. Where possible, avoid household activities that involve lifting, bending, reaching, pushing, or pulling such as laundry, vacuuming, grocery shopping, and childcare. Try to arrange for help from friends and family for these activities while you heal. Do not drive while taking prescription pain medication. Weeks 6 through 12 after surgery: avoid lifting more than 25 pounds.    X-rays after surgery: Because you are having a fusion or arthroplasty: for appointments after your 2 week follow-up: please arrive at the [facility] outpatient imaging center ([address], [address], [place]) or [facility] one hour prior to your appointment for x-rays. This applies to every appointment after your 2 week follow-up. Failure to do so may result in your appointment being rescheduled. *We recently started construction to have x-ray in our office. This may be completed by the time you come in for your 6 week post-op appointment. Please check with us  closer to that time to see if you can have your x-rays at our office*    How to contact us :  If you have any questions/concerns before or after surgery, you can reach us  at [phone], or you can send a mychart message. We can be reached by phone or mychart 8am-4pm, Monday-Friday.  *Please note: Calls after 4pm are forwarded to a third party answering service. Mychart messages are not routinely monitored during evenings, weekends, and  holidays. Please call our office to contact the answering service for urgent concerns during non-business hours.   If you have FMLA/disability paperwork, please drop it off or fax it to [phone]   Appointments/FMLA & disability paperwork: [employer] Registered Nurse/Surgery scheduler: Missi Mcmackin,  RN Certified Medical Assistants: Don, CMA, Elenor, CMA, & Damien, CMA Physician Assistants: Lyle Decamp, PA-C, Edsel Goods, PA-C & Glade Boys, PA-C Surgeons: Penne Sharps, MD & Reeves Daisy, MD   [facility] PREADMIT TESTING VISIT and SURGERY INFORMATION SHEET   Now that surgery has been scheduled you can anticipate several phone calls from [facility] services. A pharmacy technician will call you to verify your current list of medications taken at home.               The Pre-Service Center will call to verify your insurance information and to give you billing estimates and information.             The Preadmit Testing Office will be calling to schedule a visit to obtain information for the anesthesia team and provide instructions on preparation for surgery.  What can you expect for the Preadmit Testing Visit: Appointments may be scheduled in-person or by telephone.  If a telephone visit is scheduled, you may be asked to come into the office to have lab tests or other studies performed.   This visit will not be completed any greater than 14 days prior to your surgery.  If your surgery has been scheduled for a future date, please do not be alarmed if we have not contacted you to schedule an appointment more than a month prior to the surgery date.    Please be prepared to provide the following information during this appointment:            -Personal medical history                                               -Medication and allergy list            -Any history of problems with anesthesia              -Recent lab work or diagnostic studies            -Please notify us  of any needs we should be aware of to provide the best care possible           -You will be provided with instructions on how to prepare for your surgery.    On The Day of Surgery:  You must have a driver to take you home after surgery, you will be asked not to drive for 24 hours  following surgery.  Taxi, Gisele and non-medical transport will not be acceptable means of transportation unless you have a responsible individual who will be traveling with you.  Visitors in the surgical area:   2 people will be able to visit you in your room once your preparation for surgery has been completed. During surgery, your visitors will be asked to wait in the Surgery Waiting Area.  It is not a requirement for them to stay, if they prefer to leave and come back.  Your visitor(s) will be given an update once the surgery has been completed.  No visitors are allowed in the initial recovery room to respect patient privacy and safety.  Once you are more awake and transfer to the secondary recovery area, or are transferred to an inpatient room, visitors will again be able to see you.  To respect and protect your privacy: We will ask on the day of surgery who your driver will be and what the contact number for that individual will be. We will ask if it is okay to share information with this individual, or if there is an alternative individual that we, or the surgeon, should contact to provide updates and information. If family or friends come to the surgical information desk requesting information about you, who you have not listed with us , no information will be given.   It may be helpful to designate someone as the main contact who will be responsible for updating your other friends and family.    PREADMIT TESTING OFFICE: [phone] SAME DAY SURGERY: [phone] We look forward to caring for you before and throughout the process of your surgery.

## 2024-07-25 ENCOUNTER — Other Ambulatory Visit: Payer: Self-pay

## 2024-07-25 DIAGNOSIS — M51369 Other intervertebral disc degeneration, lumbar region without mention of lumbar back pain or lower extremity pain: Secondary | ICD-10-CM

## 2024-07-25 DIAGNOSIS — Z01818 Encounter for other preprocedural examination: Secondary | ICD-10-CM

## 2024-07-25 DIAGNOSIS — M960 Pseudarthrosis after fusion or arthrodesis: Secondary | ICD-10-CM

## 2024-07-25 DIAGNOSIS — M48062 Spinal stenosis, lumbar region with neurogenic claudication: Secondary | ICD-10-CM

## 2024-07-30 ENCOUNTER — Encounter: Payer: Self-pay | Admitting: Neurosurgery

## 2024-08-16 ENCOUNTER — Encounter
Admission: RE | Admit: 2024-08-16 | Discharge: 2024-08-16 | Disposition: A | Discharge status: Home / Self Care | Source: Ambulatory Visit | Attending: Neurosurgery | Admitting: Neurosurgery

## 2024-08-16 ENCOUNTER — Other Ambulatory Visit: Payer: Self-pay

## 2024-08-16 VITALS — BP 139/89 | HR 83 | Resp 12 | Ht 67.0 in | Wt 197.9 lb

## 2024-08-16 DIAGNOSIS — Z01812 Encounter for preprocedural laboratory examination: Secondary | ICD-10-CM | POA: Diagnosis present

## 2024-08-16 DIAGNOSIS — Z01818 Encounter for other preprocedural examination: Secondary | ICD-10-CM

## 2024-08-16 DIAGNOSIS — M51369 Other intervertebral disc degeneration, lumbar region without mention of lumbar back pain or lower extremity pain: Secondary | ICD-10-CM | POA: Insufficient documentation

## 2024-08-16 DIAGNOSIS — M4316 Spondylolisthesis, lumbar region: Secondary | ICD-10-CM | POA: Insufficient documentation

## 2024-08-16 DIAGNOSIS — M48061 Spinal stenosis, lumbar region without neurogenic claudication: Secondary | ICD-10-CM

## 2024-08-16 DIAGNOSIS — M48062 Spinal stenosis, lumbar region with neurogenic claudication: Secondary | ICD-10-CM | POA: Diagnosis not present

## 2024-08-16 DIAGNOSIS — M960 Pseudarthrosis after fusion or arthrodesis: Secondary | ICD-10-CM | POA: Diagnosis not present

## 2024-08-16 HISTORY — DX: Unspecified right bundle-branch block: I45.10

## 2024-08-16 HISTORY — DX: Wedge compression fracture of second lumbar vertebra, initial encounter for closed fracture: S32.020A

## 2024-08-16 HISTORY — DX: Other pulmonary embolism without acute cor pulmonale: I26.99

## 2024-08-16 HISTORY — DX: Prediabetes: R73.03

## 2024-08-16 HISTORY — DX: Atherosclerosis of aorta: I70.0

## 2024-08-16 HISTORY — DX: Spinal stenosis, lumbar region without neurogenic claudication: M48.061

## 2024-08-16 LAB — URINALYSIS, COMPLETE (UACMP) WITH MICROSCOPIC
Bilirubin Urine: NEGATIVE
Glucose, UA: NEGATIVE mg/dL
Hgb urine dipstick: NEGATIVE
Ketones, ur: NEGATIVE mg/dL
Leukocytes,Ua: NEGATIVE
Nitrite: NEGATIVE
Protein, ur: NEGATIVE mg/dL
Specific Gravity, Urine: 1.019 (ref 1.005–1.030)
pH: 5 (ref 5.0–8.0)

## 2024-08-16 LAB — TYPE AND SCREEN
ABO/RH(D): A NEG
Antibody Screen: NEGATIVE

## 2024-08-16 LAB — SURGICAL PCR SCREEN
MRSA, PCR: NEGATIVE
Staphylococcus aureus: NEGATIVE

## 2024-08-16 NOTE — Patient Instructions (Addendum)
 Your procedure is scheduled on:08-24-24 Friday Report to the Registration Desk on the 1st floor of the Medical Mall.Then proceed to the 2nd floor Surgery Desk To find out your arrival time, please call [phone] between 1PM - 3PM on:08-23-24 Thursday If your arrival time is 6:00 am, do not arrive before that time as the Medical Mall entrance doors do not open until 6:00 am.  REMEMBER: Instructions that are not followed completely may result in serious medical risk, up to and including death; or upon the discretion of your surgeon and anesthesiologist your surgery may need to be rescheduled.  Do not eat food after midnight the night before surgery.  No gum chewing or hard candies.  You may however, drink CLEAR liquids up to 2 hours before you are scheduled to arrive for your surgery. Do not drink anything within 2 hours of your scheduled arrival time.  Clear liquids include: - water  - apple juice without pulp - gatorade (not RED colors) - black coffee or tea (Do NOT add milk or creamers to the coffee or tea) Do NOT drink anything that is not on this list.  One week prior to surgery:Stop NOW (08-16-24) Stop ANY OVER THE COUNTER supplements until after surgery.  Continue taking all of your other prescription medications up until the day of surgery.  ON THE DAY OF SURGERY ONLY TAKE THESE MEDICATIONS WITH SIPS OF WATER: -amLODipine  (NORVASC )   No Alcohol for 24 hours before or after surgery.  No Smoking including e-cigarettes for 24 hours before surgery.  No chewable tobacco products for at least 6 hours before surgery.  No nicotine patches on the day of surgery.  Do not use any recreational drugs for at least a week (preferably 2 weeks) before your surgery.  Please be advised that the combination of cocaine and anesthesia may have negative outcomes, up to and including death. If you test positive for cocaine, your surgery will be cancelled.  On the morning of surgery brush  your teeth with toothpaste and water, you may rinse your mouth with mouthwash if you wish. Do not swallow any toothpaste or mouthwash.  Use CHG Soap as directed on instruction sheet.  Do not wear jewelry, make-up, hairpins, clips or nail polish.  For welded (permanent) jewelry: bracelets, anklets, waist bands, etc.  Please have this removed prior to surgery.  If it is not removed, there is a chance that hospital personnel will need to cut it off on the day of surgery.  Do not wear lotions, powders, or perfumes.   Do not shave body hair from the neck down 48 hours before surgery.  Contact lenses, hearing aids and dentures may not be worn into surgery.  Do not bring valuables to the hospital. [facility] is not responsible for any missing/lost belongings or valuables.   Notify your doctor if there is any change in your medical condition (cold, fever, infection).  Wear comfortable clothing (specific to your surgery type) to the hospital.  After surgery, you can help prevent lung complications by doing breathing exercises.  Take deep breaths and cough every 1-2 hours. Your doctor may order a device called an Incentive Spirometer to help you take deep breaths. When coughing or sneezing, hold a pillow firmly against your incision with both hands. This is called "splinting." Doing this helps protect your incision. It also decreases belly discomfort.  If you are being admitted to the hospital overnight, leave your suitcase in the car. After surgery it may be brought  to your room.  In case of increased patient census, it may be necessary for you, the patient, to continue your postoperative care in the Same Day Surgery department.  If you are being discharged the day of surgery, you will not be allowed to drive home. You will need a responsible individual to drive you home and stay with you for 24 hours after surgery.   If you are taking public transportation, you will need to have a  responsible individual with you.  Please call the Pre-admissions Testing Dept. at [phone] if you have any questions about these instructions.  Surgery Visitation Policy:  Patients having surgery or a procedure may have two visitors.  Children under the age of 35 must have an adult with them who is not the patient.  Inpatient Visitation:    Visiting hours are 7 a.m. to 8 p.m. Up to four visitors are allowed at one time in a patient room. The visitors may rotate out with other people during the day.  One visitor age 20 or older may stay with the patient overnight and must be in the room by 8 p.m.    Pre-operative 4 CHG Bath Instructions   You can play a key role in reducing the risk of infection after surgery. Your skin needs to be as free of germs as possible. You can reduce the number of germs on your skin by washing with CHG (chlorhexidine  gluconate) soap before surgery. CHG is an antiseptic soap that kills germs and continues to kill germs even after washing.   DO NOT use if you have an allergy to chlorhexidine /CHG or antibacterial soaps. If your skin becomes reddened or irritated, stop using the CHG and notify one of our RNs at [phone].   Please shower with the CHG soap starting 4 days before surgery using the following schedule:     Please keep in mind the following:  DO NOT shave, including legs and underarms, starting the day of your first shower.   You may shave your face at any point before/day of surgery.  Place clean sheets on your bed the day you start using CHG soap. Use a clean washcloth (not used since being washed) for each shower. DO NOT sleep with pets once you start using the CHG.   CHG Shower Instructions:  If you choose to wash your hair and private area, wash first with your normal shampoo/soap.  After you use shampoo/soap, rinse your hair and body thoroughly to remove shampoo/soap residue.  Turn the water OFF and apply about 3 tablespoons (45 ml)  of CHG soap to a CLEAN washcloth.  Apply CHG soap ONLY FROM YOUR NECK DOWN TO YOUR TOES (washing for 3-5 minutes)  DO NOT use CHG soap on face, private areas, open wounds, or sores.  Pay special attention to the area where your surgery is being performed.  If you are having back surgery, having someone wash your back for you may be helpful. Wait 2 minutes after CHG soap is applied, then you may rinse off the CHG soap.  Pat dry with a clean towel  Put on clean clothes/pajamas   If you choose to wear lotion, please use ONLY the CHG-compatible lotions on the back of this paper.     Additional instructions for the day of surgery: DO NOT APPLY any lotions, deodorants, cologne, or perfumes.   Put on clean/comfortable clothes.  Brush your teeth.  Ask your nurse before applying any prescription medications to the skin.  CHG Compatible Lotions   Aveeno Moisturizing lotion  Cetaphil Moisturizing Cream  Cetaphil Moisturizing Lotion  Clairol Herbal Essence Moisturizing Lotion, Dry Skin  Clairol Herbal Essence Moisturizing Lotion, Extra Dry Skin  Clairol Herbal Essence Moisturizing Lotion, Normal Skin  Curel Age Defying Therapeutic Moisturizing Lotion with Alpha Hydroxy  Curel Extreme Care Body Lotion  Curel Soothing Hands Moisturizing Hand Lotion  Curel Therapeutic Moisturizing Cream, Fragrance-Free  Curel Therapeutic Moisturizing Lotion, Fragrance-Free  Curel Therapeutic Moisturizing Lotion, Original Formula  Eucerin Daily Replenishing Lotion  Eucerin Dry Skin Therapy Plus Alpha Hydroxy Crme  Eucerin Dry Skin Therapy Plus Alpha Hydroxy Lotion  Eucerin Original Crme  Eucerin Original Lotion  Eucerin Plus Crme Eucerin Plus Lotion  Eucerin TriLipid Replenishing Lotion  Keri Anti-Bacterial Hand Lotion  Keri Deep Conditioning Original Lotion Dry Skin Formula Softly Scented  Keri Deep Conditioning Original Lotion, Fragrance Free Sensitive Skin Formula  Keri Lotion Fast Absorbing  Fragrance Free Sensitive Skin Formula  Keri Lotion Fast Absorbing Softly Scented Dry Skin Formula  Keri Original Lotion  Keri Skin Renewal Lotion Keri Silky Smooth Lotion  Keri Silky Smooth Sensitive Skin Lotion  Nivea Body Creamy Conditioning Oil  Nivea Body Extra Enriched Lotion  Nivea Body Original Lotion  Nivea Body Sheer Moisturizing Lotion Nivea Crme  Nivea Skin Firming Lotion  NutraDerm 30 Skin Lotion  NutraDerm Skin Lotion  NutraDerm Therapeutic Skin Cream  NutraDerm Therapeutic Skin Lotion  ProShield Protective Hand Cream  Provon moisturizing lotion  How to Use an Incentive Spirometer An incentive spirometer is a tool that measures how well you are filling your lungs with each breath. Learning to take long, deep breaths using this tool can help you keep your lungs clear and active. This may help to reverse or lessen your chance of developing breathing (pulmonary) problems, especially infection. You may be asked to use a spirometer: After a surgery. If you have a lung problem or a history of smoking. After a long period of time when you have been unable to move or be active. If the spirometer includes an indicator to show the highest number that you have reached, your health care provider or respiratory therapist will help you set a goal. Keep a log of your progress as told by your health care provider. What are the risks? Breathing too quickly may cause dizziness or cause you to pass out. Take your time so you do not get dizzy or light-headed. If you are in pain, you may need to take pain medicine before doing incentive spirometry. It is harder to take a deep breath if you are having pain. How to use your incentive spirometer  Sit up on the edge of your bed or on a chair. Hold the incentive spirometer so that it is in an upright position. Before you use the spirometer, breathe out normally. Place the mouthpiece in your mouth. Make sure your lips are closed tightly around  it. Breathe in slowly and as deeply as you can through your mouth, causing the piston or the ball to rise toward the top of the chamber. Hold your breath for 3-5 seconds, or for as long as possible. If the spirometer includes a coach indicator, use this to guide you in breathing. Slow down your breathing if the indicator goes above the marked areas. Remove the mouthpiece from your mouth and breathe out normally. The piston or ball will return to the bottom of the chamber. Rest for a few seconds, then repeat the steps 10 or more times.  Take your time and take a few normal breaths between deep breaths so that you do not get dizzy or light-headed. Do this every 1-2 hours when you are awake. If the spirometer includes a goal marker to show the highest number you have reached (best effort), use this as a goal to work toward during each repetition. After each set of 10 deep breaths, cough a few times. This will help to make sure that your lungs are clear. If you have an incision on your chest or abdomen from surgery, place a pillow or a rolled-up towel firmly against the incision when you cough. This can help to reduce pain while taking deep breaths and coughing. General tips When you are able to get out of bed: Walk around often. Continue to take deep breaths and cough in order to clear your lungs. Keep using the incentive spirometer until your health care provider says it is okay to stop using it. If you have been in the hospital, you may be told to keep using the spirometer at home. Contact a health care provider if: You are having difficulty using the spirometer. You have trouble using the spirometer as often as instructed. Your pain medicine is not giving enough relief for you to use the spirometer as told. You have a fever. Get help right away if: You develop shortness of breath. You develop a cough with bloody mucus from the lungs. You have fluid or blood coming from an incision site after  you cough. Summary An incentive spirometer is a tool that can help you learn to take long, deep breaths to keep your lungs clear and active. You may be asked to use a spirometer after a surgery, if you have a lung problem or a history of smoking, or if you have been inactive for a long period of time. Use your incentive spirometer as instructed every 1-2 hours while you are awake. If you have an incision on your chest or abdomen, place a pillow or a rolled-up towel firmly against your incision when you cough. This will help to reduce pain. Get help right away if you have shortness of breath, you cough up bloody mucus, or blood comes from your incision when you cough. This information is not intended to replace advice given to you by your health care provider. Make sure you discuss any questions you have with your health care provider. Document Revised: 08/19/2023 Document Reviewed: 08/19/2023 Elsevier Patient Education  2024 [employer].   [employer] Directory to address health-related social needs:  [email or web address]

## 2024-08-24 ENCOUNTER — Encounter: Payer: Self-pay | Admitting: Neurosurgery

## 2024-08-24 ENCOUNTER — Other Ambulatory Visit: Payer: Self-pay

## 2024-08-24 ENCOUNTER — Ambulatory Visit

## 2024-08-24 ENCOUNTER — Ambulatory Visit: Payer: Self-pay | Admitting: Certified Registered Nurse Anesthetist

## 2024-08-24 ENCOUNTER — Encounter
Admission: RE | Disposition: A | Discharge status: Home / Self Care | Payer: Self-pay | Source: Home / Self Care | Attending: Neurosurgery

## 2024-08-24 ENCOUNTER — Observation Stay
Admission: RE | Admit: 2024-08-24 | Discharge: 2024-08-29 | Disposition: A | Discharge status: Home / Self Care | Attending: Neurosurgery | Admitting: Neurosurgery

## 2024-08-24 DIAGNOSIS — M960 Pseudarthrosis after fusion or arthrodesis: Secondary | ICD-10-CM | POA: Insufficient documentation

## 2024-08-24 DIAGNOSIS — Z01818 Encounter for other preprocedural examination: Secondary | ICD-10-CM

## 2024-08-24 DIAGNOSIS — Z981 Arthrodesis status: Principal | ICD-10-CM

## 2024-08-24 DIAGNOSIS — M4316 Spondylolisthesis, lumbar region: Principal | ICD-10-CM | POA: Insufficient documentation

## 2024-08-24 DIAGNOSIS — M48062 Spinal stenosis, lumbar region with neurogenic claudication: Secondary | ICD-10-CM | POA: Diagnosis not present

## 2024-08-24 DIAGNOSIS — M51369 Other intervertebral disc degeneration, lumbar region without mention of lumbar back pain or lower extremity pain: Secondary | ICD-10-CM | POA: Insufficient documentation

## 2024-08-24 DIAGNOSIS — I10 Essential (primary) hypertension: Secondary | ICD-10-CM | POA: Insufficient documentation

## 2024-08-24 DIAGNOSIS — Z79899 Other long term (current) drug therapy: Secondary | ICD-10-CM | POA: Diagnosis not present

## 2024-08-24 HISTORY — PX: APPLICATION OF INTRAOPERATIVE CT SCAN: SHX6668

## 2024-08-24 HISTORY — PX: LUMBAR LAMINECTOMY/DECOMPRESSION MICRODISCECTOMY: SHX5026

## 2024-08-24 SURGERY — POSTERIOR LUMBAR FUSION 3 LEVEL
Anesthesia: General

## 2024-08-24 MED ORDER — LIDOCAINE HCL (PF) 2 % IJ SOLN
INTRAMUSCULAR | Status: AC
  Filled 2024-08-24: qty 5

## 2024-08-24 MED ORDER — KETOROLAC TROMETHAMINE 30 MG/ML IJ SOLN
INTRAMUSCULAR | Status: AC
  Filled 2024-08-24: qty 1

## 2024-08-24 MED ORDER — ESMOLOL HCL 100 MG/10ML IV SOLN
INTRAVENOUS | Status: AC
  Filled 2024-08-24: qty 10

## 2024-08-24 MED ORDER — HYDROMORPHONE HCL 1 MG/ML IJ SOLN: 0.5000 mg | INTRAMUSCULAR | Status: DC | PRN

## 2024-08-24 MED ORDER — ACETAMINOPHEN 500 MG PO TABS
1000.0000 mg | ORAL_TABLET | Freq: Four times a day (QID) | ORAL | Status: DC
  Administered 2024-08-25 – 2024-08-29 (×13): 1000 mg via ORAL
  Filled 2024-08-24 (×15): qty 2

## 2024-08-24 MED ORDER — ARTIFICIAL TEARS OPHTHALMIC OINT
TOPICAL_OINTMENT | OPHTHALMIC | Status: AC
  Filled 2024-08-24: qty 3.5

## 2024-08-24 MED ORDER — ONDANSETRON HCL 4 MG/2ML IJ SOLN
4.0000 mg | Freq: Four times a day (QID) | INTRAMUSCULAR | Status: DC | PRN
  Filled 2024-08-24: qty 2

## 2024-08-24 MED ORDER — VANCOMYCIN HCL IN DEXTROSE 1-5 GM/200ML-% IV SOLN
INTRAVENOUS | Status: AC
  Filled 2024-08-24: qty 200

## 2024-08-24 MED ORDER — KETAMINE HCL 50 MG/5ML IJ SOSY
PREFILLED_SYRINGE | INTRAMUSCULAR | Status: AC
  Filled 2024-08-24: qty 5

## 2024-08-24 MED ORDER — MENTHOL 3 MG MT LOZG: 1.0000 | LOZENGE | OROMUCOSAL | Status: DC | PRN

## 2024-08-24 MED ORDER — POLYETHYLENE GLYCOL 3350 17 G PO PACK
17.0000 g | PACK | Freq: Every day | ORAL | Status: DC | PRN
  Filled 2024-08-24: qty 1

## 2024-08-24 MED ORDER — REMIFENTANIL HCL 1 MG IV SOLR
INTRAVENOUS | Status: AC
  Filled 2024-08-24: qty 1000

## 2024-08-24 MED ORDER — CEFAZOLIN SODIUM-DEXTROSE 2-4 GM/100ML-% IV SOLN
INTRAVENOUS | Status: AC
  Filled 2024-08-24: qty 100

## 2024-08-24 MED ORDER — CHLORHEXIDINE GLUCONATE 0.12 % MT SOLN
15.0000 mL | Freq: Once | OROMUCOSAL | Status: AC
  Administered 2024-08-24: 15 mL via OROMUCOSAL

## 2024-08-24 MED ORDER — PHENYLEPHRINE 80 MCG/ML (10ML) SYRINGE FOR IV PUSH (FOR BLOOD PRESSURE SUPPORT)
PREFILLED_SYRINGE | INTRAVENOUS | Status: AC
  Filled 2024-08-24: qty 10

## 2024-08-24 MED ORDER — ONDANSETRON HCL 4 MG/2ML IJ SOLN: 4.0000 mg | Freq: Once | INTRAMUSCULAR | Status: DC | PRN

## 2024-08-24 MED ORDER — EPHEDRINE 5 MG/ML INJ
INTRAVENOUS | Status: AC
  Filled 2024-08-24: qty 5

## 2024-08-24 MED ORDER — KETOROLAC TROMETHAMINE 15 MG/ML IJ SOLN
7.5000 mg | Freq: Four times a day (QID) | INTRAMUSCULAR | Status: AC
  Administered 2024-08-24 – 2024-08-25 (×5): 7.5 mg via INTRAVENOUS
  Filled 2024-08-24 (×5): qty 1

## 2024-08-24 MED ORDER — FENTANYL CITRATE (PF) 100 MCG/2ML IJ SOLN
INTRAMUSCULAR | Status: AC
  Filled 2024-08-24: qty 2

## 2024-08-24 MED ORDER — KETOROLAC TROMETHAMINE 30 MG/ML IJ SOLN
INTRAMUSCULAR | Status: DC | PRN
  Administered 2024-08-24: 15 mg via INTRAVENOUS

## 2024-08-24 MED ORDER — SURGIFLO WITH THROMBIN (HEMOSTATIC MATRIX KIT) OPTIME
TOPICAL | Status: DC | PRN
  Administered 2024-08-24 (×2): 1 via TOPICAL

## 2024-08-24 MED ORDER — VANCOMYCIN HCL 1000 MG IV SOLR
INTRAVENOUS | Status: DC | PRN
  Administered 2024-08-24: 1000 mg via INTRAVENOUS

## 2024-08-24 MED ORDER — ROSUVASTATIN CALCIUM 10 MG PO TABS
10.0000 mg | ORAL_TABLET | Freq: Every day | ORAL | Status: DC
  Administered 2024-08-25 – 2024-08-29 (×5): 10 mg via ORAL
  Filled 2024-08-24 (×5): qty 1

## 2024-08-24 MED ORDER — GLYCOPYRROLATE 0.2 MG/ML IJ SOLN
INTRAMUSCULAR | Status: DC | PRN
  Administered 2024-08-24 (×2): .2 mg via INTRAVENOUS

## 2024-08-24 MED ORDER — SUCCINYLCHOLINE CHLORIDE 200 MG/10ML IV SOSY
PREFILLED_SYRINGE | INTRAVENOUS | Status: DC | PRN
  Administered 2024-08-24: 50 mg via INTRAVENOUS

## 2024-08-24 MED ORDER — ONDANSETRON HCL 4 MG/2ML IJ SOLN
INTRAMUSCULAR | Status: DC | PRN
  Administered 2024-08-24: 4 mg via INTRAVENOUS

## 2024-08-24 MED ORDER — ACETAMINOPHEN 10 MG/ML IV SOLN: 1000.0000 mg | Freq: Once | INTRAVENOUS | Status: DC | PRN

## 2024-08-24 MED ORDER — DEXMEDETOMIDINE HCL IN NACL 80 MCG/20ML IV SOLN
INTRAVENOUS | Status: DC | PRN
  Administered 2024-08-24 (×3): 4 ug via INTRAVENOUS

## 2024-08-24 MED ORDER — SODIUM CHLORIDE 0.9% FLUSH: 3.0000 mL | INTRAVENOUS | Status: DC | PRN

## 2024-08-24 MED ORDER — BUPIVACAINE-EPINEPHRINE (PF) 0.5% -1:200000 IJ SOLN
INTRAMUSCULAR | Status: DC | PRN
  Administered 2024-08-24: 10 mL

## 2024-08-24 MED ORDER — HYDROMORPHONE HCL 1 MG/ML IJ SOLN
INTRAMUSCULAR | Status: DC | PRN
  Administered 2024-08-24 (×2): .5 mg via INTRAVENOUS

## 2024-08-24 MED ORDER — SODIUM CHLORIDE (PF) 0.9 % IJ SOLN
INTRAMUSCULAR | Status: DC | PRN
  Administered 2024-08-24: 60 mL

## 2024-08-24 MED ORDER — PROPOFOL 10 MG/ML IV BOLUS
INTRAVENOUS | Status: AC
  Filled 2024-08-24: qty 20

## 2024-08-24 MED ORDER — METHOCARBAMOL 500 MG PO TABS
500.0000 mg | ORAL_TABLET | Freq: Four times a day (QID) | ORAL | Status: DC | PRN
  Administered 2024-08-24: 500 mg via ORAL

## 2024-08-24 MED ORDER — GLYCOPYRROLATE 0.2 MG/ML IJ SOLN
INTRAMUSCULAR | Status: AC
  Filled 2024-08-24: qty 1

## 2024-08-24 MED ORDER — 0.9 % SODIUM CHLORIDE (POUR BTL) OPTIME
TOPICAL | Status: DC | PRN
  Administered 2024-08-24: 275 mL
  Administered 2024-08-24: 500 mL

## 2024-08-24 MED ORDER — VANCOMYCIN HCL 1 G IV SOLR
INTRAVENOUS | Status: DC | PRN
  Administered 2024-08-24: 1000 mg via TOPICAL

## 2024-08-24 MED ORDER — SODIUM CHLORIDE 0.9% FLUSH
3.0000 mL | Freq: Two times a day (BID) | INTRAVENOUS | Status: DC
  Administered 2024-08-24 – 2024-08-28 (×6): 3 mL via INTRAVENOUS

## 2024-08-24 MED ORDER — ONDANSETRON HCL 4 MG/2ML IJ SOLN
INTRAMUSCULAR | Status: AC
  Filled 2024-08-24: qty 2

## 2024-08-24 MED ORDER — PHENYLEPHRINE HCL-NACL 20-0.9 MG/250ML-% IV SOLN
INTRAVENOUS | Status: AC
  Filled 2024-08-24: qty 250

## 2024-08-24 MED ORDER — ACETAMINOPHEN 10 MG/ML IV SOLN
INTRAVENOUS | Status: AC
  Filled 2024-08-24: qty 100

## 2024-08-24 MED ORDER — DEXAMETHASONE SOD PHOSPHATE PF 10 MG/ML IJ SOLN
INTRAMUSCULAR | Status: DC | PRN
  Administered 2024-08-24: 10 mg via INTRAVENOUS

## 2024-08-24 MED ORDER — FENTANYL CITRATE (PF) 100 MCG/2ML IJ SOLN
INTRAMUSCULAR | Status: DC | PRN
  Administered 2024-08-24 (×2): 50 ug via INTRAVENOUS

## 2024-08-24 MED ORDER — OXYCODONE HCL 5 MG PO TABS
5.0000 mg | ORAL_TABLET | ORAL | Status: DC | PRN
  Administered 2024-08-26 (×2): 5 mg via ORAL
  Filled 2024-08-24 (×3): qty 1

## 2024-08-24 MED ORDER — HYDROMORPHONE HCL 1 MG/ML IJ SOLN
INTRAMUSCULAR | Status: AC
  Filled 2024-08-24: qty 1

## 2024-08-24 MED ORDER — PHENYLEPHRINE 80 MCG/ML (10ML) SYRINGE FOR IV PUSH (FOR BLOOD PRESSURE SUPPORT)
PREFILLED_SYRINGE | INTRAVENOUS | Status: DC | PRN
  Administered 2024-08-24 (×4): 80 ug via INTRAVENOUS

## 2024-08-24 MED ORDER — EPHEDRINE SULFATE-NACL 50-0.9 MG/10ML-% IV SOSY
PREFILLED_SYRINGE | INTRAVENOUS | Status: DC | PRN
  Administered 2024-08-24 (×2): 5 mg via INTRAVENOUS

## 2024-08-24 MED ORDER — ENOXAPARIN SODIUM 40 MG/0.4ML IJ SOSY
40.0000 mg | PREFILLED_SYRINGE | INTRAMUSCULAR | Status: DC
  Administered 2024-08-25 – 2024-08-29 (×5): 40 mg via SUBCUTANEOUS
  Filled 2024-08-24 (×5): qty 0.4

## 2024-08-24 MED ORDER — SODIUM CHLORIDE 0.9 % IV SOLN: 250.0000 mL | INTRAVENOUS | Status: AC

## 2024-08-24 MED ORDER — CHLORHEXIDINE GLUCONATE 0.12 % MT SOLN
OROMUCOSAL | Status: AC
  Filled 2024-08-24: qty 15

## 2024-08-24 MED ORDER — FENTANYL CITRATE (PF) 100 MCG/2ML IJ SOLN
25.0000 ug | INTRAMUSCULAR | Status: DC | PRN
  Administered 2024-08-24 (×5): 25 ug via INTRAVENOUS

## 2024-08-24 MED ORDER — LACTATED RINGERS IV SOLN: INTRAVENOUS | Status: DC

## 2024-08-24 MED ORDER — ORAL CARE MOUTH RINSE: 15.0000 mL | Freq: Once | OROMUCOSAL | Status: AC

## 2024-08-24 MED ORDER — SODIUM CHLORIDE (PF) 0.9 % IJ SOLN
INTRAMUSCULAR | Status: AC
  Filled 2024-08-24: qty 20

## 2024-08-24 MED ORDER — METHOCARBAMOL 1000 MG/10ML IJ SOLN: 500.0000 mg | Freq: Four times a day (QID) | INTRAMUSCULAR | Status: DC | PRN

## 2024-08-24 MED ORDER — CEFAZOLIN IN SODIUM CHLORIDE 2-0.9 GM/100ML-% IV SOLN
2.0000 g | Freq: Once | INTRAVENOUS | Status: AC
  Administered 2024-08-24: 2 g via INTRAVENOUS

## 2024-08-24 MED ORDER — ONDANSETRON HCL 4 MG PO TABS: 4.0000 mg | ORAL_TABLET | Freq: Four times a day (QID) | ORAL | Status: DC | PRN

## 2024-08-24 MED ORDER — PHENOL 1.4 % MT LIQD: 1.0000 | OROMUCOSAL | Status: DC | PRN

## 2024-08-24 MED ORDER — REMIFENTANIL HCL 1 MG IV SOLR
INTRAVENOUS | Status: DC | PRN
  Administered 2024-08-24 (×2): 75 ug via INTRAVENOUS
  Administered 2024-08-24: .08 ug/kg/min via INTRAVENOUS
  Administered 2024-08-24: .1 ug/kg/min via INTRAVENOUS

## 2024-08-24 MED ORDER — ESMOLOL HCL 100 MG/10ML IV SOLN
INTRAVENOUS | Status: DC | PRN
  Administered 2024-08-24 (×3): 20 mg via INTRAVENOUS

## 2024-08-24 MED ORDER — KETAMINE HCL 50 MG/5ML IJ SOSY
PREFILLED_SYRINGE | INTRAMUSCULAR | Status: DC | PRN
  Administered 2024-08-24 (×5): 10 mg via INTRAVENOUS

## 2024-08-24 MED ORDER — DOCUSATE SODIUM 100 MG PO CAPS
100.0000 mg | ORAL_CAPSULE | Freq: Two times a day (BID) | ORAL | Status: DC
  Administered 2024-08-24 – 2024-08-29 (×5): 100 mg via ORAL
  Filled 2024-08-24 (×9): qty 1

## 2024-08-24 MED ORDER — SODIUM CHLORIDE 0.9 % IV SOLN: INTRAVENOUS | Status: AC

## 2024-08-24 MED ORDER — AMLODIPINE BESYLATE 10 MG PO TABS
10.0000 mg | ORAL_TABLET | Freq: Every day | ORAL | Status: DC
  Administered 2024-08-25: 10 mg via ORAL
  Filled 2024-08-24: qty 1

## 2024-08-24 MED ORDER — LIDOCAINE HCL (CARDIAC) PF 100 MG/5ML IV SOSY
PREFILLED_SYRINGE | INTRAVENOUS | Status: DC | PRN
  Administered 2024-08-24: 100 mg via INTRAVENOUS

## 2024-08-24 MED ORDER — VANCOMYCIN HCL IN DEXTROSE 1-5 GM/200ML-% IV SOLN
1000.0000 mg | Freq: Once | INTRAVENOUS | Status: AC
  Administered 2024-08-24: 1000 mg via INTRAVENOUS

## 2024-08-24 MED ORDER — ACETAMINOPHEN 10 MG/ML IV SOLN
INTRAVENOUS | Status: DC | PRN
  Administered 2024-08-24: 1000 mg via INTRAVENOUS

## 2024-08-24 MED ORDER — MAGNESIUM CITRATE PO SOLN: 1.0000 | Freq: Once | ORAL | Status: DC | PRN

## 2024-08-24 MED ORDER — SORBITOL 70 % SOLN: 30.0000 mL | Freq: Every day | Status: DC | PRN

## 2024-08-24 MED ORDER — SENNA 8.6 MG PO TABS
1.0000 | ORAL_TABLET | Freq: Two times a day (BID) | ORAL | Status: DC
  Administered 2024-08-25 (×2): 8.6 mg via ORAL
  Filled 2024-08-24 (×3): qty 1

## 2024-08-24 MED ORDER — PROPOFOL 10 MG/ML IV BOLUS
INTRAVENOUS | Status: DC | PRN
  Administered 2024-08-24 (×2): 50 mg via INTRAVENOUS
  Administered 2024-08-24 (×2): 30 mg via INTRAVENOUS
  Administered 2024-08-24: 150 mg via INTRAVENOUS

## 2024-08-24 MED ORDER — PHENYLEPHRINE HCL-NACL 20-0.9 MG/250ML-% IV SOLN
INTRAVENOUS | Status: DC | PRN
  Administered 2024-08-24: 20 ug/min via INTRAVENOUS

## 2024-08-24 MED ORDER — SUCCINYLCHOLINE CHLORIDE 200 MG/10ML IV SOSY
PREFILLED_SYRINGE | INTRAVENOUS | Status: AC
  Filled 2024-08-24: qty 10

## 2024-08-24 MED ORDER — OXYCODONE HCL 5 MG PO TABS: 5.0000 mg | ORAL_TABLET | Freq: Once | ORAL | Status: DC | PRN

## 2024-08-24 MED ORDER — OXYCODONE HCL 5 MG PO TABS
10.0000 mg | ORAL_TABLET | ORAL | Status: DC | PRN
  Administered 2024-08-27 – 2024-08-29 (×4): 10 mg via ORAL
  Filled 2024-08-24 (×4): qty 2

## 2024-08-24 MED ORDER — OXYCODONE HCL 5 MG/5ML PO SOLN: 5.0000 mg | Freq: Once | ORAL | Status: DC | PRN

## 2024-08-24 MED ORDER — IRRISEPT - 450ML BOTTLE WITH 0.05% CHG IN STERILE WATER, USP 99.95% OPTIME
TOPICAL | Status: DC | PRN
  Administered 2024-08-24: 450 mL

## 2024-08-24 MED ORDER — METHOCARBAMOL 500 MG PO TABS
ORAL_TABLET | ORAL | Status: AC
  Filled 2024-08-24: qty 1

## 2024-08-24 SURGICAL SUPPLY — 52 items
ALLOGRAFT BONESTRP KORE 2.5X10 (Bone Implant) IMPLANT
BASIN KIT SINGLE STR (MISCELLANEOUS) ×1 IMPLANT
BLADE BOVIE TIP EXT 4 (BLADE) IMPLANT
BUR NEURO DRILL SOFT 3.0X3.8M (BURR) ×1 IMPLANT
CEMENT MAX VISCOSITY NUVA (Cement) IMPLANT
CONNECTOR RELINE 20MM OPEN OFF (Connector) IMPLANT
DERMABOND ADVANCED .7 DNX12 (GAUZE/BANDAGES/DRESSINGS) ×1 IMPLANT
DRAPE C ARM PK CFD 31 SPINE (DRAPES) ×1 IMPLANT
DRAPE C-ARMOR (DRAPES) IMPLANT
DRAPE LAPAROTOMY 100X77 ABD (DRAPES) ×1 IMPLANT
DRAPE SCAN PATIENT (DRAPES) ×1 IMPLANT
DRAPE SPINE LEICA/WILD 54X150 (DRAPES) ×1 IMPLANT
DRAPE TABLE BACK 80X90 (DRAPES) ×1 IMPLANT
DRSG OPSITE POSTOP 3X4 (GAUZE/BANDAGES/DRESSINGS) ×1 IMPLANT
DRSG TEGADERM 4X4.75 (GAUZE/BANDAGES/DRESSINGS) ×1 IMPLANT
ELECTRODE EZSTD 165MM 6.5IN (MISCELLANEOUS) ×1 IMPLANT
ELECTRODE REM PT RTRN 9FT ADLT (ELECTROSURGICAL) ×1 IMPLANT
EVACUATOR 1/8 PVC DRAIN (DRAIN) IMPLANT
FEE CVG SUPP BRAINLAB NG SPNE (MISCELLANEOUS) ×1 IMPLANT
GLOVE BIOGEL PI IND STRL 6.5 (GLOVE) ×1 IMPLANT
GLOVE SURG SYN 6.5 PF PI (GLOVE) ×2 IMPLANT
GLOVE SURG SYN 8.5 PF PI (GLOVE) ×3 IMPLANT
GOWN SRG LRG LVL 4 IMPRV REINF (GOWNS) ×1 IMPLANT
GOWN SRG XL LVL 3 NONREINFORCE (GOWNS) ×1 IMPLANT
HOLDER FOLEY CATH W/STRAP (MISCELLANEOUS) ×1 IMPLANT
KIT INFUSE LRG II (Orthopedic Implant) IMPLANT
KIT PREVENA INCISION MGT20CM45 (CANNISTER) IMPLANT
KIT SPINAL PRONEVIEW (KITS) ×1 IMPLANT
LAVAGE JET IRRISEPT WOUND (IRRIGATION / IRRIGATOR) ×1 IMPLANT
MANIFOLD NEPTUNE II (INSTRUMENTS) ×1 IMPLANT
MARKER SKIN DUAL TIP RULER LAB (MISCELLANEOUS) IMPLANT
MARKER SPHERE PSV REFLC 13MM (MARKER) ×7 IMPLANT
NDL AND PUSHER RELINE MAS (NEEDLE) IMPLANT
NDL SAFETY ECLIP 18X1.5 (MISCELLANEOUS) ×1 IMPLANT
NS IRRIG 500ML POUR BTL (IV SOLUTION) ×1 IMPLANT
PACK LAMINECTOMY ARMC (PACKS) ×1 IMPLANT
PAD ARMBOARD POSITIONER FOAM (MISCELLANEOUS) ×1 IMPLANT
ROD RELINE 5.5X90MM LORDOTIC (Rod) IMPLANT
SCREW LOCK RELINE 5.5 TULIP (Screw) IMPLANT
SCREW PA RELINE 7.5X55 (Screw) IMPLANT
SCREW PA RELINE 8.5X55 (Screw) IMPLANT
STAPLER SKIN PROX 35W (STAPLE) IMPLANT
SURGIFLO W/THROMBIN 8M KIT (HEMOSTASIS) ×1 IMPLANT
SUT VIC AB 0 CT1 27XCR 8 STRN (SUTURE) ×2 IMPLANT
SUT VIC AB 2-0 CT1 18 (SUTURE) ×2 IMPLANT
SUTURE EHLN 3-0 FS-10 30 BLK (SUTURE) IMPLANT
SYR 10ML LL (SYRINGE) ×1 IMPLANT
SYR 30ML LL (SYRINGE) ×2 IMPLANT
SYR 3ML LL SCALE MARK (SYRINGE) ×1 IMPLANT
SYS RELINE MAS CMNT GUIDE QC (NEEDLE) IMPLANT
TIP FENS REPLACE RELINE (MISCELLANEOUS) IMPLANT
TRAP FLUID SMOKE EVACUATOR (MISCELLANEOUS) ×1 IMPLANT

## 2024-08-24 NOTE — Anesthesia Preprocedure Evaluation (Addendum)
 Anesthesia Evaluation  Patient identified by MRN, date of birth, ID band Patient awake    Reviewed: Allergy & Precautions, NPO status , Patient's Chart, lab work & pertinent test results  History of Anesthesia Complications (+) PROLONGED EMERGENCE and history of anesthetic complications  Airway Mallampati: III   Neck ROM: Full    Dental  (+) Missing   Pulmonary neg pulmonary ROS   Pulmonary exam normal breath sounds clear to auscultation       Cardiovascular hypertension, Normal cardiovascular exam Rhythm:Regular Rate:Normal  Hx PE   ECG 08/14/24:  Sinus rhythm  Right bundle branch block  Left anterior fascicular block  Bifascicular block  When compared with ECG of 27-Sep-2022 13 34,  No significant change was found    Neuro/Psych negative neurological ROS     GI/Hepatic negative GI ROS,,,  Endo/Other  negative endocrine ROS    Renal/GU negative Renal ROS     Musculoskeletal   Abdominal   Peds  Hematology negative hematology ROS (+)   Anesthesia Other Findings   Reproductive/Obstetrics                              Anesthesia Physical Anesthesia Plan  ASA: 2  Anesthesia Plan: General   Post-op Pain Management:    Induction: Intravenous  PONV Risk Score and Plan: 3 and Ondansetron , Dexamethasone  and Treatment may vary due to age or medical condition  Airway Management Planned: Oral ETT  Additional Equipment: Arterial line  Intra-op Plan:   Post-operative Plan: Extubation in OR  Informed Consent: I have reviewed the patients History and Physical, chart, labs and discussed the procedure including the risks, benefits and alternatives for the proposed anesthesia with the patient or authorized representative who has indicated his/her understanding and acceptance.     Dental advisory given  Plan Discussed with: CRNA  Anesthesia Plan Comments: (Patient consented for  risks of anesthesia including but not limited to:  - adverse reactions to medications - damage to eyes, teeth, lips or other oral mucosa - nerve damage due to positioning  - sore throat or hoarseness - damage to heart, brain, nerves, lungs, other parts of body or loss of life  Informed patient about role of CRNA in peri- and intra-operative care.  Patient voiced understanding.)         Anesthesia Quick Evaluation

## 2024-08-24 NOTE — Anesthesia Postprocedure Evaluation (Signed)
 Anesthesia Post Note  Patient: CHRISTABEL CAMIRE  Procedure(s) Performed: POSTERIOR LUMBAR FUSION 3 LEVEL LUMBAR LAMINECTOMY/DECOMPRESSION MICRODISCECTOMY 1 LEVEL APPLICATION OF INTRAOPERATIVE CT SCAN  Patient location during evaluation: PACU Anesthesia Type: General Level of consciousness: awake and alert, oriented and patient cooperative Pain management: pain level controlled Vital Signs Assessment: post-procedure vital signs reviewed and stable Respiratory status: spontaneous breathing, nonlabored ventilation and respiratory function stable Cardiovascular status: blood pressure returned to baseline and stable Postop Assessment: adequate PO intake Anesthetic complications: no   No notable events documented.   Last Vitals:  Vitals:   08/24/24 1430 08/24/24 1445  BP: 128/65 (!) 138/53  Pulse: 84 81  Resp: (!) 28 (!) 28  Temp:    SpO2: 100% 99%    Last Pain:  Vitals:   08/24/24 1430  TempSrc:   PainSc: 7                  Alfonso Ruths

## 2024-08-24 NOTE — Anesthesia Procedure Notes (Signed)
 Procedure Name: Intubation Date/Time: 08/24/2024 9:39 AM  Performed by: Trudy Rankin LABOR, CRNAPre-anesthesia Checklist: Patient identified, Patient being monitored, Timeout performed, Emergency Drugs available and Suction available Patient Re-evaluated:Patient Re-evaluated prior to induction Oxygen Delivery Method: Circle System Utilized Preoxygenation: Pre-oxygenation with 100% oxygen Induction Type: IV induction and Rapid sequence Laryngoscope Size: Mac, 3 and McGrath Grade View: Grade I Tube type: Oral Tube size: 7.0 mm Number of attempts: 1 Airway Equipment and Method: Stylet Placement Confirmation: ETT inserted through vocal cords under direct vision, positive ETCO2 and breath sounds checked- equal and bilateral Secured at: 20 cm Tube secured with: Tape Dental Injury: Teeth and Oropharynx as per pre-operative assessment

## 2024-08-24 NOTE — Discharge Instructions (Addendum)
  Your surgeon has performed an operation on your lumbar spine (low back) to relieve pressure on one or more nerves. Many times, patients feel better immediately after surgery and can "overdo it." Even if you feel well, it is important that you follow these activity guidelines. If you do not let your back heal properly from the surgery, you can increase the chance of hardware complications and/or return of your symptoms. The following are instructions to help in your recovery once you have been discharged from the hospital.  Do not use NSAIDs for 3 months after surgery.  *Regarding compression stockings-  Please wear day and night until you are walking a couple hundred feet three times a day.   Activity    No bending, lifting, or twisting ("BLT"). Avoid lifting objects heavier than 10 pounds (gallon milk jug).  Where possible, avoid household activities that involve lifting, bending, pushing, or pulling such as laundry, vacuuming, grocery shopping, and childcare. Try to arrange for help from friends and family for these activities while your back heals.  Increase physical activity slowly as tolerated.  Taking short walks is encouraged, but avoid strenuous exercise. Do not jog, run, bicycle, lift weights, or participate in any other exercises unless specifically allowed by your doctor. Avoid prolonged sitting, including car rides.  Talk to your doctor before resuming sexual activity.  You should not drive until cleared by your doctor.  Until released by your doctor, you should not return to work or school.  You should rest at home and let your body heal.   You may shower three days after your surgery.  After showering, lightly dab your incision dry. Do not take a tub bath or go swimming for 3 weeks, or until approved by your doctor at your follow-up appointment.  If you smoke, we strongly recommend that you quit.  Smoking has been proven to interfere with normal healing in your back and will  dramatically reduce the success rate of your surgery. Please contact [facility] (800-QUIT-NOW) and use the resources at www.[facility].com for assistance in stopping smoking.  Surgical Incision   Remove your dressing on 08/30/2024. OK to leave open to air after that.  Staples will be removed at your postop.  Diet            You may return to your usual diet. Be sure to stay hydrated.  When to Contact Us   Although your surgery and recovery will likely be uneventful, you may have some residual numbness, aches, and pains in your back and/or legs. This is normal and should improve in the next few weeks.  However, should you experience any of the following, contact us  immediately: New numbness or weakness Pain that is progressively getting worse, and is not relieved by your pain medications or rest Bleeding, redness, swelling, pain, or drainage from surgical incision Chills or flu-like symptoms Fever greater than 101.0 F (38.3 C) Problems with bowel or bladder functions Difficulty breathing or shortness of breath Warmth, tenderness, or swelling in your calf  Contact Information How to contact us :  If you have any questions/concerns before or after surgery, you can reach us  at [phone], or you can send a mychart message. We can be reached by phone or mychart 8am-4pm, Monday-Friday.  *Please note: Calls after 4pm are forwarded to a third party answering service. Mychart messages are not routinely monitored during evenings, weekends, and holidays. Please call our office to contact the answering service for urgent concerns during non-business hours.

## 2024-08-24 NOTE — Op Note (Addendum)
 Indications: Ms. 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 The patient tried and failed conservative management, prompting surgical intervention.  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   Preoperative Note:   Risks of surgery discussed include: infection, bleeding, stroke, coma, death, paralysis, CSF leak, nerve/spinal cord injury, numbness, tingling, weakness, complex regional pain syndrome, recurrent stenosis and/or disc herniation, vascular injury, development of instability, neck/back pain, need for further surgery, persistent symptoms, development of deformity, and the risks of anesthesia. The patient understood these risks and agreed to proceed.  Operative Note:  1. L4/5 decompression 2. Posterolateral arthrodesis L2 to L5 3. Posterior segmental instrumentation L2 to L5 using Nuvasive Reline implants 4. Harvesting of autograft via the same incision 5. Use of stereotaxis    The patient was brought to the Operating Room, intubated and turned into the prone position. All pressure points were checked and double checked. Flouroscopy was used to mark the incision. The patient was prepped and draped in the standard fashion. A full timeout was performed. Preoperative antibiotics were given. The incision was injected with local anesthetic.  The incision was opened with a scalpel, then the soft tissues divided with  the Bovie. Self-retaining retractors were placed. The paraspinus muscles were reflected laterally in subperiosteal fashion until the transverse processes were visible.   The prior implants were identified and dissected free.  They were then removed and the sizes recorded.  No obvious effusion was noted.  The stereotactic array was then placed.  Ceretec images were acquired.  We then used stereotactically guided drill guides to cannulate the pedicles bilaterally at L5.  We utilized the drill to lengthen the screw tracts at L2, L3, and L4.     The pedicle screws were placed.  7.5x55  mm Nuvasive Reline screws were used at L3 and L5.  8.5 x 55 mm screws were used at L2 and L4.  Fenestrated screws were used throughout.  We then brought the stereotactic C arm back into the field to confirm placement of implants.  All implants were in good position with clear position of the fenestrations within the vertebral body.  We then cement augmented the screws at each level.  Rods were measured and shaped.  These were secured to the screws according to manufacturer specifications.  To decompress the neural elements at L4/5, the drill was used to perform a laminoforaminotomy on the right side.  The right L4 inferior articulating process was fully removed and handed off for use as autograft. Additionally, the drill was used to removed the base of the spinous process and the contralateral lamina until the contralateral medial facet was identified and palpated. Using a [facility] and curettes, the ligamentum flavum at L4/5 was dissected completely free of the dura. Using punches and curettes, the ligamentum flavum was removed in its entirety at L4/5 for a complete decompression from facet to facet.  The contralateral L5 nerve root was palpated, and the Kerrison punch used to remove the remainder of the medial facet and foramen until the dural sac and nerve roots were complete  decompressed.  The wound was copiously  irrigated, then the external surfaces of the remaining lamina, facet, and transverse processes from L2 to L5 were decorticated. A mixture of allograft, BMP and autograft was placed over the decorticated surfaces for arthrodesis.  A drain was placed subfascially.   After hemostasis, the wound was closed in layers with 0 and 2-0 vicryl. Staples were applied to the incision.  The patient was then flipped supine and extubated with incident. All counts were correct times 2 at the end of the case. No immediate complications were noted.  I performed the entire procedure.     Reeves Daisy MD

## 2024-08-24 NOTE — H&P (Signed)
 Referring Physician:  Clois Fret, MD [address] [place],  [place] [address]  Primary Physician:  [name], [name], DO  History of Present Illness: 08/24/2024 Mary Mullins presents today for surgical intervention.    07/24/2024  Mary Mullins is here today with a chief complaint of worsening back and leg pain.  She experiences severe lower back pain radiating down her legs, with a sensation of her muscles turning into 'concrete'. The pain worsens when standing from a sitting position, requiring small steps before walking normally. She can walk about ten feet before needing support. Without a shopping cart, she is unable to walk for more than a few minutes.  Physical therapy twice a week provides temporary relief, but pain returns after sitting in her car. Multiple injections have been ineffective.   Back pain is more prominent than leg pain, starting in the lower back and traveling down her legs, sometimes reaching her ankles. Significant pain is also present in the buttocks. She has a history of a compression fracture and underwent a myelogram two months ago. An MRI was performed a year ago.    Bowel/Bladder Dysfunction: none  Conservative measures: HEP Physical therapy: Is participating with [facility]-started 01/2024-still going. Multimodal medical therapy including regular antiinflammatories: Robaxin , Hydrocodone , Gabapentin, Oxycodone . Injections: 03/26/2024-ESI Right L4-L5 02/13/2024--Caudal Epidural injection 01/17/2024--MBB Bilateral T12-L1 L1-L2 03/28/2023--MBBB-Bilateral L4-L5 L5-S1 08/19/2022--TFESI Bilateral L2-L3  Past Surgery: 10/13/2021---LUMBAR LAMINECTOMY/DECOMPRESSION MICRODISCECTOMY -L2-L4 by Dr Carollee 10/19/2022- posterior lumbar fusion L2-L4-revision decompression by Dr Dawley  I have utilized the care everywhere function in epic to review the outside records available from external health systems.   Review of Systems:   A 10 point review of systems is negative, except for the pertinent positives and negatives detailed in the HPI.  Past Medical History: Past Medical History:  Diagnosis Date   Aortic atherosclerosis    Complication of anesthesia    Delayed emergence after wrist surgery   Compression fracture of L2 (HCC)    Dyslipidemia    Hypertension    Lumbar spinal stenosis    Neuromuscular disorder (HCC)    Right shoulder 80 percent use    Pre-diabetes    Pulmonary embolism (HCC)    Pulmonary nodule    8 mm pleural based LLL nodule, stabel 05/13/21 CT ([facility])   RBBB (right bundle branch block)     Past Surgical History: Past Surgical History:  Procedure Laterality Date   ANKLE FRACTURE SURGERY     L ankle   BACK SURGERY  10/19/2022   Revision decompression, posterior lateral instrumentation adn fusion- L2-3, L3-4 Dr Dawley   CATARACT EXTRACTION W/ INTRAOCULAR LENS IMPLANT Bilateral    ENTEROCELE REPAIR  08/21/2021   LUMBAR FUSION     LUMBAR LAMINECTOMY/DECOMPRESSION MICRODISCECTOMY N/A 10/13/2021   Procedure: OPEN LAMINECTOMY, LUMBAR TWO-THREE, LUMBAR  THREE-FOUR;  Surgeon: Dawley, Lani BROCKS, DO;  Location: [facility] OR;  Service: Neurosurgery;  Laterality: N/A;   TONSILLECTOMY     TRIGGER FINGER RELEASE Right 02/17/2015   Procedure:  RIGHT LONG FINGER AND RING FINGER TRIGGER RELEASE;  Surgeon: Alm Hummer, MD;  Location: [facility];  Service: Orthopedics;  Laterality: Right;   WRIST SURGERY Right 10/25/2005    Allergies: Allergies as of 07/25/2024   (No Known Allergies)    Medications:  Current Facility-Administered Medications:    ceFAZolin  (ANCEF ) IVPB 2g/100 mL premix, 2 g, Intravenous, Once, Clois Fret, MD   lactated ringers  infusion, , Intravenous, Continuous, Piscitello, Fairy POUR, MD, Last  Rate: 10 mL/hr at 08/24/24 0807, New Bag at 08/24/24 0807   vancomycin (VANCOCIN) IVPB 1000 mg/200 mL premix, 1,000 mg, Intravenous, Once, Clois Fret, MD, Last  Rate: 200 mL/hr at 08/24/24 0807, 1,000 mg at 08/24/24 0807  Social History: Social History   Tobacco Use   Smoking status: Never    Passive exposure: Never   Smokeless tobacco: Never  Vaping Use   Vaping status: Never Used  Substance Use Topics   Alcohol use: Yes    Comment: 1 glass of wine once monthly   Drug use: No    Family Medical History: Family History  Problem Relation Age of Onset   Heart Problems Mother    Cirrhosis Father     Physical Examination: Vitals:   08/24/24 0748  BP: (!) 153/84  Pulse: 85  Resp: 18  Temp: (!) 97.5 F (36.4 C)  SpO2: 95%   Heart sounds normal no MRG. Chest Clear to Auscultation Bilaterally.   General: Patient is in no apparent distress. Attention to examination is appropriate.  Neck:   Supple.  Full range of motion.  Respiratory: Patient is breathing without any difficulty.   NEUROLOGICAL:     Awake, alert, oriented to person, place, and time.  Speech is clear and fluent.   Cranial Nerves: Pupils equal round and reactive to light.  Facial tone is symmetric.  Facial sensation is symmetric. Shoulder shrug is symmetric. Tongue protrusion is midline.  There is no pronator drift.  Strength: Side Biceps Triceps Deltoid Interossei Grip Wrist Ext. Wrist Flex.  R 5 5 5 5 5 5 5   L 5 5 5 5 5 5 5    Side Iliopsoas Quads Hamstring PF DF EHL  R 5 5 5 5 5 5   L 5 5 5 5 5 5    Reflexes are 1+ and symmetric at the biceps, triceps, brachioradialis, patella and achilles.   Hoffman's is absent.   Bilateral upper and lower extremity sensation is intact to light touch.    No evidence of dysmetria noted.  Gait is untested.     Medical Decision Making  Imaging: CT Myelogram 05/04/2024 IMPRESSION: 1. Prior L2-L4 posterior fusion with loosening of the L2 and L4 screws and no definite solid arthrodesis. 2. Unchanged moderate spinal stenosis at L4-5. 3. Unchanged mild-to-moderate right neural foraminal stenosis at L2-3. 4.  Aortic  Atherosclerosis (ICD10-I70.0).     Electronically Signed   By: Dasie Hamburg M.D.   On: 05/04/2024 13:05  I have personally reviewed the images and agree with the above interpretation.  Assessment and Plan: Mary Mullins is a pleasant 78 y.o. 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  She has no convincing fusion mass.  She has tried and failed conservative management.  At this point, no further conservative management is indicated.  Have recommended surgical intervention.  I have recommended L2-5 posterior spinal fusion with L4-5 decompression.      Sahira Cataldi K. Clois MD, [facility]

## 2024-08-24 NOTE — Transfer of Care (Signed)
 Immediate Anesthesia Transfer of Care Note  Patient: Mary Mullins  Procedure(s) Performed: POSTERIOR LUMBAR FUSION 3 LEVEL LUMBAR LAMINECTOMY/DECOMPRESSION MICRODISCECTOMY 1 LEVEL APPLICATION OF INTRAOPERATIVE CT SCAN  Patient Location: PACU  Anesthesia Type:General  Level of Consciousness: awake and drowsy  Airway & Oxygen Therapy: Patient Spontanous Breathing and Patient connected to face mask oxygen  Post-op Assessment: Report given to RN and Post -op Vital signs reviewed and stable  Post vital signs: Reviewed and stable  Last Vitals:  Vitals Value Taken Time  BP 126/77 1409  Temp    Pulse 77 08/24/24 14:09  Resp 20 08/24/24 14:09  SpO2 100 % 08/24/24 14:09  Vitals shown include unfiled device data.  Last Pain:  Vitals:   08/24/24 0748  TempSrc: Temporal  PainSc: 3          Complications: No notable events documented.

## 2024-08-24 NOTE — Anesthesia Procedure Notes (Signed)
 Arterial Line Insertion Start/End10/31/2025 9:44 AM, 08/24/2024 9:47 AM Performed by: Trudy Rankin LABOR, CRNA, CRNA  Patient location: OR. Preanesthetic checklist: patient identified, IV checked, site marked, risks and benefits discussed, surgical consent, monitors and equipment checked, pre-op evaluation, timeout performed and anesthesia consent radial was placed Catheter size: 20 G Hand hygiene performed  Allen's test indicative of satisfactory collateral circulation Attempts: 1 Following insertion, dressing applied. Post procedure assessment: normal and unchanged  Patient tolerated the procedure well with no immediate complications.

## 2024-08-25 DIAGNOSIS — M4316 Spondylolisthesis, lumbar region: Secondary | ICD-10-CM | POA: Diagnosis not present

## 2024-08-25 LAB — CBC
HCT: 33.1 % — ABNORMAL LOW (ref 36.0–46.0)
Hemoglobin: 11 g/dL — ABNORMAL LOW (ref 12.0–15.0)
MCH: 30.2 pg (ref 26.0–34.0)
MCHC: 33.2 g/dL (ref 30.0–36.0)
MCV: 90.9 fL (ref 80.0–100.0)
Platelets: 225 K/uL (ref 150–400)
RBC: 3.64 MIL/uL — ABNORMAL LOW (ref 3.87–5.11)
RDW: 12.3 % (ref 11.5–15.5)
WBC: 13.3 K/uL — ABNORMAL HIGH (ref 4.0–10.5)
nRBC: 0 % (ref 0.0–0.2)

## 2024-08-25 LAB — BASIC METABOLIC PANEL WITH GFR
Anion gap: 11 (ref 5–15)
BUN: 15 mg/dL (ref 8–23)
CO2: 25 mmol/L (ref 22–32)
Calcium: 8.5 mg/dL — ABNORMAL LOW (ref 8.9–10.3)
Chloride: 103 mmol/L (ref 98–111)
Creatinine, Ser: 0.93 mg/dL (ref 0.44–1.00)
GFR, Estimated: >60 mL/min (ref >60)
Glucose, Bld: 107 mg/dL — ABNORMAL HIGH (ref 70–99)
Potassium: 4.4 mmol/L (ref 3.5–5.1)
Sodium: 139 mmol/L (ref 135–145)

## 2024-08-25 LAB — CREATININE, SERUM
Creatinine, Ser: 0.93 mg/dL (ref 0.44–1.00)
GFR, Estimated: >60 mL/min (ref >60)

## 2024-08-25 MED ORDER — CELECOXIB 200 MG PO CAPS
200.0000 mg | ORAL_CAPSULE | Freq: Two times a day (BID) | ORAL | Status: DC
Start: 2024-08-26 — End: 2024-08-29
  Administered 2024-08-26 – 2024-08-29 (×7): 200 mg via ORAL
  Filled 2024-08-25 (×7): qty 1

## 2024-08-25 NOTE — Evaluation (Signed)
 Occupational Therapy Evaluation Patient Details Name: Mary Mullins MRN: [id number] DOB: [birth year] Today's Date: 08/25/2024   History of Present Illness   Mary Mullins is an 78 y/o F s/p elective L2-5 posterolateral arthrodesis and posterior segmental instrumentation, L4/5 decompression on 08/24/24. PMH significant for aorthic atherosclerosis, L2 compression fx, HTN, pre-DM, PE, pulmonary nodule, RBBB.     Clinical Impressions Mary Mullins presents this date with generalized weakness, limited endurance, and pain. Prior to admission, she lives with her husband in a level-entry home, has been largely IND in BADL, ambulating without AD, no falls hx. Husband assists with IADLs, given Mary Mullins's limited standing tolerance, 2/2 increased back pain in standing. Mary Mullins is able to perform transfers, toileting, bed mobility, ambulation w/ RW for ambulation, all with SUPV for safety and w/ fair standing balance. She endorses 5/10 pain at surgical site in sitting, shooting up to 8/10 pain with movement/standing. Mary Mullins is able to recall and describe 3/3 precautions and could complete mobility tasks while adhering to these. Mary Mullins would benefit from ongoing OT while hospitalized. She declines HHOT, stating she would not like anyone coming by the house and that her husband can provide any assistance she might need during her recovery period.     If plan is discharge home, recommend the following:   A little help with walking and/or transfers;A little help with bathing/dressing/bathroom     Functional Status Assessment   Patient has had a recent decline in their functional status and demonstrates the ability to make significant improvements in function in a reasonable and predictable amount of time.     Equipment Recommendations   None recommended by OT     Recommendations for Other Services         Precautions/Restrictions   Precautions Precautions: Back;Fall Required Braces or Orthoses: Spinal  Brace Spinal Brace: Lumbar corset;Applied in sitting position Restrictions Weight Bearing Restrictions Per Provider Order: No     Mobility Bed Mobility Overal bed mobility: Needs Assistance Bed Mobility: Sit to Supine       Sit to supine: Supervision, Used rails   General bed mobility comments: extra time and effort    Transfers Overall transfer level: Needs assistance Equipment used: Rolling walker (2 wheels) Transfers: Sit to/from Stand, Bed to chair/wheelchair/BSC Sit to Stand: Supervision     Step pivot transfers: Supervision            Balance Overall balance assessment: Needs assistance Sitting-balance support: Feet supported Sitting balance-Leahy Scale: Good     Standing balance support: Bilateral upper extremity supported, Reliant on assistive device for balance, During functional activity Standing balance-Leahy Scale: Fair                             ADL either performed or assessed with clinical judgement   ADL Overall ADL's : Needs assistance/impaired                         Toilet Transfer: Supervision/safety;BSC/3in1;Rolling walker (2 wheels)   Toileting- Clothing Manipulation and Hygiene: Supervision/safety;Sit to/from stand               Vision         Perception         Praxis         Pertinent Vitals/Pain Pain Assessment Pain Assessment: 0-10 Faces Pain Scale: Hurts whole lot Pain Location: low back Pain Descriptors / Indicators: Operative site guarding, Sore, Grimacing Pain  Intervention(s): Repositioned, Monitored during session     Extremity/Trunk Assessment Upper Extremity Assessment Upper Extremity Assessment: Overall WFL for tasks assessed   Lower Extremity Assessment Lower Extremity Assessment: Overall WFL for tasks assessed   Cervical / Trunk Assessment Cervical / Trunk Assessment: Back Surgery   Communication Communication Communication: No apparent difficulties Factors Affecting  Communication: Hearing impaired   Cognition Arousal: Alert Behavior During Therapy: WFL for tasks assessed/performed                                 Following commands: Intact       Cueing  General Comments      wound vac over lower back intact throughtout session   Exercises Other Exercises Other Exercises: Educ re: precautions, DC recs,   Shoulder Instructions      Home Living Family/patient expects to be discharged to:: Private residence Living Arrangements: Spouse/significant other Available Help at Discharge: Family;Available 24 hours/day Type of Home: House Home Access: Level entry     Home Layout: Multi-level;Able to live on main level with bedroom/bathroom Alternate Level Stairs-Number of Steps: 12 Alternate Level Stairs-Rails: Can reach both;Left;Right Bathroom Shower/Tub: Arts Development Officer Toilet: Handicapped height     Home Equipment: Agricultural Consultant (2 wheels);Shower seat;Grab bars - tub/shower          Prior Functioning/Environment Prior Level of Function : Independent/Modified Independent             Mobility Comments: Mary Mullins reports being IND with mobility, no previous AD use, mobility limited by back pain ADLs Comments: Husband helps with donning socks, Mary Mullins otherwise IND with BADLs; husband performs shopping, cooking, cleaning, given Mary Mullins's limited standing endurance 2/2 back pain    OT Problem List: Decreased range of motion;Decreased activity tolerance;Impaired balance (sitting and/or standing);Pain;Decreased knowledge of use of DME or AE   OT Treatment/Interventions: Self-care/ADL training;Therapeutic exercise;Patient/family education;Balance training;Therapeutic activities;DME and/or AE instruction      OT Goals(Current goals can be found in the care plan section)   Acute Rehab OT Goals Patient Stated Goal: to have less back pain and never have to have surgery again OT Goal Formulation: With patient Time For Goal  Achievement: 09/08/24 Potential to Achieve Goals: Good ADL Goals Mary Mullins Will Perform Grooming: with modified independence;standing Mary Mullins Will Transfer to Toilet: with modified independence Mary Mullins Will Perform Toileting - Clothing Manipulation and hygiene: with modified independence   OT Frequency:  Min 2X/week    Co-evaluation              AM-PAC OT 6 Clicks Daily Activity     Outcome Measure Help from another person eating meals?: None Help from another person taking care of personal grooming?: A Little Help from another person toileting, which includes using toliet, bedpan, or urinal?: A Little Help from another person bathing (including washing, rinsing, drying)?: A Lot Help from another person to put on and taking off regular upper body clothing?: A Little Help from another person to put on and taking off regular lower body clothing?: A Lot 6 Click Score: 17   End of Session Equipment Utilized During Treatment: Rolling walker (2 wheels)  Activity Tolerance: Patient tolerated treatment well Patient left: in bed;with bed alarm set;with call bell/phone within reach  OT Visit Diagnosis: Unsteadiness on feet (R26.81);Muscle weakness (generalized) (M62.81);Pain                Time: 8871-8851 OT Time Calculation (min): 20  min Charges:  OT General Charges $OT Visit: 1 Visit OT Evaluation $OT Eval Moderate Complexity: 1 Mod OT Treatments $Self Care/Home Management : 8-22 mins Suzen Hock, PhD, MS, OTR/L 08/25/24, 11:57 AM

## 2024-08-25 NOTE — Progress Notes (Signed)
 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  POD1: Having expected pain  Physical Exam: Vitals:   08/25/24 0509 08/25/24 0801  BP: 129/75 (!) 110/59  Pulse: 86 71  Resp: 18 16  Temp: 99.1 F (37.3 C) 98.1 F (36.7 C)  SpO2: 97% 98%    AA Ox3 CNI  Strength:5/5 throughout BLE  Dressing c/d/I  Drain 1040 overnight?  Data:  Other tests/results: CBC reviewed  Assessment/Plan:  Mary Mullins is doing well after open lumbar surgery.  - mobilize - pain control - DVT prophylaxis - PTOT - Monitor drain output and labs   Reeves Daisy MD, [facility] Department of Neurosurgery

## 2024-08-25 NOTE — Progress Notes (Signed)
 Physical Therapy Treatment Patient Details Name: Mary Mullins MRN: [id number] DOB: [birth year] Today's Date: 08/25/2024   History of Present Illness Pt is an 78 y/o F s/p elective L2-5 posterolateral arthrodesis and posterior segmental instrumentation, L4/5 decompression on 08/24/24. PMH significant for aorthic atherosclerosis, L2 compression fx, HTN, pre-DM, PE, pulmonary nodule, RBBB.    PT Comments  Pt seen for PM PT session this date, agreeable to participate with encouragement. Pt was received in bed, able to achieve sitting EOB with SBA and min VC for log roll sequencing. Pt performed STS with minA, required anterior rocking to gain momentum for transfer from lowest bed height. Pt requires frequent cues and reminders to reach back for sitting surface prior to sitting to control descent. Pt amb ~28ft with CGA, maintained trunk flexed throughout despite cues for RW positioning to promote upright posture, no LOB or buckling throughout, self-limited distance. Pt returned to supine with modA to assist BLE into bed, max VC for log roll sequencing to return to bed. Pt was left semi-supine in bed at end of session, all needs in reach. The patient would benefit from further skilled PT intervention to continue to progress towards goals.      If plan is discharge home, recommend the following: A little help with walking and/or transfers;A lot of help with bathing/dressing/bathroom;Assistance with cooking/housework;Assist for transportation;Help with stairs or ramp for entrance   Can travel by private vehicle        Equipment Recommendations  None recommended by PT    Recommendations for Other Services       Precautions / Restrictions Precautions Precautions: Back;Fall Precaution Booklet Issued: No Recall of Precautions/Restrictions: Impaired Precaution/Restrictions Comments: pt recalled 2/3 back precautions at start of session Required Braces or Orthoses: Spinal Brace Spinal Brace:  Lumbar corset;Applied in sitting position Restrictions Weight Bearing Restrictions Per Provider Order: No     Mobility  Bed Mobility Overal bed mobility: Needs Assistance Bed Mobility: Rolling, Sidelying to Sit, Sit to Sidelying Rolling: Supervision, Used rails Sidelying to sit: Supervision, Used rails Supine to sit: Supervision, Used rails   Sit to sidelying: Mod assist, Used rails General bed mobility comments: SBA to achieve sitting with VC for log roll sequencing. ModA to assist BLE into bed, max VC for log roll sequencing when returning to bed.    Transfers Overall transfer level: Needs assistance Equipment used: Rolling walker (2 wheels) Transfers: Sit to/from Stand Sit to Stand: Min assist           General transfer comment: minA STS from lowest bed height. Pt needs frequent cues and reminders to reach back for sitting surface to control descent to sitting    Ambulation/Gait Ambulation/Gait assistance: Contact guard assist Gait Distance (Feet): 60 Feet Assistive device: Rolling walker (2 wheels) Gait Pattern/deviations: Step-through pattern, Wide base of support, Trunk flexed Gait velocity: decreased     General Gait Details: No LOB or buckling throughout. Attempted to have pt amb with upright trunk posture pt stated 'that's not gonna happen, maintained trunk flexed throughout. Min VC for RW positioning to allow pt to offload BLE during amb.   Stairs             Wheelchair Mobility     Tilt Bed    Modified Rankin (Stroke Patients Only)       Balance Overall balance assessment: Needs assistance Sitting-balance support: Feet supported Sitting balance-Leahy Scale: Good Sitting balance - Comments: steady static and dynamic sitting   Standing balance support: Bilateral upper  extremity supported, Reliant on assistive device for balance Standing balance-Leahy Scale: Fair Standing balance comment: heavy BUE support on RW                             Communication Communication Communication: No apparent difficulties Factors Affecting Communication: Hearing impaired  Cognition Arousal: Alert Behavior During Therapy: WFL for tasks assessed/performed   PT - Cognitive impairments: No apparent impairments                       PT - Cognition Comments: A&Ox4, pleasant and cooperative throughout Following commands: Intact      Cueing Cueing Techniques: Verbal cues, Visual cues  Exercises Other Exercises Other Exercises: Pt required modA to don/doff LSO in sitting    General Comments General comments (skin integrity, edema, etc.): wound vac over lower back intact throughout session, drain appeared nearly 1/2 full during session      Pertinent Vitals/Pain Pain Assessment Pain Assessment: Faces Faces Pain Scale: Hurts even more Pain Location: low back Pain Descriptors / Indicators: Grimacing, Operative site guarding, Sore Pain Intervention(s): Monitored during session, Repositioned    Home Living Family/patient expects to be discharged to:: Private residence Living Arrangements: Spouse/significant other Available Help at Discharge: Family;Available 24 hours/day Type of Home: House Home Access: Level entry     Alternate Level Stairs-Number of Steps: 12 Home Layout: Multi-level;Able to live on main level with bedroom/bathroom Home Equipment: Rolling Walker (2 wheels);Shower seat;Grab bars - tub/shower      Prior Function            PT Goals (current goals can now be found in the care plan section) Progress towards PT goals: Progressing toward goals    Frequency    BID      PT Plan      Co-evaluation              AM-PAC PT 6 Clicks Mobility   Outcome Measure  Help needed turning from your back to your side while in a flat bed without using bedrails?: A Little Help needed moving from lying on your back to sitting on the side of a flat bed without using bedrails?: A Little Help  needed moving to and from a bed to a chair (including a wheelchair)?: A Little Help needed standing up from a chair using your arms (e.g., wheelchair or bedside chair)?: A Little Help needed to walk in hospital room?: A Little Help needed climbing 3-5 steps with a railing? : A Lot 6 Click Score: 17    End of Session Equipment Utilized During Treatment: Gait belt;Back brace Activity Tolerance: Patient tolerated treatment well Patient left: in bed;with call bell/phone within reach;with bed alarm set Nurse Communication: Mobility status PT Visit Diagnosis: Other abnormalities of gait and mobility (R26.89);Difficulty in walking, not elsewhere classified (R26.2);Pain Pain - part of body:  (low back)     Time: 8641-8586 PT Time Calculation (min) (ACUTE ONLY): 15 min  Charges:    $Therapeutic Activity: 8-22 mins PT General Charges $$ ACUTE PT VISIT: 1 Visit                     Altie Savard, SPT

## 2024-08-25 NOTE — Plan of Care (Signed)
   Problem: Education: Goal: Knowledge of General Education information will improve Description Including pain rating scale, medication(s)/side effects and non-pharmacologic comfort measures Outcome: Progressing   Problem: Health Behavior/Discharge Planning: Goal: Ability to manage health-related needs will improve Outcome: Progressing

## 2024-08-25 NOTE — Care Management Obs Status (Signed)
 MEDICARE OBSERVATION STATUS NOTIFICATION   Patient Details  Name: Mary Mullins MRN: [id number] Date of Birth: June 25, 1946   Medicare Observation Status Notification Given:  Yes    Rojelio SHAUNNA Rattler 08/25/2024, 1:10 PM

## 2024-08-25 NOTE — Evaluation (Signed)
 Physical Therapy Evaluation Patient Details Name: Mary Mullins MRN: [id number] DOB: 1945/12/16 Today's Date: 08/25/2024  History of Present Illness  Pt is an 78 y/o F s/p elective L2-5 posterolateral arthrodesis and posterior segmental instrumentation, L4/5 decompression on 08/24/24. PMH significant for aorthic atherosclerosis, L2 compression fx, HTN, pre-DM, PE, pulmonary nodule, RBBB.   Clinical Impression  Pt A&Ox4, pleasant and agreeable to participate in PT evaluation. At baseline, pt reports being IND with mobility/ADLs, ambulation tolerance limited due to back pain, denies hx of falls. Pt was received in bed, able to achieve sitting EOB with SBA and verbal/tactile cues for log roll sequencing. Pt performed STS from elevated EOB with CGA, VC for hand placement on RW and armrests to control descent to sitting. Pt amb ~71ft around room with RW and no LOB or buckling, pt able to perform tight turns well while adhering to twisting precautions. Vitals assessed after ambulation due to pt reporting dizziness- vitals WFL and symptoms improved with prolonged seated rest. Pt was left sitting in recliner at end of session with all needs in reach. Pt would benefit from skilled PT intervention to address listed deficits (see PT Problem List) and allow for safe return to PLOF.        If plan is discharge home, recommend the following: A little help with walking and/or transfers;A lot of help with bathing/dressing/bathroom;Assistance with cooking/housework;Assist for transportation;Help with stairs or ramp for entrance   Can travel by private vehicle        Equipment Recommendations None recommended by PT  Recommendations for Other Services       Functional Status Assessment Patient has had a recent decline in their functional status and demonstrates the ability to make significant improvements in function in a reasonable and predictable amount of time.     Precautions / Restrictions  Precautions Precautions: Back;Fall Precaution Booklet Issued: No Recall of Precautions/Restrictions: Impaired Precaution/Restrictions Comments: recalled 2/3 back precautions at start of session. LSO in room Required Braces or Orthoses: Spinal Brace Spinal Brace: Lumbar corset;Applied in sitting position (LSO) Restrictions Weight Bearing Restrictions Per Provider Order: No      Mobility  Bed Mobility Overal bed mobility: Needs Assistance Bed Mobility: Supine to Sit     Supine to sit: Supervision, Used rails     General bed mobility comments: SBA with use of bed rails, verbal/tactile cues for log roll sequencing.    Transfers Overall transfer level: Needs assistance Equipment used: Rolling walker (2 wheels) Transfers: Sit to/from Stand Sit to Stand: Contact guard assist, From elevated surface           General transfer comment: STS from elevated EOB with CGA, VC for hand placement on RW/arm rests.    Ambulation/Gait Ambulation/Gait assistance: Contact guard assist Gait Distance (Feet): 40 Feet Assistive device: Rolling walker (2 wheels) Gait Pattern/deviations: Step-through pattern, Wide base of support       General Gait Details: Pt able to ambulate around room x2 laps with no LOB or buckling. Step-through pattern throughout with wide BOS, VC to maintain BOS within RW frame  Stairs            Wheelchair Mobility     Tilt Bed    Modified Rankin (Stroke Patients Only)       Balance Overall balance assessment: Needs assistance Sitting-balance support: Feet supported Sitting balance-Leahy Scale: Good Sitting balance - Comments: steady static and dynamic sitting   Standing balance support: Bilateral upper extremity supported Standing balance-Leahy Scale: Fair Standing balance  comment: external support required                             Pertinent Vitals/Pain Pain Assessment Pain Assessment: Faces Faces Pain Scale: Hurts little  more Pain Location: low back Pain Descriptors / Indicators: Operative site guarding, Sore Pain Intervention(s): Monitored during session, Repositioned    Home Living Family/patient expects to be discharged to:: Private residence Living Arrangements: Spouse/significant other Available Help at Discharge: Family;Available 24 hours/day Type of Home: House Home Access: Level entry     Alternate Level Stairs-Number of Steps: 12 Home Layout: Multi-level;Able to live on main level with bedroom/bathroom Home Equipment: Rolling Walker (2 wheels);Shower seat;Grab bars - tub/shower      Prior Function Prior Level of Function : Independent/Modified Independent             Mobility Comments: Pt reports being IND with mobility, no previous AD use, mobility limited by back pain ADLs Comments: Husband helps with donning socks, pt otherwise IND with BADLs; husband performs shopping, cooking, cleaning, given pt's limited standing endurance 2/2 back pain     Extremity/Trunk Assessment   Upper Extremity Assessment Upper Extremity Assessment: Overall WFL for tasks assessed    Lower Extremity Assessment Lower Extremity Assessment: Overall WFL for tasks assessed    Cervical / Trunk Assessment Cervical / Trunk Assessment: Back Surgery  Communication   Communication Communication: No apparent difficulties Factors Affecting Communication: Hearing impaired    Cognition Arousal: Alert Behavior During Therapy: WFL for tasks assessed/performed   PT - Cognitive impairments: No apparent impairments                       PT - Cognition Comments: A&Ox4, pleasant and cooperative throughout Following commands: Intact       Cueing Cueing Techniques: Verbal cues, Tactile cues     General Comments General comments (skin integrity, edema, etc.): wound vac over lower back intact throughtout session    Exercises Other Exercises Other Exercises: Vitals assessed after completing amb due  to pt reporting dizziness: BP 109/61 (77) HR 96bpm SpO2 96% on RA. Symptoms improved with prolonged seated rest   Assessment/Plan    PT Assessment Patient needs continued PT services  PT Problem List Decreased strength;Decreased activity tolerance;Decreased balance;Decreased mobility;Pain       PT Treatment Interventions DME instruction;Gait training;Stair training;Functional mobility training;Therapeutic activities;Therapeutic exercise;Balance training;Neuromuscular re-education;Patient/family education    PT Goals (Current goals can be found in the Care Plan section)  Acute Rehab PT Goals Patient Stated Goal: to be able to walk her dog PT Goal Formulation: With patient Time For Goal Achievement: 09/08/24 Potential to Achieve Goals: Good    Frequency BID     Co-evaluation               AM-PAC PT 6 Clicks Mobility  Outcome Measure Help needed turning from your back to your side while in a flat bed without using bedrails?: A Little Help needed moving from lying on your back to sitting on the side of a flat bed without using bedrails?: A Little Help needed moving to and from a bed to a chair (including a wheelchair)?: A Little Help needed standing up from a chair using your arms (e.g., wheelchair or bedside chair)?: A Little Help needed to walk in hospital room?: A Little Help needed climbing 3-5 steps with a railing? : A Lot 6 Click Score: 17    End of  Session Equipment Utilized During Treatment: Gait belt;Back brace Activity Tolerance: Patient tolerated treatment well Patient left: in chair;with call bell/phone within reach;with chair alarm set (Pt reported SCD box not working properly- RN aware) Nurse Communication: Mobility status PT Visit Diagnosis: Other abnormalities of gait and mobility (R26.89);Difficulty in walking, not elsewhere classified (R26.2);Pain Pain - part of body:  (low back)    Time: 9040-8967 PT Time Calculation (min) (ACUTE ONLY): 33  min   Charges:   PT Evaluation $PT Eval Moderate Complexity: 1 Mod PT Treatments $Therapeutic Activity: 8-22 mins PT General Charges $$ ACUTE PT VISIT: 1 Visit         Janell Axe, SPT

## 2024-08-26 DIAGNOSIS — M4316 Spondylolisthesis, lumbar region: Secondary | ICD-10-CM | POA: Diagnosis not present

## 2024-08-26 MED ORDER — FLEET ENEMA RE ENEM
1.0000 | ENEMA | Freq: Every day | RECTAL | Status: DC | PRN
  Administered 2024-08-26: 1 via RECTAL

## 2024-08-26 MED ORDER — MAGNESIUM HYDROXIDE 400 MG/5ML PO SUSP
15.0000 mL | Freq: Every day | ORAL | Status: DC | PRN
  Administered 2024-08-26: 15 mL via ORAL

## 2024-08-26 MED ORDER — SENNOSIDES-DOCUSATE SODIUM 8.6-50 MG PO TABS
2.0000 | ORAL_TABLET | Freq: Two times a day (BID) | ORAL | Status: DC
  Administered 2024-08-26 – 2024-08-29 (×3): 2 via ORAL
  Filled 2024-08-26 (×4): qty 2

## 2024-08-26 NOTE — Progress Notes (Signed)
 Physical Therapy Treatment Patient Details Name: Mary Mullins MRN: [id number] DOB: [birth year] Today's Date: 08/26/2024   History of Present Illness Pt is an 78 y/o F s/p elective L2-5 posterolateral arthrodesis and posterior segmental instrumentation, L4/5 decompression on 08/24/24. PMH significant for aorthic atherosclerosis, L2 compression fx, HTN, pre-DM, PE, pulmonary nodule, RBBB.    PT Comments  Pt seen for PM PT session this date, reported improved nausea and dizziness from AM session. Pt was met sitting on BSC in room and expressed she had a BM, maxA for pericare in standing. Pt able to stand-pivot transfer to bed with CGA, utilizing UE support on bed to guide self to sitting. STS from elevated EOB with CGA, though effortful for pt to complete transfer due to bilateral hip pain. Pt amb ~34ft with RW and CGA, no LOB throughout, maintained trunk flexed due to pain when attempting full upright posture. Pt noted to utilize heavy BUE support on RW during ambulation, distance limited by pt citing arm fatigue. Pt returned to supine with modA BLE assist, poor adherence to log roll technique when returning to bed despite thorough education on sequencing and max multimodal cues during transfer. Pt was left semi-reclined in bed at end of session with all needs in reach, RN notified of pt requesting pain meds. The patient would benefit from further skilled PT intervention to continue to progress towards goals.     If plan is discharge home, recommend the following: A little help with walking and/or transfers;A lot of help with bathing/dressing/bathroom;Assistance with cooking/housework;Assist for transportation;Help with stairs or ramp for entrance   Can travel by private vehicle        Equipment Recommendations  None recommended by PT    Recommendations for Other Services       Precautions / Restrictions Precautions Precautions: Back;Fall Precaution Booklet Issued: No Recall of  Precautions/Restrictions: Impaired Required Braces or Orthoses: Spinal Brace Spinal Brace: Lumbar corset;Applied in sitting position Restrictions Weight Bearing Restrictions Per Provider Order: No     Mobility  Bed Mobility Overal bed mobility: Needs Assistance Bed Mobility: Rolling, Sit to Sidelying Rolling: Supervision, Used rails Sidelying to sit: Supervision, Used rails     Sit to sidelying: Mod assist, Used rails General bed mobility comments: ModA BLE assist to enter bed. Poor adherence to log roll returning to bed despite thorough explanation and max multimodal cues    Transfers Overall transfer level: Needs assistance Equipment used: Rolling walker (2 wheels), None Transfers: Sit to/from Stand, Bed to chair/wheelchair/BSC Sit to Stand: Contact guard assist, From elevated surface Stand pivot transfers: Contact guard assist         General transfer comment: Stand-pivot transfer BSC to bed with CGA. Additional STS from elevated EOB with RW and CGA, effortful to complete full STS    Ambulation/Gait Ambulation/Gait assistance: Contact guard assist Gait Distance (Feet): 60 Feet Assistive device: Rolling walker (2 wheels) Gait Pattern/deviations: Step-through pattern, Wide base of support, Trunk flexed Gait velocity: decreased     General Gait Details: No LOB or buckling throughout, maintained trunk flexed despite cues for upright posture due to back pain when fully upright. VC for RW positioning, heavy BUE support on RW throughout   Stairs             Wheelchair Mobility     Tilt Bed    Modified Rankin (Stroke Patients Only)       Balance Overall balance assessment: Needs assistance Sitting-balance support: Feet supported Sitting balance-Leahy Scale: Good Sitting balance -  Comments: steady static and dynamic sitting   Standing balance support: Bilateral upper extremity supported, No upper extremity supported Standing balance-Leahy Scale:  Fair Standing balance comment: pt able to stand for pericare with no LOB                            Communication Communication Communication: No apparent difficulties  Cognition Arousal: Alert Behavior During Therapy: WFL for tasks assessed/performed   PT - Cognitive impairments: No apparent impairments                       PT - Cognition Comments: A&Ox4, pleasant and cooperative throughout Following commands: Intact      Cueing Cueing Techniques: Verbal cues, Visual cues  Exercises Other Exercises Other Exercises: minA to don/doff LSO in sitting, maxA for pericare in standing Other Exercises: minA to don/doff LSO in sitting    General Comments General comments (skin integrity, edema, etc.): wound vac and drain over low back intact throughout session      Pertinent Vitals/Pain Pain Assessment Pain Assessment: Faces Faces Pain Scale: Hurts little more Pain Location: low back Pain Descriptors / Indicators: Grimacing, Operative site guarding, Sore Pain Intervention(s): Monitored during session, Repositioned, Patient requesting pain meds-RN notified    Home Living                          Prior Function            PT Goals (current goals can now be found in the care plan section) Progress towards PT goals: Progressing toward goals    Frequency    BID      PT Plan      Co-evaluation              AM-PAC PT 6 Clicks Mobility   Outcome Measure  Help needed turning from your back to your side while in a flat bed without using bedrails?: A Little Help needed moving from lying on your back to sitting on the side of a flat bed without using bedrails?: A Little Help needed moving to and from a bed to a chair (including a wheelchair)?: A Little Help needed standing up from a chair using your arms (e.g., wheelchair or bedside chair)?: A Little Help needed to walk in hospital room?: A Little Help needed climbing 3-5 steps with a  railing? : A Lot 6 Click Score: 17    End of Session Equipment Utilized During Treatment: Gait belt;Back brace Activity Tolerance: Patient tolerated treatment well Patient left: in bed;with call bell/phone within reach;with bed alarm set Nurse Communication: Mobility status;Patient requests pain meds PT Visit Diagnosis: Other abnormalities of gait and mobility (R26.89);Difficulty in walking, not elsewhere classified (R26.2);Pain Pain - part of body:  (low back)     Time: 8494-8473 PT Time Calculation (min) (ACUTE ONLY): 21 min  Charges:    $Therapeutic Activity: 8-22 mins PT General Charges $$ ACUTE PT VISIT: 1 Visit                     Tanicia Wolaver, SPT

## 2024-08-26 NOTE — TOC Initial Note (Signed)
 Transition of Care [facility]) - Initial/Assessment Note    Patient Details  Name: Mary Mullins MRN: [id number] Date of Birth: [birth year]  Transition of Care [facility]) CM/SW Contact:    Victory Jackquline GORMAN, RN Phone Number: 08/26/2024, 3:05 PM  Clinical Narrative:     RNCM, spoke with the patient at the bedside. I introduced myself, my role, and explained that discharge planning recommendations would be discussed. PT recommended Home with Home Health/PT. Patient declined HH/PT, states that she doesn't think that she needs it. PT has had 3 back surgeries in the past and states that she has her husband and 2 daughters that will help her. She has DME at home [employer] and [facility]). RNCM will continue to follow for discharge planning/care coordination and update as applicable.    Expected Discharge Plan: Home w Home Health Services Barriers to Discharge: Continued Medical Work up   Patient Goals and CMS Choice            Expected Discharge Plan and Services       Living arrangements for the past 2 months: Single Family Home                                      Prior Living Arrangements/Services Living arrangements for the past 2 months: Single Family Home Lives with:: Self, Spouse Patient language and need for interpreter reviewed:: Yes Do you feel safe going back to the place where you live?: Yes      Need for Family Participation in Patient Care: Yes (Comment) Care giver support system in place?: Yes (comment) Current home services: DME Criminal Activity/Legal Involvement Pertinent to Current Situation/Hospitalization: No - Comment as needed  Activities of Daily Living      Permission Sought/Granted                  Emotional Assessment Appearance:: Appears stated age, Well-Groomed     Orientation: : Oriented to Self, Oriented to Place, Oriented to  Time, Oriented to Situation Alcohol / Substance Use: Not Applicable Psych Involvement: No  (comment)  Admission diagnosis:  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 [M96.0] Lumbar adjacent segment disease with spondylolisthesis [M51.369, M43.16] Spinal stenosis of lumbar region with neurogenic claudication [M48.062] S/P lumbar fusion [Z98.1] Patient Active Problem List   Diagnosis Date Noted   S/P lumbar fusion 08/24/2024   Pseudarthrosis following spinal fusion 08/24/2024   Lumbar adjacent segment disease with spondylolisthesis 08/24/2024   Chronic idiopathic constipation 07/20/2024   Chronic thoracic back pain 07/20/2024   Compression fracture of L2 (HCC) 07/20/2024   Degeneration of lumbar intervertebral disc 07/20/2024   Greater trochanteric pain syndrome 07/20/2024   Lumbar pseudoarthrosis 07/20/2024   Lumbar radiculopathy 07/20/2024   Arthropathy of lumbar facet joint 07/20/2024   Thoracic facet syndrome 07/20/2024   Pulmonary embolism (HCC) 07/11/2023   Hyponatremia 07/11/2023   COVID-19 virus infection 07/11/2023   Hypertension    Unilateral primary osteoarthritis, left hip 05/17/2023   Lumbar spinal stenosis 10/19/2022   Lumbar stenosis with neurogenic claudication 10/13/2021   Dyslipidemia 07/09/2021   PCP:  Macarthur Elouise SQUIBB, DO Pharmacy:   [facility] [phone] - [place], [place] - [address] [place] [address] Phone: [phone] Fax: [phone]     Social Drivers of Health (SDOH) Social History: SDOH Screenings   Food Insecurity: No Food Insecurity (08/25/2024)  Housing: Low Risk  (08/25/2024)  Transportation Needs: No Transportation Needs (08/25/2024)  Utilities: Not At Risk (08/25/2024)  Financial Resource Strain: Low Risk  (07/07/2022)   [facility] from [facility] prior to 12/25/2022., [facility]  Physical Activity: Inactive (07/07/2022)   Received from [facility] visits prior to 12/25/2022., [facility]  Social Connections: Unknown (08/25/2024)   Stress: No Stress Concern Present (07/07/2022)   Received from [facility] visits prior to 12/25/2022., [facility]  Tobacco Use: Low Risk  (08/24/2024)   SDOH Interventions:     Readmission Risk Interventions    07/13/2023    9:08 AM  Readmission Risk Prevention Plan  Post Dischage Appt Complete  Medication Screening Complete  Transportation Screening Complete

## 2024-08-26 NOTE — Progress Notes (Signed)
 Physical Therapy Treatment Patient Details Name: Mary Mullins MRN: [id number] DOB: Mar 28, 1946 Today's Date: 08/26/2024   History of Present Illness Pt is an 78 y/o F s/p elective L2-5 posterolateral arthrodesis and posterior segmental instrumentation, L4/5 decompression on 08/24/24. PMH significant for aorthic atherosclerosis, L2 compression fx, HTN, pre-DM, PE, pulmonary nodule, RBBB.    PT Comments  Pt alert, agreeable to participate in PT session. Pt was received in bed, SBA to exit bed with VC for log roll sequencing. Pt reported dizziness when sitting at EOB, orthostatic vitals assessed during session, see below for specific readings- RN/MD notified. MinA required for initial STS from elevated EOB, additional STS with CGA. Max verbal and tactile cues utilized for pt to reach back for bed rail prior to sitting to control descent. Pt able to take several side-steps toward [facility] with CGA, additional ambulation deferred at this time due to pt's symptoms of dizziness and nausea upon standing. ModA to assist with returning BLE into bed, poor adherence to log roll with returning to supine despite max multimodal cues. Pt was left semi-reclined in bed at end of session with all needs in reach. The patient would benefit from further skilled PT intervention to continue to progress towards goals.     If plan is discharge home, recommend the following: A little help with walking and/or transfers;A lot of help with bathing/dressing/bathroom;Assistance with cooking/housework;Assist for transportation;Help with stairs or ramp for entrance   Can travel by private vehicle        Equipment Recommendations  None recommended by PT    Recommendations for Other Services       Precautions / Restrictions Precautions Precautions: Back;Fall Precaution Booklet Issued: No Recall of Precautions/Restrictions: Impaired Required Braces or Orthoses: Spinal Brace Spinal Brace: Lumbar corset;Applied in sitting  position Restrictions Weight Bearing Restrictions Per Provider Order: No     Mobility  Bed Mobility Overal bed mobility: Needs Assistance Bed Mobility: Rolling, Sidelying to Sit, Sit to Sidelying Rolling: Supervision, Used rails Sidelying to sit: Supervision, Used rails     Sit to sidelying: Mod assist, Used rails General bed mobility comments: SBA to exit bed with VC for log roll sequencing. ModA to return BLE to bed    Transfers Overall transfer level: Needs assistance Equipment used: Rolling walker (2 wheels) Transfers: Sit to/from Stand Sit to Stand: Min assist, Contact guard assist, From elevated surface           General transfer comment: 2 STS from elevated EOB, initial STS with minA, second with CGA. Verbal and tactile cues to reach back for bed rail prior to sitting    Ambulation/Gait Ambulation/Gait assistance: Contact guard assist Gait Distance (Feet): 2 Feet Assistive device: Rolling walker (2 wheels)         General Gait Details: Pt able to take ~3 side-steps toward HOB, additional amb deferred due to pt reporting dizziness in standing   Stairs             Wheelchair Mobility     Tilt Bed    Modified Rankin (Stroke Patients Only)       Balance Overall balance assessment: Needs assistance Sitting-balance support: Feet supported Sitting balance-Leahy Scale: Fair Sitting balance - Comments: one instance of posterior near LOB in sitting required modA to correct- suspect due to pt's dizziness symptoms   Standing balance support: Bilateral upper extremity supported, Reliant on assistive device for balance Standing balance-Leahy Scale: Fair Standing balance comment: increased postural sway this session, suspect due to dizziness  Communication Communication Communication: No apparent difficulties  Cognition Arousal: Alert Behavior During Therapy: WFL for tasks assessed/performed   PT - Cognitive  impairments: No apparent impairments                       PT - Cognition Comments: A&Ox4, pleasant and cooperative throughout Following commands: Intact      Cueing Cueing Techniques: Verbal cues, Visual cues  Exercises Other Exercises Other Exercises: Orthostatic vitals assessed during session with pt reporting dizziness and nausea with position changes: 134/64 (85) sitting EOB at rest, 142/128 initial standing, 96/76 (85) on return to sitting, 123/64 (80) on return to supine. RN/MD informed Other Exercises: minA to don/doff LSO in sitting    General Comments General comments (skin integrity, edema, etc.): wound vac over low back intact throughout session      Pertinent Vitals/Pain Pain Assessment Pain Assessment: Faces Faces Pain Scale: Hurts little more Pain Location: low back Pain Descriptors / Indicators: Grimacing, Operative site guarding, Sore Pain Intervention(s): Monitored during session, Repositioned    Home Living                          Prior Function            PT Goals (current goals can now be found in the care plan section) Progress towards PT goals: Progressing toward goals    Frequency    BID      PT Plan      Co-evaluation              AM-PAC PT 6 Clicks Mobility   Outcome Measure  Help needed turning from your back to your side while in a flat bed without using bedrails?: A Little Help needed moving from lying on your back to sitting on the side of a flat bed without using bedrails?: A Little Help needed moving to and from a bed to a chair (including a wheelchair)?: A Little Help needed standing up from a chair using your arms (e.g., wheelchair or bedside chair)?: A Little Help needed to walk in hospital room?: A Little Help needed climbing 3-5 steps with a railing? : A Lot 6 Click Score: 17    End of Session Equipment Utilized During Treatment: Gait belt;Back brace Activity Tolerance: Other (comment)  (Limited due to dizziness) Patient left: in bed;with call bell/phone within reach;with bed alarm set;with family/visitor present Nurse Communication: Mobility status;Other (comment) (BP readings) PT Visit Diagnosis: Other abnormalities of gait and mobility (R26.89);Difficulty in walking, not elsewhere classified (R26.2);Pain Pain - part of body:  (low back)     Time: 9070-9047 PT Time Calculation (min) (ACUTE ONLY): 23 min  Charges:    $Therapeutic Activity: 23-37 mins PT General Charges $$ ACUTE PT VISIT: 1 Visit                     Janell Axe, SPT

## 2024-08-26 NOTE — Progress Notes (Signed)
 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  POD2: some dizziness today. Having constipation POD1: Having expected pain  Physical Exam: Vitals:   08/26/24 0522 08/26/24 0739  BP: 133/64 138/82  Pulse: 82 85  Resp: 18 18  Temp: 98.4 F (36.9 C) 98.3 F (36.8 C)  SpO2: 98% 96%    AA Ox3 CNI  Strength:5/5 throughout BLE  Dressing c/d/I  Drain 40 overnight? Unclear documentation  Data:  Other tests/results: CBC reviewed  Assessment/Plan:  Mary Mullins is doing well after open lumbar surgery.  - mobilize - pain control - DVT prophylaxis - PTOT - Monitor drain output - change bowel regimen   Reeves Daisy MD, [facility] Department of Neurosurgery

## 2024-08-27 ENCOUNTER — Encounter: Payer: Self-pay | Admitting: Radiology

## 2024-08-27 ENCOUNTER — Encounter: Payer: Self-pay | Admitting: Neurosurgery

## 2024-08-27 DIAGNOSIS — M4316 Spondylolisthesis, lumbar region: Secondary | ICD-10-CM | POA: Diagnosis not present

## 2024-08-27 MED ORDER — SODIUM CHLORIDE 0.9 % IV SOLN: INTRAVENOUS | Status: AC

## 2024-08-27 NOTE — Progress Notes (Signed)
 Occupational Therapy Treatment Patient Details Name: Mary Mullins MRN: [id number] DOB: May 28, 1946 Today's Date: 08/27/2024   History of present illness Pt is an 78 y/o F s/p elective L2-5 posterolateral arthrodesis and posterior segmental instrumentation, L4/5 decompression on 08/24/24. PMH significant for aorthic atherosclerosis, L2 compression fx, HTN, pre-DM, PE, pulmonary nodule, RBBB.   OT comments  Pt is supine in bed on arrival. Easily arousable and agreeable to OT session. She reports her typical hip/back pain. Pt performed bed mobility with supervision to Min A for BLE management to return to supine. She is aware of all back precautions and log roll technique. Max A to donn/doff LSO at EOB. She demo STS from EOB with CGA, increased time and 2 trials to successfully stand. No c/o dizziness with standing so progressed to ambulation ~120 ft using RW with CGA and good safety. She returned to room and demo toilet transfer with CGA/SBA and standing peri-care with Min A for thoroughness. She is progressing well and will cont to benefit from acute OT services.       If plan is discharge home, recommend the following:  A little help with walking and/or transfers;A little help with bathing/dressing/bathroom;Assistance with cooking/housework   Equipment Recommendations  BSC/3in1;Other (comment) (RW)    Recommendations for Other Services      Precautions / Restrictions Precautions Precautions: Back;Fall Precaution Booklet Issued: No Recall of Precautions/Restrictions: Intact Required Braces or Orthoses: Spinal Brace Spinal Brace:  (LSO applied at EOB) Restrictions Weight Bearing Restrictions Per Provider Order: No       Mobility Bed Mobility Overal bed mobility: Needs Assistance Bed Mobility: Sidelying to Sit, Sit to Sidelying Rolling: Supervision, Used rails Sidelying to sit: Supervision, Used rails     Sit to sidelying: Min assist General bed mobility comments: BLE  management assist to return to supine    Transfers Overall transfer level: Needs assistance Equipment used: Rolling walker (2 wheels) Transfers: Sit to/from Stand, Bed to chair/wheelchair/BSC Sit to Stand: Supervision     Step pivot transfers: Supervision, Contact guard assist     General transfer comment: able to stand from EOB to RW with CGA, increased time and 2 attempts to reach standing; ambulated ~120 ft using RW with CGA and performed step pivot BSC<>bed with RW use and CGA/SBA     Balance Overall balance assessment: Needs assistance Sitting-balance support: Feet supported Sitting balance-Leahy Scale: Good     Standing balance support: Bilateral upper extremity supported, During functional activity, Reliant on assistive device for balance Standing balance-Leahy Scale: Fair                             ADL either performed or assessed with clinical judgement   ADL Overall ADL's : Needs assistance/impaired                         Toilet Transfer: Supervision/safety;BSC/3in1;Rolling walker (2 wheels);Contact guard assist   Toileting- Clothing Manipulation and Hygiene: Sit to/from stand;Minimal assistance;Adhering to back precautions Toileting - Clothing Manipulation Details (indicate cue type and reason): thoroughness in standing     Functional mobility during ADLs: Contact guard assist;Supervision/safety;Rolling walker (2 wheels)      Extremity/Trunk Assessment              Vision       Perception     Praxis     Communication Communication Communication: No apparent difficulties   Cognition Arousal: Alert Behavior  During Therapy: WFL for tasks assessed/performed                                 Following commands: Intact        Cueing   Cueing Techniques: Verbal cues, Visual cues  Exercises      Shoulder Instructions       General Comments no c/o dizziness throughout session, had been on fluids for the  last hour or so    Pertinent Vitals/ Pain       Pain Assessment Pain Assessment: Faces Pain Score: 4  Faces Pain Scale: Hurts little more Pain Location: low back, R hip Pain Descriptors / Indicators: Grimacing, Operative site guarding, Sore Pain Intervention(s): Monitored during session, Limited activity within patient's tolerance, Repositioned  Home Living                                          Prior Functioning/Environment              Frequency  Min 2X/week        Progress Toward Goals  OT Goals(current goals can now be found in the care plan section)        Plan      Co-evaluation                 AM-PAC OT 6 Clicks Daily Activity     Outcome Measure   Help from another person eating meals?: None Help from another person taking care of personal grooming?: A Little Help from another person toileting, which includes using toliet, bedpan, or urinal?: A Little Help from another person bathing (including washing, rinsing, drying)?: A Lot Help from another person to put on and taking off regular upper body clothing?: A Little Help from another person to put on and taking off regular lower body clothing?: A Lot 6 Click Score: 17    End of Session Equipment Utilized During Treatment: Rolling walker (2 wheels);Back brace  OT Visit Diagnosis: Unsteadiness on feet (R26.81);Muscle weakness (generalized) (M62.81);Pain   Activity Tolerance Patient tolerated treatment well   Patient Left in bed;with bed alarm set;with call bell/phone within reach   Nurse Communication Mobility status        Time: 8376-8357 OT Time Calculation (min): 19 min  Charges: OT General Charges $OT Visit: 1 Visit OT Treatments $Self Care/Home Management : 8-22 mins  Tokiko Diefenderfer, OTR/L  08/27/24, 4:56 PM   Elka Satterfield E Shivank Pinedo 08/27/2024, 4:54 PM

## 2024-08-27 NOTE — Progress Notes (Signed)
 Physical Therapy Treatment Patient Details Name: Mary Mullins MRN: [id number] DOB: 07/19/1946 Today's Date: 08/27/2024   History of Present Illness Pt is an 78 y/o F s/p elective L2-5 posterolateral arthrodesis and posterior segmental instrumentation, L4/5 decompression on 08/24/24. PMH significant for aorthic atherosclerosis, L2 compression fx, HTN, pre-DM, PE, pulmonary nodule, RBBB.    PT Comments  Pt was long sitting in recliner upon arrival. Agreeable to session and ambulate/step performance however pt requested to use BR prior. Pt endorse severe onset of dizziness with standing and taking steps form recliner to [facility]. BP dropped to 93/65. Pt unable to progress due to her symptoms of dizziness. After successful urination, pt returned to bed with symptoms slightly improving once BLEs were elevated. Acute OPT w3ill continue to follow and progress per current POC.    If plan is discharge home, recommend the following: A little help with walking and/or transfers;A lot of help with bathing/dressing/bathroom;Assistance with cooking/housework;Assist for transportation;Help with stairs or ramp for entrance     Equipment Recommendations  None recommended by PT       Precautions / Restrictions Precautions Precautions: Back;Fall Precaution Booklet Issued: No Recall of Precautions/Restrictions: Intact Required Braces or Orthoses: Spinal Brace Spinal Brace:  (LSO applied at EOB) Restrictions Weight Bearing Restrictions Per Provider Order: No     Mobility  Bed Mobility Overal bed mobility: Needs Assistance Bed Mobility: Sit to Supine, Sit to Sidelying Rolling: Supervision, Used rails Sidelying to sit: Supervision, Used rails Supine to sit: Supervision, Used rails Sit to supine: Min assist, Used rails Sit to sidelying: Min assist General bed mobility comments: Min assist to return to supine from EOB sitting.    Transfers Overall transfer level: Needs assistance Equipment used:  Rolling walker (2 wheels), None Transfers: Sit to/from Stand Sit to Stand: Supervision  General transfer comment: pt was easily and safely able to stand and take steps from recliner to EOB. Planned to ambulate to actual BR but unable too due to severity of onset of dizziness. BP 93/65    Ambulation/Gait Ambulation/Gait assistance: Supervision Gait Distance (Feet): 3 Feet Assistive device: Rolling walker (2 wheels) Gait Pattern/deviations: Step-through pattern Gait velocity: decreased  General Gait Details: No LOB or safety concerns with ambulation a short distance. distance limited due to soft BP and symptoms of dizziness     Balance Overall balance assessment: Needs assistance Sitting-balance support: Feet supported Sitting balance-Leahy Scale: Good     Standing balance support: Bilateral upper extremity supported, During functional activity, Reliant on assistive device for balance Standing balance-Leahy Scale: Good       Communication Communication Communication: No apparent difficulties  Cognition Arousal: Alert Behavior During Therapy: WFL for tasks assessed/performed   PT - Cognitive impairments: No apparent impairments    PT - Cognition Comments: Pt is A and O x 4. session was greatly limited by her soft BP/dizziness with activity Following commands: Intact      Cueing Cueing Techniques: Verbal cues, Visual cues     General Comments General comments (skin integrity, edema, etc.): Encouraged pt to drink water. MD/RN/PA made aware      Pertinent Vitals/Pain Pain Assessment Pain Assessment: 0-10 Pain Score: 3  Pain Location: low back Pain Descriptors / Indicators: Grimacing, Operative site guarding, Sore Pain Intervention(s): Limited activity within patient's tolerance, Monitored during session, Premedicated before session, Repositioned     PT Goals (current goals can now be found in the care plan section) Acute Rehab PT Goals Patient Stated Goal: go  home Progress towards  PT goals: Progressing toward goals    Frequency    BID       AM-PAC PT 6 Clicks Mobility   Outcome Measure  Help needed turning from your back to your side while in a flat bed without using bedrails?: A Little Help needed moving from lying on your back to sitting on the side of a flat bed without using bedrails?: A Little Help needed moving to and from a bed to a chair (including a wheelchair)?: A Little Help needed standing up from a chair using your arms (e.g., wheelchair or bedside chair)?: A Little Help needed to walk in hospital room?: A Little Help needed climbing 3-5 steps with a railing? : A Little 6 Click Score: 18    End of Session Equipment Utilized During Treatment: Gait belt;Back brace Activity Tolerance: Patient tolerated treatment well Patient left: in chair;with call bell/phone within reach;with chair alarm set Nurse Communication: Mobility status PT Visit Diagnosis: Other abnormalities of gait and mobility (R26.89);Difficulty in walking, not elsewhere classified (R26.2);Pain     Time: 9047-8985 PT Time Calculation (min) (ACUTE ONLY): 22 min  Charges:    $Therapeutic Activity: 8-22 mins PT General Charges $$ ACUTE PT VISIT: 1 Visit                    Rankin Essex PTA 08/27/24, 10:30 AM

## 2024-08-27 NOTE — TOC Progression Note (Signed)
 Transition of Care [facility]) - Progression Note    Patient Details  Name: Mary Mullins MRN: [id number] Date of Birth: 07-24-1946  [facility] CM/SW Contact  Alvaro Louder, [place] Phone Number: 08/27/2024, 1:10 PM  Clinical Narrative:   Patient changed her mind about [facility] and decided that she wanted to go with [facility] at discharge.   TOC to follow discharge    Expected Discharge Plan: Home w Home Health Services Barriers to Discharge: Continued Medical Work up               Expected Discharge Plan and Services       Living arrangements for the past 2 months: Single Family Home                                       Social Drivers of Health (SDOH) Interventions SDOH Screenings   Food Insecurity: No Food Insecurity (08/25/2024)  Housing: Low Risk  (08/25/2024)  Transportation Needs: No Transportation Needs (08/25/2024)  Utilities: Not At Risk (08/25/2024)  Financial Resource Strain: Low Risk  (07/07/2022)   Received from [facility] visits prior to 12/25/2022., [facility]  Physical Activity: Inactive (07/07/2022)   Received from [facility] visits prior to 12/25/2022., [facility]  Social Connections: Unknown (08/25/2024)  Stress: No Stress Concern Present (07/07/2022)   Received from [facility] visits prior to 12/25/2022., [facility]  Tobacco Use: Low Risk  (08/24/2024)    Readmission Risk Interventions    07/13/2023    9:08 AM  Readmission Risk Prevention Plan  Post Dischage Appt Complete  Medication Screening Complete  Transportation Screening Complete

## 2024-08-27 NOTE — Progress Notes (Signed)
 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   POD3: Pt doing ok this morning. Complaining of the same pre-op leg pain POD2: some dizziness today. Having constipation POD1: Having expected pain  Physical Exam: Vitals:   08/26/24 2017 08/27/24 0308  BP: 124/63 (!) 159/77  Pulse: 73 76  Resp: 17 18  Temp: 98.4 F (36.9 C) 98.5 F (36.9 C)  SpO2: 97% 95%    AA Ox3 CNI  Strength:5/5 throughout BLE  Dressing c/d/I  Drain 130 recorded overnight   Data:  Other tests/results: CBC reviewed  Assessment/Plan:  Mary Mullins is s/p open L4-5 decompression. L2-5 PSF.   - mobilize - pain control - DVT prophylaxis - PTOT - Monitor drain output. Appears to be clearing up. Will re-evaluate for removal this afternoon. - Pt reports having a BM yesterday. continue to escalated bowel regimen  Edsel Goods PA-C Department of Neurosurgery

## 2024-08-27 NOTE — Progress Notes (Signed)
 Physical Therapy Treatment Patient Details Name: Mary Mullins MRN: [id number] DOB: 1946/10/10 Today's Date: 08/27/2024   History of Present Illness Pt is an 78 y/o F s/p elective L2-5 posterolateral arthrodesis and posterior segmental instrumentation, L4/5 decompression on 08/24/24. PMH significant for aorthic atherosclerosis, L2 compression fx, HTN, pre-DM, PE, pulmonary nodule, RBBB.    PT Comments  Pt was side lying in bed upon arrival. She endorses feeling uncomfortable and wanted to get up OOB. Awaiting breakfast but eager for OOB to chair. Pt was able to roll L to short sit with increased time however no physical assistance. Educated on use of LSO and drain management. Pt safely stood and ambulate to recliner but request further ambulation attempts later in the day. RN tech in room at conclusion of session. Author will return shortly and continue to follow per current POC.     If plan is discharge home, recommend the following: A little help with walking and/or transfers;A lot of help with bathing/dressing/bathroom;Assistance with cooking/housework;Assist for transportation;Help with stairs or ramp for entrance     Equipment Recommendations  None recommended by PT       Precautions / Restrictions Precautions Precautions: Back;Fall Precaution Booklet Issued: No Recall of Precautions/Restrictions: Intact Required Braces or Orthoses: Spinal Brace Spinal Brace:  (LSO applied at EOB) Restrictions Weight Bearing Restrictions Per Provider Order: No     Mobility  Bed Mobility Overal bed mobility: Needs Assistance Bed Mobility: Rolling, Sidelying to Sit, Supine to Sit Rolling: Supervision, Used rails Sidelying to sit: Supervision, Used rails Supine to sit: Supervision, Used rails  General bed mobility comments: Increased time and did use bed rail however no actual physical assistance    Transfers Overall transfer level: Needs assistance Equipment used: Rolling walker (2  wheels), None Transfers: Sit to/from Stand Sit to Stand: Supervision  General transfer comment: Pt is able to stand from EOB with LSO donned and easily take steps from EOB to recliner. Requested to ambulate later in the day due wanting to eat breakfast first    Ambulation/Gait Ambulation/Gait assistance: Supervision Gait Distance (Feet): 10 Feet Assistive device: Rolling walker (2 wheels) Gait Pattern/deviations: Step-through pattern Gait velocity: decreased  General Gait Details: NO LOB or safety concerns with ambulation a short distance.    Balance Overall balance assessment: Needs assistance Sitting-balance support: Feet supported Sitting balance-Leahy Scale: Good     Standing balance support: Bilateral upper extremity supported, During functional activity, Reliant on assistive device for balance Standing balance-Leahy Scale: Good       Communication Communication Communication: No apparent difficulties  Cognition Arousal: Alert Behavior During Therapy: WFL for tasks assessed/performed   PT - Cognitive impairments: No apparent impairments    PT - Cognition Comments: Pt is A and O x 4. Pt has just woke up and requested to be repositioned due to pain. Following commands: Intact      Cueing Cueing Techniques: Verbal cues, Visual cues     General Comments General comments (skin integrity, edema, etc.): authro educated pt on wound vac and how to manage with LSO brace      Pertinent Vitals/Pain Pain Assessment Pain Assessment: 0-10 Pain Score: 4  Pain Location: low back Pain Descriptors / Indicators: Grimacing, Operative site guarding, Sore Pain Intervention(s): Limited activity within patient's tolerance, Monitored during session, Premedicated before session, Repositioned     PT Goals (current goals can now be found in the care plan section) Acute Rehab PT Goals Patient Stated Goal: go home Progress towards PT goals: Progressing  toward goals    Frequency     BID       AM-PAC PT 6 Clicks Mobility   Outcome Measure  Help needed turning from your back to your side while in a flat bed without using bedrails?: A Little Help needed moving from lying on your back to sitting on the side of a flat bed without using bedrails?: A Little Help needed moving to and from a bed to a chair (including a wheelchair)?: A Little Help needed standing up from a chair using your arms (e.g., wheelchair or bedside chair)?: A Little Help needed to walk in hospital room?: A Little Help needed climbing 3-5 steps with a railing? : A Little 6 Click Score: 18    End of Session Equipment Utilized During Treatment: Gait belt;Back brace Activity Tolerance: Patient tolerated treatment well Patient left: in chair;with call bell/phone within reach;with chair alarm set Nurse Communication: Mobility status PT Visit Diagnosis: Other abnormalities of gait and mobility (R26.89);Difficulty in walking, not elsewhere classified (R26.2);Pain     Time: 0810-0826 PT Time Calculation (min) (ACUTE ONLY): 16 min  Charges:    $Therapeutic Activity: 8-22 mins PT General Charges $$ ACUTE PT VISIT: 1 Visit                     Rankin Essex PTA 08/27/24, 8:51 AM

## 2024-08-28 ENCOUNTER — Other Ambulatory Visit: Payer: Self-pay

## 2024-08-28 DIAGNOSIS — M4316 Spondylolisthesis, lumbar region: Secondary | ICD-10-CM | POA: Diagnosis not present

## 2024-08-28 MED ORDER — POLYETHYLENE GLYCOL 3350 17 GM/SCOOP PO POWD
17.0000 g | Freq: Every day | ORAL | 0 refills | Status: DC | PRN
  Filled 2024-08-28: qty 238, 14d supply, fill #0

## 2024-08-28 MED ORDER — SENNOSIDES-DOCUSATE SODIUM 8.6-50 MG PO TABS
2.0000 | ORAL_TABLET | Freq: Two times a day (BID) | ORAL | 0 refills | Status: DC | PRN
  Filled 2024-08-28: qty 30, 8d supply, fill #0

## 2024-08-28 MED ORDER — CELECOXIB 200 MG PO CAPS
200.0000 mg | ORAL_CAPSULE | Freq: Two times a day (BID) | ORAL | 0 refills | Status: AC
  Filled 2024-08-28: qty 60, 30d supply, fill #0

## 2024-08-28 MED ORDER — OXYCODONE HCL 5 MG PO TABS
5.0000 mg | ORAL_TABLET | ORAL | 0 refills | Status: DC | PRN
  Filled 2024-08-28: qty 30, 5d supply, fill #0

## 2024-08-28 MED ORDER — METHOCARBAMOL 500 MG PO TABS
500.0000 mg | ORAL_TABLET | Freq: Four times a day (QID) | ORAL | 0 refills | Status: DC | PRN
  Filled 2024-08-28: qty 120, 30d supply, fill #0

## 2024-08-28 NOTE — Discharge Summary (Signed)
 Physician Discharge Summary  Patient ID: Mary Mullins MRN: [id number] DOB/AGE: 1946-07-30 78 y.o.  Admit date: 08/24/2024 Discharge date: 08/28/2024  Admission Diagnoses:   Pseudarthrosis following spinal fusion   Lumbar adjacent segment disease with spondylolisthesis  Discharge Diagnoses:  Principal Problem:   S/P lumbar fusion Active Problems:   Pseudarthrosis following spinal fusion   Lumbar adjacent segment disease with spondylolisthesis   Discharged Condition: good  Hospital Course: Mary Mullins was admitted for operative intervention on her low back.  She tolerated the procedure well, and worked with physical and Occupational Therapy over number of days until she was stable for discharge.  Her drain was removed and dressing changed on postoperative day 4.  Consults: None  Significant Diagnostic Studies: radiology: X-Ray: good placement of implants  Treatments: surgery: L2-5 posterior fusion with L4-5 decompression  Discharge Exam: Blood pressure 104/67, pulse 98, temperature 98.4 F (36.9 C), resp. rate 16, SpO2 100%. General appearance: alert and cooperative CNI MAEW  Disposition: Discharge disposition: 06-Home-Health Care Svc       Discharge Instructions     Incentive spirometry RT   Complete by: As directed    Remove dressing in 24 hours   Complete by: As directed       Allergies as of 08/28/2024   No Known Allergies      Medication List     TAKE these medications    amLODipine  10 MG tablet Commonly known as: NORVASC  Take 10 mg by mouth in the morning.   celecoxib 200 MG capsule Commonly known as: CELEBREX Take 1 capsule (200 mg total) by mouth 2 (two) times daily.   methocarbamol  500 MG tablet Commonly known as: ROBAXIN  Take 1 tablet (500 mg total) by mouth every 6 (six) hours as needed for muscle spasms.   oxyCODONE  5 MG immediate release tablet Commonly known as: Oxy IR/ROXICODONE  Take 1 tablet (5 mg total) by mouth every 4  (four) hours as needed for moderate pain (pain score 4-6).   polyethylene glycol 17 g packet Commonly known as: MIRALAX / GLYCOLAX Take 17 g by mouth daily as needed for moderate constipation.   rosuvastatin  10 MG tablet Commonly known as: CRESTOR  Take 10 mg by mouth in the morning.   senna-docusate 8.6-50 MG tablet Commonly known as: Senokot-S Take 2 tablets by mouth 2 (two) times daily as needed for mild constipation.        Contact information for after-discharge care     Home Medical Care     [facility]) .   Service: Home Health Services Contact information: [address] [place]  [address] [phone]                     Signed: REEVES DAISY 08/28/2024, 10:02 AM

## 2024-08-28 NOTE — Plan of Care (Signed)

## 2024-08-28 NOTE — Progress Notes (Signed)
 PT Cancellation Note  Patient Details Name: Mary Mullins MRN: [id number] DOB: 04-10-1946   Cancelled Treatment:     Pt attempt for BID 2nd session. Pt politely refused session. Continue to follow POC tomorrow.   Jsaon Yoo 08/28/2024, 3:53 PM

## 2024-08-28 NOTE — Progress Notes (Signed)
 Occupational Therapy Treatment Patient Details Name: Mary Mullins MRN: [id number] DOB: 03-12-1946 Today's Date: 08/28/2024   History of present illness Pt is an 78 y/o F s/p elective L2-5 posterolateral arthrodesis and posterior segmental instrumentation, L4/5 decompression on 08/24/24. PMH significant for aorthic atherosclerosis, L2 compression fx, HTN, pre-DM, PE, pulmonary nodule, RBBB.   OT comments  Pt is seated on toilet on arrival. Pleasant and agreeable to OT session. She reports mild headache and some lower back tightness during ambulation. Nurse provided meds at end of session. Pt performed all aspects of toileting and LB dressing with SBA. She demo standing grooming tasks with SBA and no LOB. Adheres to all back precautions during ADL performance and ambulation. Ambulated ~100 ft in hallway using RW with SBA, occasional cues for more upright posture. Able to return self to bed with MOD I and increased time. Re-edu on LSO wear, back precautions and adherance during ADL performance, e.g. bathing, LB dressing, kitchen setup, etc as well as fluid intake to prevent dizziness. Pt verbalized understanding. Pt left with all needs in place and will cont to require skilled acute OT services to maximize her safety and IND to return to PLOF.       If plan is discharge home, recommend the following:  A little help with walking and/or transfers;A little help with bathing/dressing/bathroom;Assistance with cooking/housework   Equipment Recommendations  BSC/3in1;Other (comment) (RW)    Recommendations for Other Services      Precautions / Restrictions Precautions Precautions: Back;Fall Precaution Booklet Issued: No Recall of Precautions/Restrictions: Intact Precaution/Restrictions Comments: Pt recalled 3/3 back precautions (BLT) Required Braces or Orthoses: Spinal Brace Spinal Brace: Lumbar corset;Applied in sitting position Restrictions Weight Bearing Restrictions Per Provider Order:  No       Mobility Bed Mobility Overal bed mobility: Needs Assistance Bed Mobility: Sit to Supine       Sit to supine: Used rails, Supervision   General bed mobility comments: increased time to return to supine using bed rails    Transfers Overall transfer level: Needs assistance Equipment used: Rolling walker (2 wheels) Transfers: Sit to/from Stand Sit to Stand: Supervision           General transfer comment: recalls back precautions and adheres well during ADLs and mobility, SBA for ambulation using RW with cues for upright posture at times     Balance Overall balance assessment: Modified Independent Sitting-balance support: Feet supported Sitting balance-Leahy Scale: Good     Standing balance support: Bilateral upper extremity supported, Reliant on assistive device for balance, During functional activity Standing balance-Leahy Scale: Good Standing balance comment: RW for support                           ADL either performed or assessed with clinical judgement   ADL Overall ADL's : Needs assistance/impaired     Grooming: Wash/dry face;Wash/dry hands;Standing;Supervision/safety               Lower Body Dressing: Supervision/safety;Contact guard assist;Sit to/from stand;Adhering to back precautions Lower Body Dressing Details (indicate cue type and reason): to donn mesh underwear adhering to back precautions Toilet Transfer: Supervision/safety;Rolling walker (2 wheels);Contact guard Actor;Ambulation;Grab bars Toilet Transfer Details (indicate cue type and reason): increased time and cues of grab bar to stand from toilet x2 Toileting- Clothing Manipulation and Hygiene: Sit to/from stand;Adhering to back precautions;Contact guard assist;Supervision/safety Toileting - Clothing Manipulation Details (indicate cue type and reason): standing peri-care after cont urine on toilet  Functional mobility during ADLs: Contact guard  assist;Supervision/safety;Rolling walker (2 wheels)      Extremity/Trunk Assessment              Vision       Perception     Praxis     Communication Communication Communication: No apparent difficulties   Cognition Arousal: Alert Behavior During Therapy: WFL for tasks assessed/performed                                 Following commands: Intact        Cueing   Cueing Techniques: Verbal cues, Visual cues  Exercises Other Exercises Other Exercises: Re-edu on LSO wear, back precautions and adherance during ADL performance, e.g. bathing, LB dressing, kitchen setup, etc. Pt verbalized understanding.    Shoulder Instructions       General Comments      Pertinent Vitals/ Pain       Pain Assessment Pain Assessment: Faces Faces Pain Scale: Hurts little more Pain Location: Low back Pain Descriptors / Indicators: Grimacing, Operative site guarding, Sore Pain Intervention(s): Limited activity within patient's tolerance, RN gave pain meds during session, Monitored during session  Home Living                                          Prior Functioning/Environment              Frequency  Min 2X/week        Progress Toward Goals  OT Goals(current goals can now be found in the care plan section)  Progress towards OT goals: Progressing toward goals  Acute Rehab OT Goals Patient Stated Goal: get stronger OT Goal Formulation: With patient Time For Goal Achievement: 09/08/24 Potential to Achieve Goals: Good  Plan      Co-evaluation                 AM-PAC OT 6 Clicks Daily Activity     Outcome Measure   Help from another person eating meals?: None Help from another person taking care of personal grooming?: A Little Help from another person toileting, which includes using toliet, bedpan, or urinal?: A Little Help from another person bathing (including washing, rinsing, drying)?: A Little Help from another person  to put on and taking off regular upper body clothing?: A Little Help from another person to put on and taking off regular lower body clothing?: A Little 6 Click Score: 19    End of Session Equipment Utilized During Treatment: Rolling walker (2 wheels);Back brace  OT Visit Diagnosis: Unsteadiness on feet (R26.81);Muscle weakness (generalized) (M62.81);Pain   Activity Tolerance Patient tolerated treatment well   Patient Left in bed;with bed alarm set;with call bell/phone within reach   Nurse Communication Mobility status        Time: 8545-8488 OT Time Calculation (min): 17 min  Charges: OT General Charges $OT Visit: 1 Visit OT Treatments $Self Care/Home Management : 8-22 mins  Philbert Ocallaghan, OTR/L  08/28/24, 3:28 PM   Clarie Camey E Anavey Coombes 08/28/2024, 3:26 PM

## 2024-08-28 NOTE — Progress Notes (Signed)
 Neurosurgery Progress Note  History: Alonni S Castello is here for adjacent segment disease and pseudoarthrosis.  POD4: Doing well POD3: Pt doing ok this morning. Complaining of the same pre-op leg pain POD2: some dizziness today. Having constipation POD1: Having expected pain  Physical Exam: Vitals:   08/28/24 0440 08/28/24 0736  BP: (!) 145/81 104/67  Pulse: 78 98  Resp: 19 16  Temp: 98.6 F (37 C) 98.4 F (36.9 C)  SpO2: 98% 100%    AA Ox3 CNI  Strength:5/5 throughout BLE  Dressing c/d/I  Drain 130 recorded overnight  - removed  Data:  Other tests/results: CBC reviewed  Assessment/Plan:  Randie GORMAN Llanos is s/p open L4-5 decompression. L2-5 PSF.   - mobilize - pain control - DVT prophylaxis - PTOT - Pt reports having a BM yesterday. continue to escalated bowel regimen  Reeves Daisy MD Department of Neurosurgery

## 2024-08-28 NOTE — Progress Notes (Signed)
 Physical Therapy Treatment Patient Details Name: Mary Mullins MRN: [id number] DOB: 1945/12/04 Today's Date: 08/28/2024   History of Present Illness Pt is an 78 y/o F s/p elective L2-5 posterolateral arthrodesis and posterior segmental instrumentation, L4/5 decompression on 08/24/24. PMH significant for aorthic atherosclerosis, L2 compression fx, HTN, pre-DM, PE, pulmonary nodule, RBBB.    PT Comments  Arrived to pt sitting up in bed. She stated that she would stopping at her daughters house halfway back home to be able to get up and move. Pt was A and O x4. Hesitant to move until insisting on the importance of proper stair training. Pt was able to state the three back precautions. Provided verbal cues and supervision assist while patient transferred from sitting to EOB. Pt was educated on how to go up/down single stair. Ambulated 80 ft with RW with CGA. Discharge recs remain appropriate.   If plan is discharge home, recommend the following: A little help with walking and/or transfers;A little help with bathing/dressing/bathroom;Assistance with cooking/housework;Assist for transportation;Help with stairs or ramp for entrance   Can travel by private vehicle        Equipment Recommendations  None recommended by PT    Recommendations for Other Services       Precautions / Restrictions Precautions Precautions: Back;Fall Precaution Booklet Issued: No Recall of Precautions/Restrictions: Intact Precaution/Restrictions Comments: Pt recalled 3/3 back precautions (BLT) Required Braces or Orthoses: Spinal Brace Spinal Brace: Lumbar corset;Applied in sitting position Restrictions Weight Bearing Restrictions Per Provider Order: No     Mobility  Bed Mobility Overal bed mobility: Needs Assistance Bed Mobility: Rolling, Sidelying to Sit, Supine to Sit, Sit to Supine, Sit to Sidelying Rolling: Supervision, Used rails Sidelying to sit: Supervision, Used rails Supine to sit: Supervision,  Used rails Sit to supine: Min assist, Used rails Sit to sidelying: Min assist General bed mobility comments: Able to perform proper bed mobility form with some cueing.    Transfers Overall transfer level: Needs assistance Equipment used: Rolling walker (2 wheels) Transfers: Sit to/from Stand Sit to Stand: Supervision           General transfer comment: Pt was able to recall back precautions. Sustained good control through sit to stand and stand to sit.    Ambulation/Gait Ambulation/Gait assistance: Contact guard assist Gait Distance (Feet): 80 Feet Assistive device: Rolling walker (2 wheels) Gait Pattern/deviations: Step-through pattern       General Gait Details: No LOB during ambulation.   Stairs Stairs: Yes Stairs assistance: Contact guard assist Stair Management: With walker, Forwards Number of Stairs: 1 General stair comments: Pt went up and down single stair to simulate at home idea. Gave verbal cues to ensure pt understood which foot to go up/down with.         Balance Overall balance assessment: Modified Independent Sitting-balance support: Feet supported Sitting balance-Leahy Scale: Good Sitting balance - Comments: steady static and dynamic sitting   Standing balance support: Bilateral upper extremity supported, Reliant on assistive device for balance, During functional activity Standing balance-Leahy Scale: Good            Communication Communication Communication: No apparent difficulties  Cognition Arousal: Alert Behavior During Therapy: WFL for tasks assessed/performed   PT - Cognitive impairments: No apparent impairments         PT - Cognition Comments: Pt is A and O x 4. session was greatly limited by her soft BP/dizziness with activity Following commands: Intact      Cueing Cueing Techniques: Verbal cues, Visual  cues  Exercises      General Comments General comments (skin integrity, edema, etc.): Measured BP 136/84 while  sitting on EOB.      Pertinent Vitals/Pain Pain Assessment Pain Assessment: PAINAD Breathing: normal Negative Vocalization: none Facial Expression: smiling or inexpressive Body Language: relaxed Consolability: no need to console PAINAD Score: 0 Pain Location: Low back Pain Descriptors / Indicators: Grimacing, Operative site guarding, Sore Pain Intervention(s): RN gave pain meds during session     PT Goals (current goals can now be found in the care plan section) Acute Rehab PT Goals Patient Stated Goal: go home Progress towards PT goals: Progressing toward goals    Frequency    BID       AM-PAC PT 6 Clicks Mobility   Outcome Measure  Help needed turning from your back to your side while in a flat bed without using bedrails?: A Little Help needed moving from lying on your back to sitting on the side of a flat bed without using bedrails?: A Little Help needed moving to and from a bed to a chair (including a wheelchair)?: A Little Help needed standing up from a chair using your arms (e.g., wheelchair or bedside chair)?: A Little Help needed to walk in hospital room?: A Little Help needed climbing 3-5 steps with a railing? : A Little 6 Click Score: 18    End of Session Equipment Utilized During Treatment: Gait belt;Back brace Activity Tolerance: Patient tolerated treatment well Patient left: in bed;with bed alarm set;with call bell/phone within reach Nurse Communication: Mobility status PT Visit Diagnosis: Other abnormalities of gait and mobility (R26.89);Difficulty in walking, not elsewhere classified (R26.2);Pain Pain - part of body:  (Back)     Time: 9093-9075 PT Time Calculation (min) (ACUTE ONLY): 18 min  Charges:    $Gait Training: 8-22 mins PT General Charges $$ ACUTE PT VISIT: 1 Visit                     Signe Anish Vana SPTA    Mary Mullins 08/28/2024, 10:45 AM

## 2024-08-28 NOTE — Progress Notes (Signed)
 Patient reported to Staff member that she took her home Doculax.

## 2024-08-29 DIAGNOSIS — M4316 Spondylolisthesis, lumbar region: Secondary | ICD-10-CM | POA: Diagnosis not present

## 2024-08-29 NOTE — Progress Notes (Signed)
 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  POD5: Doing well this morning and eager to go home. POD4: Doing well POD3: Pt doing ok this morning. Complaining of the same pre-op leg pain POD2: some dizziness today. Having constipation POD1: Having expected pain  Physical Exam: Vitals:   08/29/24 0835 08/29/24 0850  BP: (!) 169/79 (!) 167/77  Pulse: 67 74  Resp: 16 16  Temp: 98.2 F (36.8 C)   SpO2: 98%     AA Ox3 CNI  Strength:5/5 throughout BLE  Dressing c/d/I   Data:  Other tests/results: CBC reviewed  Assessment/Plan:  Mary Mullins is s/p open L4-5 decompression. L2-5 PSF.   - mobilize - pain control - DVT prophylaxis - PTOT  Edsel Goods PA-C Department of Neurosurgery

## 2024-08-29 NOTE — Plan of Care (Signed)

## 2024-08-29 NOTE — Plan of Care (Signed)
 Patient is Alert and oriented x4. Clear lung sounds, on room air no noted cough. Abdomen soft, last bowel movement 11/4, +bowel sounds. Voiding without any difficulty. +CMS, able to feel sensation x4 extremities, patient able to wiggle fingers and toes,+dorsi/plantar flex, denies numbness or tingling. Tolerating diet. Ambulating standby assist out of bed. Honeycomb dressing to lower back C/D/I. Hourly rounding performed, fall precautions maintained, and call bell within reach.   Problem: Education: Goal: Knowledge of General Education information will improve Description: Including pain rating scale, medication(s)/side effects and non-pharmacologic comfort measures Outcome: Progressing   Problem: Health Behavior/Discharge Planning: Goal: Ability to manage health-related needs will improve Outcome: Progressing   Problem: Clinical Measurements: Goal: Ability to maintain clinical measurements within normal limits will improve Outcome: Progressing Goal: Will remain free from infection Outcome: Progressing Goal: Diagnostic test results will improve Outcome: Progressing Goal: Respiratory complications will improve Outcome: Progressing Goal: Cardiovascular complication will be avoided Outcome: Progressing   Problem: Activity: Goal: Risk for activity intolerance will decrease Outcome: Progressing   Problem: Nutrition: Goal: Adequate nutrition will be maintained Outcome: Progressing   Problem: Coping: Goal: Level of anxiety will decrease Outcome: Progressing   Problem: Elimination: Goal: Will not experience complications related to bowel motility Outcome: Progressing Goal: Will not experience complications related to urinary retention Outcome: Progressing   Problem: Pain Managment: Goal: General experience of comfort will improve and/or be controlled Outcome: Progressing   Problem: Safety: Goal: Ability to remain free from injury will improve Outcome: Progressing   Problem:  Skin Integrity: Goal: Risk for impaired skin integrity will decrease Outcome: Progressing   Problem: Education: Goal: Ability to verbalize activity precautions or restrictions will improve Outcome: Progressing Goal: Knowledge of the prescribed therapeutic regimen will improve Outcome: Progressing Goal: Understanding of discharge needs will improve Outcome: Progressing   Problem: Activity: Goal: Ability to avoid complications of mobility impairment will improve Outcome: Progressing Goal: Ability to tolerate increased activity will improve Outcome: Progressing Goal: Will remain free from falls Outcome: Progressing   Problem: Bowel/Gastric: Goal: Gastrointestinal status for postoperative course will improve Outcome: Progressing   Problem: Clinical Measurements: Goal: Ability to maintain clinical measurements within normal limits will improve Outcome: Progressing Goal: Postoperative complications will be avoided or minimized Outcome: Progressing Goal: Diagnostic test results will improve Outcome: Progressing   Problem: Pain Management: Goal: Pain level will decrease Outcome: Progressing   Problem: Skin Integrity: Goal: Will show signs of wound healing Outcome: Progressing   Problem: Health Behavior/Discharge Planning: Goal: Identification of resources available to assist in meeting health care needs will improve Outcome: Progressing   Problem: Bladder/Genitourinary: Goal: Urinary functional status for postoperative course will improve Outcome: Progressing

## 2024-08-29 NOTE — Progress Notes (Signed)
 Physical Therapy Treatment Patient Details Name: Mary Mullins MRN: [id number] DOB: 1946/01/21 Today's Date: 08/29/2024   History of Present Illness Pt is an 78 y/o F s/p elective L2-5 posterolateral arthrodesis and posterior segmental instrumentation, L4/5 decompression on 08/24/24. PMH significant for aorthic atherosclerosis, L2 compression fx, HTN, pre-DM, PE, pulmonary nodule, RBBB.    PT Comments  Arrived to pt sitting in bed with HOB raised. Pt stated that she was ready to go home and that her husband was feeling better. Pt was A and O x4 and was ready to ambulate before having her breakfast. Checked pt's BP before activity and the value was not concerning. Provided supervision A for bed mobility. CGA was provided for sit to stand to RW. Pt was able to ambulate 100 ft with RW given CGA. Pt complained of achiness in lower back during and following sit to stand transfer. Ambulated well but did mention that their back felt crampy and that their R hip/groin area had some pain. Discharge recs still apply.   If plan is discharge home, recommend the following: A little help with walking and/or transfers;A little help with bathing/dressing/bathroom;Assistance with cooking/housework;Assist for transportation;Help with stairs or ramp for entrance   Can travel by private vehicle        Equipment Recommendations  None recommended by PT    Recommendations for Other Services       Precautions / Restrictions Precautions Precautions: Back;Fall Precaution Booklet Issued: No Recall of Precautions/Restrictions: Intact Precaution/Restrictions Comments: Pt recalled 3/3 back precautions Required Braces or Orthoses: Spinal Brace Spinal Brace: Lumbar corset;Applied in sitting position     Mobility  Bed Mobility Overal bed mobility: Needs Assistance Bed Mobility: Rolling, Supine to Sit, Sit to Supine Rolling: Supervision, Used rails Sidelying to sit: Supervision, Used rails Supine to sit:  Supervision, Used rails Sit to supine: Used rails, Supervision Sit to sidelying: Supervision, Used rails General bed mobility comments: Good mechanics with bed mobility    Transfers Overall transfer level: Needs assistance Equipment used: Rolling walker (2 wheels) Transfers: Sit to/from Stand Sit to Stand: Contact guard assist, Supervision           General transfer comment: Reported having pain in lower back that made her grimace when performing sit to stand to RW    Ambulation/Gait Ambulation/Gait assistance: Contact guard assist Gait Distance (Feet): 100 Feet Assistive device: Rolling walker (2 wheels) Gait Pattern/deviations: Step-to pattern, Step-through pattern       General Gait Details: Pt able to ambulate successfully 100 ft. Complained of groin/hip pain while ambulating   Stairs         General stair comments: No stairs completed during this treatment. Pt was able to recall that you should go up with the good and down with the bad.   Wheelchair Mobility     Tilt Bed    Modified Rankin (Stroke Patients Only)       Balance Overall balance assessment: Modified Independent Sitting-balance support: Feet supported Sitting balance-Leahy Scale: Good Sitting balance - Comments: steady static and dynamic sitting   Standing balance support: Bilateral upper extremity supported, Reliant on assistive device for balance, During functional activity Standing balance-Leahy Scale: Good Standing balance comment: RW for support         Communication Communication Communication: No apparent difficulties  Cognition Arousal: Alert Behavior During Therapy: WFL for tasks assessed/performed   PT - Cognitive impairments: No apparent impairments       PT - Cognition Comments: Pt is A and O  x 4. Following commands: Intact      Cueing Cueing Techniques: Verbal cues, Visual cues  Exercises      General Comments General comments (skin integrity, edema, etc.):  Checked BP, HR, O2 before ambulating. BP was the main concern but was not a concern after reviewing findings.      Pertinent Vitals/Pain Pain Assessment Pain Assessment: No/denies pain Pain Score: 0-No pain Pain Location: Low back Pain Descriptors / Indicators: Grimacing, Operative site guarding, Sore (Described lower back pain as crampy) Pain Intervention(s): Limited activity within patient's tolerance, Monitored during session, Repositioned     PT Goals (current goals can now be found in the care plan section) Acute Rehab PT Goals Patient Stated Goal: go home Progress towards PT goals: Progressing toward goals    Frequency    BID       AM-PAC PT 6 Clicks Mobility   Outcome Measure  Help needed turning from your back to your side while in a flat bed without using bedrails?: A Little Help needed moving from lying on your back to sitting on the side of a flat bed without using bedrails?: A Little Help needed moving to and from a bed to a chair (including a wheelchair)?: A Little Help needed standing up from a chair using your arms (e.g., wheelchair or bedside chair)?: A Little Help needed to walk in hospital room?: A Little Help needed climbing 3-5 steps with a railing? : A Little 6 Click Score: 18    End of Session Equipment Utilized During Treatment: Gait belt;Back brace Activity Tolerance: Patient tolerated treatment well Patient left: in bed;with bed alarm set;with call bell/phone within reach Nurse Communication: Mobility status PT Visit Diagnosis: Other abnormalities of gait and mobility (R26.89);Difficulty in walking, not elsewhere classified (R26.2);Pain Pain - part of body:  (Lower back)     Time:  -     Charges:                            Signe Paterson SPTA    Earvin Blazier 08/29/2024, 10:38 AM

## 2024-08-29 NOTE — TOC Transition Note (Signed)
 Transition of Care [facility]) - Discharge Note   Patient Details  Name: Mary Mullins MRN: [id number] Date of Birth: May 04, 1946  [facility] CM/SW Contact:  Alvaro Louder, LCSW Phone Number: 08/29/2024, 3:57 PM   Clinical Narrative:   LCSWA confirmed with MD that patient is stable for discharge. LCSWA notified the patient and they are in agreement with discharge. LCSWA reached out to [facility] admissions coordinator an started service for patient. The patient reported that she would have her spouse come pick her up at discharge.  TOC signing off  Final next level of care: Home w Home Health Services Barriers to Discharge: No Barriers Identified   Patient Goals and CMS Choice            Discharge Placement              Patient chooses bed at:  (Home) Patient to be transferred to facility by: Spouse Name of family member notified: Self Patient and family notified of of transfer: 08/29/24  Discharge Plan and Services Additional resources added to the After Visit Summary for                            [facility] Arranged: PT, OT [facility] Agency: [facility] Date [facility] Agency Contacted: 08/29/24   Representative spoke with at [facility] Agency: Darleene  Social Drivers of Health (SDOH) Interventions SDOH Screenings   Food Insecurity: No Food Insecurity (08/25/2024)  Housing: Low Risk  (08/25/2024)  Transportation Needs: No Transportation Needs (08/25/2024)  Utilities: Not At Risk (08/25/2024)  Financial Resource Strain: Low Risk  (07/07/2022)   Received from [facility] visits prior to 12/25/2022., [facility]  Physical Activity: Inactive (07/07/2022)   [facility] from [facility] visits prior to 12/25/2022., [facility]  Social Connections: Unknown (08/25/2024)  Stress: No Stress Concern Present (07/07/2022)   Received from [facility] visits prior to 12/25/2022., [facility]  Tobacco Use: Low Risk   (08/24/2024)     Readmission Risk Interventions    07/13/2023    9:08 AM  Readmission Risk Prevention Plan  Post Dischage Appt Complete  Medication Screening Complete  Transportation Screening Complete

## 2024-08-30 ENCOUNTER — Telehealth: Payer: Self-pay | Admitting: Neurosurgery

## 2024-08-30 ENCOUNTER — Other Ambulatory Visit: Payer: Self-pay | Admitting: Physician Assistant

## 2024-08-30 MED ORDER — HYDROCODONE-ACETAMINOPHEN 5-325 MG PO TABS: 1.0000 | ORAL_TABLET | Freq: Four times a day (QID) | ORAL | 0 refills | Status: DC | PRN

## 2024-08-30 NOTE — Telephone Encounter (Signed)
 Called and left daughter a voicemail to let her know that new prescription for hydrocodone  was sent to [facility] pharmacy. Advised her to call our office with any questions or concerns

## 2024-08-30 NOTE — Telephone Encounter (Signed)
 Patient's daughter, Delon left a voicemail during lunch stating that when her mother received Oxycodone  in the hospital and it made her seem as if she had dementia. She is now home and states that the Oxycodone  is still affecting her mother adversely. She would like to know if there is something else that can be given. Please advise.

## 2024-09-02 NOTE — Progress Notes (Unsigned)
   REFERRING PHYSICIAN:  [name], [name], Do [address] [place],  [place] [address]  DOS: 08/24/24  PSF L2-L5 with open decompression L4-L5  HISTORY OF PRESENT ILL2NESS: Mary Mullins is approximately 2 weeks status post above surgery. Was given celebrex, robaxin , and oxycodone  on discharge from the hospital.   Called on 08/30/24 and was advised to stop oxycodone . Norco was sent to pharmacy.   She feels like her preop back and buttock pain is better. She is still having intermittent bilateral leg pain.   She is on celebrex, norco, robaxin . She needs a refill of norco.    PHYSICAL EXAMINATION:  General: Patient is well developed, well nourished, calm, collected, and in no apparent distress.   NEUROLOGICAL:  General: In no acute distress.   Awake, alert, oriented to person, place, and time.  Pupils equal round and reactive to light.  Facial tone is symmetric.     Strength:            Side Iliopsoas Quads Hamstring PF DF EHL  R 5 5 5 5 5 5   L 5 5 5 5 5 5    Incision c/d/I, staples removed without complication   ROS (Neurologic):  Negative except as noted above  IMAGING: Nothing new to review.   ASSESSMENT/PLAN:  Mary Mullins is doing well s/p above surgery. Treatment options reviewed with patient and following plan made:   - I have advised the patient to lift up to 10 pounds until 6 weeks after surgery (follow up with Dr. Clois).  - Reviewed wound care.  - No bending, twisting, or lifting.  - Continue on current medications including prn norco, celebrex, and robaxin .  - Refill of hydrocodone  sent to pharmacy. PMP reviewed and is appropriate.  - Follow up as scheduled in 4 weeks and prn.   BP was elevated. No symptoms of chest pain, shortness of breath, blurry vision, or headaches. Recommend that she recheck at home and call PCP if not improved. If she develops CP, SOB, blurry vision, or headaches, then she will go to ED.     Advised to contact the  office if any questions or concerns arise.  Glade Boys PA-C Department of neurosurgery

## 2024-09-06 ENCOUNTER — Ambulatory Visit (INDEPENDENT_AMBULATORY_CARE_PROVIDER_SITE_OTHER): Admitting: Orthopedic Surgery

## 2024-09-06 ENCOUNTER — Encounter: Payer: Self-pay | Admitting: Orthopedic Surgery

## 2024-09-06 VITALS — BP 180/74 | Temp 98.7°F | Ht 67.0 in | Wt 197.0 lb

## 2024-09-06 DIAGNOSIS — M48062 Spinal stenosis, lumbar region with neurogenic claudication: Secondary | ICD-10-CM

## 2024-09-06 DIAGNOSIS — Z981 Arthrodesis status: Secondary | ICD-10-CM

## 2024-09-06 DIAGNOSIS — M960 Pseudarthrosis after fusion or arthrodesis: Secondary | ICD-10-CM

## 2024-09-06 MED ORDER — HYDROCODONE-ACETAMINOPHEN 5-325 MG PO TABS: 1.0000 | ORAL_TABLET | Freq: Four times a day (QID) | ORAL | 0 refills | Status: DC | PRN

## 2024-09-06 NOTE — Patient Instructions (Signed)
 It was nice to see you today.   I am glad that you are feeling better!   Okay to get incision wet in the shower, do not submerge in pool or hot tub.   Call if any concerns about the incision such as redness, drainage, or fever/chills.   No bending, twisting, or lifting. You can lift up to 10 pounds until your follow up with Dr.  Clois in 4 weeks.   I sent a refill of hydrocodone  to your pharmacy. Continue to take the least amount needed and only take for severe pain. Remember this medication can make you sleepy and/or constipated.   Your blood pressure was elevated today. I want you to recheck it at home and follow up with your PCP if it remains high. If you have any chest pain, shortness of breath, blurry vision, or headaches then you need to go to ED.    Please call with any questions or concerns.   Glade Boys PA-C [phone]     The physicians and staff at [facility] are committed to providing excellent care. You may receive a survey asking for feedback about your experience at our office. We value you your feedback and appreciate you taking the time to to fill it out. The [facility] leadership team is also available to discuss your experience in person, feel free to contact us  [phone].

## 2024-09-14 NOTE — Telephone Encounter (Signed)
 Returned call to give recommendation for patient to start the Lexapro and to take with food to minimize GI upset. Patient is agreeable with this and will let us  know how she does with this medication.

## 2024-10-02 ENCOUNTER — Other Ambulatory Visit: Payer: Self-pay | Admitting: Neurosurgery

## 2024-10-02 ENCOUNTER — Ambulatory Visit: Admitting: Neurosurgery

## 2024-10-02 ENCOUNTER — Ambulatory Visit

## 2024-10-02 VITALS — BP 134/82

## 2024-10-02 DIAGNOSIS — M48062 Spinal stenosis, lumbar region with neurogenic claudication: Secondary | ICD-10-CM

## 2024-10-02 DIAGNOSIS — M960 Pseudarthrosis after fusion or arthrodesis: Secondary | ICD-10-CM

## 2024-10-02 DIAGNOSIS — Z981 Arthrodesis status: Secondary | ICD-10-CM

## 2024-10-02 NOTE — Progress Notes (Signed)
   REFERRING PHYSICIAN:  [name], [name], Do [address] [place],  [place] [address]  DOS: 08/24/24  PSF L2-L5 with open decompression L4-L5  HISTORY OF PRESENT ILLNESS: Mary Mullins is status post above surgery.   She is doing well.  She is working with PT.   PHYSICAL EXAMINATION:  General: Patient is well developed, well nourished, calm, collected, and in no apparent distress.   NEUROLOGICAL:  General: In no acute distress.   Awake, alert, oriented to person, place, and time.  Pupils equal round and reactive to light.  Facial tone is symmetric.     Strength:            Side Iliopsoas Quads Hamstring PF DF EHL  R 5 5 5 5 5 5   L 5 5 5 5 5 5    Incision c/d/I, staples removed without complication   ROS (Neurologic):  Negative except as noted above  IMAGING: No complications noted  ASSESSMENT/PLAN:  Mary Mullins is doing well s/p above surgery.   I have encouraged her to stand up as straight as she can.  We reviewed activity limitations.  She will continue to wear her brace when she is out of her house.  She will continue to work with therapy.  Will see her back in 6 weeks.  Reeves Daisy MD Department of neurosurgery

## 2024-11-07 ENCOUNTER — Other Ambulatory Visit: Payer: Self-pay | Admitting: Orthopedic Surgery

## 2024-11-07 DIAGNOSIS — Z981 Arthrodesis status: Secondary | ICD-10-CM

## 2024-11-09 NOTE — Progress Notes (Unsigned)
" ° °  REFERRING PHYSICIAN:  [name], [name], [address] [place],  [place] [address]  DOS: 08/24/24  PSF L2-L5 with open decompression L4-L5  HISTORY OF PRESENT ILL2NESS:  She was doing well at her last visit with Mary Mullins. She was to continue to wear her brace when she is out of the house.   She started PT last week and thinks it will help. She has more constant LBP tightness. Pain is worse with prolonged walking. Her preop right leg pain is occasional.   She is still taking prn robaxin . Needs a refill. Not taking celebrex .    PHYSICAL EXAMINATION:  General: Patient is well developed, well nourished, calm, collected, and in no apparent distress.   NEUROLOGICAL:  General: In no acute distress.   Awake, alert, oriented to person, place, and time.  Pupils equal round and reactive to light.  Facial tone is symmetric.     Strength:      Side Iliopsoas Quads Hamstring PF DF EHL  R 5 5 5 5 5 5   L 5 5 5 5 5 5    Incision well healed.    ROS (Neurologic):  Negative except as noted above  IMAGING: Lumbar xrays dated 11/13/24:  No complications noted.  Report for above xrays not yet available. Above xrays reviewed with Mary Mullins.    ASSESSMENT/PLAN:  Mary Mullins is doing well s/p above surgery. Treatment options reviewed with patient and following plan made:   - She can d/c back brace. - She may slowly return to activity as tolerated.  - She will f/u with Mary Mullins in 6 months with repeat xrays.   Advised to contact the office if any questions or concerns arise.  Glade Boys PA-C Department of neurosurgery "

## 2024-11-13 ENCOUNTER — Ambulatory Visit: Admitting: Orthopedic Surgery

## 2024-11-13 ENCOUNTER — Ambulatory Visit

## 2024-11-13 ENCOUNTER — Encounter: Payer: Self-pay | Admitting: Orthopedic Surgery

## 2024-11-13 VITALS — BP 134/86 | Temp 98.5°F | Ht 67.0 in | Wt 197.0 lb

## 2024-11-13 DIAGNOSIS — M960 Pseudarthrosis after fusion or arthrodesis: Secondary | ICD-10-CM

## 2024-11-13 DIAGNOSIS — M48062 Spinal stenosis, lumbar region with neurogenic claudication: Secondary | ICD-10-CM

## 2024-11-13 DIAGNOSIS — Z981 Arthrodesis status: Secondary | ICD-10-CM

## 2024-11-13 DIAGNOSIS — Z09 Encounter for follow-up examination after completed treatment for conditions other than malignant neoplasm: Secondary | ICD-10-CM | POA: Diagnosis not present

## 2024-11-13 MED ORDER — METHOCARBAMOL 500 MG PO TABS: 500.0000 mg | ORAL_TABLET | Freq: Four times a day (QID) | ORAL | 0 refills | Status: AC | PRN

## 2024-11-13 NOTE — Patient Instructions (Signed)
 It was nice to see you today.   I am glad that you are feeling better!   Continue with PT for your lower back.   Dr. Clois will see you back in 6 months.  Please call with any questions or concerns.   Glade Boys PA-C [phone]     The physicians and staff at [facility] at [facility] are committed to providing excellent care. You may receive a survey asking for feedback about your experience at our office. We value you your feedback and appreciate you taking the time to to fill it out. The [facility] leadership team is also available to discuss your experience in person, feel free to contact us  [phone].

## 2025-05-14 ENCOUNTER — Ambulatory Visit: Admitting: Neurosurgery
